# Patient Record
Sex: Female | Born: 1950 | Race: Asian | Hispanic: No | Marital: Married | Smoking: Never smoker
Health system: Southern US, Community
[De-identification: ages and names within clinical notes are randomized; demographics above are authoritative.]

## PROBLEM LIST (undated history)

## (undated) DIAGNOSIS — M8580 Other specified disorders of bone density and structure, unspecified site: Secondary | ICD-10-CM

## (undated) DIAGNOSIS — G5603 Carpal tunnel syndrome, bilateral upper limbs: Secondary | ICD-10-CM

## (undated) DIAGNOSIS — M545 Low back pain, unspecified: Secondary | ICD-10-CM

## (undated) DIAGNOSIS — N289 Disorder of kidney and ureter, unspecified: Secondary | ICD-10-CM

## (undated) DIAGNOSIS — D649 Anemia, unspecified: Secondary | ICD-10-CM

## (undated) DIAGNOSIS — H269 Unspecified cataract: Secondary | ICD-10-CM

## (undated) DIAGNOSIS — K589 Irritable bowel syndrome without diarrhea: Secondary | ICD-10-CM

## (undated) DIAGNOSIS — M1990 Unspecified osteoarthritis, unspecified site: Secondary | ICD-10-CM

## (undated) DIAGNOSIS — I10 Essential (primary) hypertension: Secondary | ICD-10-CM

## (undated) DIAGNOSIS — K219 Gastro-esophageal reflux disease without esophagitis: Secondary | ICD-10-CM

## (undated) DIAGNOSIS — N390 Urinary tract infection, site not specified: Secondary | ICD-10-CM

## (undated) DIAGNOSIS — I708 Atherosclerosis of other arteries: Secondary | ICD-10-CM

## (undated) DIAGNOSIS — T7840XA Allergy, unspecified, initial encounter: Secondary | ICD-10-CM

## (undated) DIAGNOSIS — K449 Diaphragmatic hernia without obstruction or gangrene: Secondary | ICD-10-CM

## (undated) DIAGNOSIS — I872 Venous insufficiency (chronic) (peripheral): Secondary | ICD-10-CM

## (undated) HISTORY — DX: Irritable bowel syndrome, unspecified: K58.9

## (undated) HISTORY — DX: Low back pain, unspecified: M54.50

## (undated) HISTORY — DX: Gastro-esophageal reflux disease without esophagitis: K21.9

## (undated) HISTORY — DX: Unspecified osteoarthritis, unspecified site: M19.90

## (undated) HISTORY — DX: Atherosclerosis of other arteries: I70.8

## (undated) HISTORY — DX: Venous insufficiency (chronic) (peripheral): I87.2

## (undated) HISTORY — DX: Diaphragmatic hernia without obstruction or gangrene: K44.9

## (undated) HISTORY — DX: Unspecified cataract: H26.9

## (undated) HISTORY — DX: Low back pain: M54.5

## (undated) HISTORY — DX: Other specified disorders of bone density and structure, unspecified site: M85.80

## (undated) HISTORY — PX: ABDOMINAL HYSTERECTOMY: SHX81

## (undated) HISTORY — DX: Urinary tract infection, site not specified: N39.0

## (undated) HISTORY — DX: Allergy, unspecified, initial encounter: T78.40XA

## (undated) HISTORY — DX: Carpal tunnel syndrome, bilateral upper limbs: G56.03

## (undated) HISTORY — DX: Essential (primary) hypertension: I10

## (undated) HISTORY — PX: OTHER SURGICAL HISTORY: SHX169

## (undated) HISTORY — DX: Anemia, unspecified: D64.9

---

## 1997-05-02 HISTORY — PX: BREAST REDUCTION SURGERY: SHX8

## 1998-06-02 ENCOUNTER — Other Ambulatory Visit: Admission: RE | Admit: 1998-06-02 | Discharge: 1998-06-02 | Payer: Self-pay | Admitting: *Deleted

## 1999-07-01 ENCOUNTER — Other Ambulatory Visit: Admission: RE | Admit: 1999-07-01 | Discharge: 1999-07-01 | Payer: Self-pay | Admitting: *Deleted

## 1999-07-05 ENCOUNTER — Other Ambulatory Visit: Admission: RE | Admit: 1999-07-05 | Discharge: 1999-07-05 | Payer: Self-pay | Admitting: *Deleted

## 1999-07-05 ENCOUNTER — Encounter (INDEPENDENT_AMBULATORY_CARE_PROVIDER_SITE_OTHER): Payer: Self-pay | Admitting: Specialist

## 2000-06-29 ENCOUNTER — Other Ambulatory Visit: Admission: RE | Admit: 2000-06-29 | Discharge: 2000-06-29 | Payer: Self-pay | Admitting: *Deleted

## 2001-07-06 ENCOUNTER — Other Ambulatory Visit: Admission: RE | Admit: 2001-07-06 | Discharge: 2001-07-06 | Payer: Self-pay | Admitting: *Deleted

## 2001-11-08 ENCOUNTER — Ambulatory Visit (HOSPITAL_COMMUNITY): Admission: RE | Admit: 2001-11-08 | Discharge: 2001-11-08 | Payer: Self-pay | Admitting: Pulmonary Disease

## 2001-11-08 ENCOUNTER — Encounter: Payer: Self-pay | Admitting: Pulmonary Disease

## 2002-07-31 ENCOUNTER — Other Ambulatory Visit: Admission: RE | Admit: 2002-07-31 | Discharge: 2002-07-31 | Payer: Self-pay | Admitting: Obstetrics and Gynecology

## 2003-12-17 ENCOUNTER — Encounter: Payer: Self-pay | Admitting: Gastroenterology

## 2004-03-16 ENCOUNTER — Ambulatory Visit: Payer: Self-pay | Admitting: Gastroenterology

## 2004-11-03 ENCOUNTER — Encounter (INDEPENDENT_AMBULATORY_CARE_PROVIDER_SITE_OTHER): Payer: Self-pay | Admitting: Specialist

## 2004-11-03 ENCOUNTER — Observation Stay (HOSPITAL_COMMUNITY): Admission: RE | Admit: 2004-11-03 | Discharge: 2004-11-04 | Payer: Self-pay | Admitting: Obstetrics and Gynecology

## 2004-11-30 ENCOUNTER — Ambulatory Visit: Payer: Self-pay | Admitting: Pulmonary Disease

## 2004-12-22 ENCOUNTER — Ambulatory Visit: Payer: Self-pay | Admitting: Pulmonary Disease

## 2005-02-09 ENCOUNTER — Ambulatory Visit: Payer: Self-pay | Admitting: Pulmonary Disease

## 2005-04-18 ENCOUNTER — Ambulatory Visit: Payer: Self-pay | Admitting: Pulmonary Disease

## 2005-06-21 ENCOUNTER — Ambulatory Visit (HOSPITAL_BASED_OUTPATIENT_CLINIC_OR_DEPARTMENT_OTHER): Admission: RE | Admit: 2005-06-21 | Discharge: 2005-06-21 | Payer: Self-pay | Admitting: Orthopedic Surgery

## 2005-07-25 ENCOUNTER — Ambulatory Visit: Payer: Self-pay | Admitting: Pulmonary Disease

## 2005-11-18 ENCOUNTER — Ambulatory Visit: Payer: Self-pay | Admitting: Pulmonary Disease

## 2006-05-09 ENCOUNTER — Ambulatory Visit: Payer: Self-pay | Admitting: Pulmonary Disease

## 2006-05-25 ENCOUNTER — Ambulatory Visit: Payer: Self-pay | Admitting: Internal Medicine

## 2006-06-13 ENCOUNTER — Ambulatory Visit: Payer: Self-pay | Admitting: Internal Medicine

## 2006-07-31 ENCOUNTER — Ambulatory Visit: Payer: Self-pay | Admitting: Internal Medicine

## 2006-10-25 ENCOUNTER — Ambulatory Visit: Payer: Self-pay | Admitting: Pulmonary Disease

## 2006-10-26 ENCOUNTER — Ambulatory Visit: Payer: Self-pay | Admitting: Pulmonary Disease

## 2006-10-26 LAB — CONVERTED CEMR LAB
ALT: 23 units/L (ref 0–35)
AST: 27 units/L (ref 0–37)
Albumin: 4 g/dL (ref 3.5–5.2)
Alkaline Phosphatase: 60 units/L (ref 39–117)
BUN: 14 mg/dL (ref 6–23)
Basophils Absolute: 0 10*3/uL (ref 0.0–0.1)
Basophils Relative: 1 % (ref 0.0–1.0)
Bilirubin, Direct: 0.1 mg/dL (ref 0.0–0.3)
CO2: 31 meq/L (ref 19–32)
Calcium: 9.4 mg/dL (ref 8.4–10.5)
Chloride: 109 meq/L (ref 96–112)
Cholesterol: 204 mg/dL (ref 0–200)
Creatinine, Ser: 0.7 mg/dL (ref 0.4–1.2)
Direct LDL: 122 mg/dL
Eosinophils Absolute: 0.1 10*3/uL (ref 0.0–0.6)
Eosinophils Relative: 3 % (ref 0.0–5.0)
GFR calc Af Amer: 112 mL/min
GFR calc non Af Amer: 93 mL/min
Glucose, Bld: 101 mg/dL — ABNORMAL HIGH (ref 70–99)
HCT: 41.2 % (ref 36.0–46.0)
HDL: 47.6 mg/dL (ref >39.0)
Hemoglobin: 14 g/dL (ref 12.0–15.0)
Lymphocytes Relative: 40.9 % (ref 12.0–46.0)
MCHC: 34.1 g/dL (ref 30.0–36.0)
MCV: 92.5 fL (ref 78.0–100.0)
Monocytes Absolute: 0.3 10*3/uL (ref 0.2–0.7)
Monocytes Relative: 7.8 % (ref 3.0–11.0)
Neutro Abs: 1.8 10*3/uL (ref 1.4–7.7)
Neutrophils Relative %: 47.3 % (ref 43.0–77.0)
Platelets: 202 10*3/uL (ref 150–400)
Potassium: 4.5 meq/L (ref 3.5–5.1)
RBC: 4.45 M/uL (ref 3.87–5.11)
RDW: 12 % (ref 11.5–14.6)
Sodium: 144 meq/L (ref 135–145)
TSH: 0.81 microintl units/mL (ref 0.35–5.50)
Total Bilirubin: 1.2 mg/dL (ref 0.3–1.2)
Total CHOL/HDL Ratio: 4.3
Total Protein: 7.1 g/dL (ref 6.0–8.3)
Triglycerides: 88 mg/dL (ref 0–149)
VLDL: 18 mg/dL (ref 0–40)
WBC: 3.8 10*3/uL — ABNORMAL LOW (ref 4.5–10.5)

## 2007-02-12 DIAGNOSIS — D649 Anemia, unspecified: Secondary | ICD-10-CM

## 2007-02-12 DIAGNOSIS — K219 Gastro-esophageal reflux disease without esophagitis: Secondary | ICD-10-CM

## 2007-02-12 DIAGNOSIS — K449 Diaphragmatic hernia without obstruction or gangrene: Secondary | ICD-10-CM

## 2007-02-12 DIAGNOSIS — I872 Venous insufficiency (chronic) (peripheral): Secondary | ICD-10-CM

## 2007-02-12 DIAGNOSIS — I10 Essential (primary) hypertension: Secondary | ICD-10-CM

## 2007-04-24 ENCOUNTER — Ambulatory Visit: Payer: Self-pay | Admitting: Pulmonary Disease

## 2007-04-24 DIAGNOSIS — K589 Irritable bowel syndrome without diarrhea: Secondary | ICD-10-CM

## 2007-04-24 DIAGNOSIS — K649 Unspecified hemorrhoids: Secondary | ICD-10-CM | POA: Insufficient documentation

## 2007-07-04 ENCOUNTER — Ambulatory Visit: Payer: Self-pay | Admitting: Pulmonary Disease

## 2007-07-04 LAB — CONVERTED CEMR LAB
CRP, High Sensitivity: 1 (ref 0.00–5.00)
TSH: 0.67 microintl units/mL (ref 0.35–5.50)

## 2007-07-16 ENCOUNTER — Telehealth (INDEPENDENT_AMBULATORY_CARE_PROVIDER_SITE_OTHER): Payer: Self-pay | Admitting: *Deleted

## 2007-08-28 ENCOUNTER — Telehealth: Payer: Self-pay | Admitting: Pulmonary Disease

## 2007-09-14 ENCOUNTER — Emergency Department (HOSPITAL_COMMUNITY): Admission: EM | Admit: 2007-09-14 | Discharge: 2007-09-14 | Payer: Self-pay | Admitting: Emergency Medicine

## 2007-10-17 ENCOUNTER — Ambulatory Visit: Payer: Self-pay | Admitting: Pulmonary Disease

## 2007-12-24 ENCOUNTER — Telehealth: Payer: Self-pay | Admitting: Internal Medicine

## 2007-12-26 ENCOUNTER — Encounter: Payer: Self-pay | Admitting: Pulmonary Disease

## 2008-01-09 ENCOUNTER — Ambulatory Visit: Payer: Self-pay | Admitting: Internal Medicine

## 2008-01-09 DIAGNOSIS — R1013 Epigastric pain: Secondary | ICD-10-CM

## 2008-01-09 DIAGNOSIS — K3189 Other diseases of stomach and duodenum: Secondary | ICD-10-CM

## 2008-01-25 ENCOUNTER — Ambulatory Visit (HOSPITAL_BASED_OUTPATIENT_CLINIC_OR_DEPARTMENT_OTHER): Admission: RE | Admit: 2008-01-25 | Discharge: 2008-01-25 | Payer: Self-pay | Admitting: Orthopedic Surgery

## 2008-02-05 ENCOUNTER — Ambulatory Visit: Payer: Self-pay | Admitting: Internal Medicine

## 2008-02-06 ENCOUNTER — Encounter: Payer: Self-pay | Admitting: Pulmonary Disease

## 2008-04-17 ENCOUNTER — Ambulatory Visit: Payer: Self-pay | Admitting: Pulmonary Disease

## 2008-07-31 HISTORY — PX: OTHER SURGICAL HISTORY: SHX169

## 2008-10-16 ENCOUNTER — Ambulatory Visit: Payer: Self-pay | Admitting: Pulmonary Disease

## 2008-10-17 ENCOUNTER — Ambulatory Visit: Payer: Self-pay | Admitting: Pulmonary Disease

## 2008-10-20 LAB — CONVERTED CEMR LAB
ALT: 21 units/L (ref 0–35)
AST: 27 units/L (ref 0–37)
Albumin: 4 g/dL (ref 3.5–5.2)
Alkaline Phosphatase: 64 units/L (ref 39–117)
Basophils Relative: 0.5 % (ref 0.0–3.0)
CO2: 30 meq/L (ref 19–32)
Calcium: 9.4 mg/dL (ref 8.4–10.5)
Chloride: 104 meq/L (ref 96–112)
Eosinophils Relative: 2.1 % (ref 0.0–5.0)
Glucose, Bld: 96 mg/dL (ref 70–99)
HDL: 34.6 mg/dL — ABNORMAL LOW (ref >39.00)
Hemoglobin: 13.5 g/dL (ref 12.0–15.0)
Lymphs Abs: 1.5 10*3/uL (ref 0.7–4.0)
Monocytes Absolute: 0.3 10*3/uL (ref 0.1–1.0)
Neutro Abs: 1.6 10*3/uL (ref 1.4–7.7)
Sodium: 142 meq/L (ref 135–145)
Total Protein: 7.1 g/dL (ref 6.0–8.3)

## 2008-11-04 ENCOUNTER — Telehealth (INDEPENDENT_AMBULATORY_CARE_PROVIDER_SITE_OTHER): Payer: Self-pay | Admitting: *Deleted

## 2008-11-10 ENCOUNTER — Encounter: Payer: Self-pay | Admitting: Pulmonary Disease

## 2009-03-12 ENCOUNTER — Ambulatory Visit: Payer: Self-pay | Admitting: Pulmonary Disease

## 2009-04-01 HISTORY — PX: OTHER SURGICAL HISTORY: SHX169

## 2009-04-14 ENCOUNTER — Ambulatory Visit: Payer: Self-pay | Admitting: Pulmonary Disease

## 2009-04-14 ENCOUNTER — Encounter: Admission: RE | Admit: 2009-04-14 | Discharge: 2009-04-14 | Payer: Self-pay | Admitting: General Surgery

## 2009-04-14 DIAGNOSIS — M949 Disorder of cartilage, unspecified: Secondary | ICD-10-CM

## 2009-04-14 DIAGNOSIS — M899 Disorder of bone, unspecified: Secondary | ICD-10-CM | POA: Insufficient documentation

## 2009-06-03 ENCOUNTER — Ambulatory Visit: Payer: Self-pay | Admitting: Internal Medicine

## 2009-06-03 ENCOUNTER — Telehealth: Payer: Self-pay | Admitting: Pulmonary Disease

## 2009-06-08 ENCOUNTER — Telehealth: Payer: Self-pay | Admitting: Pulmonary Disease

## 2009-07-09 ENCOUNTER — Encounter (INDEPENDENT_AMBULATORY_CARE_PROVIDER_SITE_OTHER): Payer: Self-pay | Admitting: *Deleted

## 2009-07-09 ENCOUNTER — Ambulatory Visit: Payer: Self-pay | Admitting: Internal Medicine

## 2009-07-23 ENCOUNTER — Ambulatory Visit: Payer: Self-pay | Admitting: Internal Medicine

## 2009-07-28 ENCOUNTER — Telehealth: Payer: Self-pay | Admitting: Internal Medicine

## 2009-07-29 ENCOUNTER — Ambulatory Visit: Payer: Self-pay | Admitting: Pulmonary Disease

## 2009-07-29 ENCOUNTER — Telehealth (INDEPENDENT_AMBULATORY_CARE_PROVIDER_SITE_OTHER): Payer: Self-pay | Admitting: *Deleted

## 2009-07-30 ENCOUNTER — Ambulatory Visit: Payer: Self-pay | Admitting: Internal Medicine

## 2009-07-30 ENCOUNTER — Telehealth: Payer: Self-pay | Admitting: Internal Medicine

## 2009-08-01 LAB — CONVERTED CEMR LAB
Albumin: 4.5 g/dL (ref 3.5–5.2)
Alkaline Phosphatase: 68 units/L (ref 39–117)
BUN: 11 mg/dL (ref 6–23)
Basophils Absolute: 0 10*3/uL (ref 0.0–0.1)
CO2: 29 meq/L (ref 19–32)
Calcium: 9.6 mg/dL (ref 8.4–10.5)
Creatinine, Ser: 0.7 mg/dL (ref 0.4–1.2)
Glucose, Bld: 92 mg/dL (ref 70–99)
Hemoglobin: 13.5 g/dL (ref 12.0–15.0)
Lipase: 41 units/L (ref 11.0–59.0)
Lymphocytes Relative: 39.4 % (ref 12.0–46.0)
Monocytes Relative: 6.7 % (ref 3.0–12.0)
Neutro Abs: 2.2 10*3/uL (ref 1.4–7.7)
RBC: 4.35 M/uL (ref 3.87–5.11)
RDW: 11.5 % (ref 11.5–14.6)
Total Protein: 8 g/dL (ref 6.0–8.3)
Vit D, 25-Hydroxy: 44 ng/mL (ref 30–89)
WBC: 4.7 10*3/uL (ref 4.5–10.5)

## 2009-09-01 ENCOUNTER — Telehealth: Payer: Self-pay | Admitting: Internal Medicine

## 2009-09-17 ENCOUNTER — Telehealth (INDEPENDENT_AMBULATORY_CARE_PROVIDER_SITE_OTHER): Payer: Self-pay | Admitting: *Deleted

## 2009-10-08 ENCOUNTER — Telehealth: Payer: Self-pay | Admitting: Internal Medicine

## 2009-10-16 ENCOUNTER — Ambulatory Visit: Payer: Self-pay | Admitting: Cardiology

## 2009-10-26 ENCOUNTER — Ambulatory Visit: Payer: Self-pay | Admitting: Pulmonary Disease

## 2009-10-26 DIAGNOSIS — I708 Atherosclerosis of other arteries: Secondary | ICD-10-CM

## 2009-10-27 ENCOUNTER — Encounter: Payer: Self-pay | Admitting: Internal Medicine

## 2009-10-28 ENCOUNTER — Encounter: Payer: Self-pay | Admitting: Internal Medicine

## 2009-10-28 ENCOUNTER — Ambulatory Visit: Payer: Self-pay

## 2009-10-30 ENCOUNTER — Telehealth: Payer: Self-pay | Admitting: Internal Medicine

## 2009-12-02 ENCOUNTER — Encounter: Payer: Self-pay | Admitting: Internal Medicine

## 2009-12-02 ENCOUNTER — Telehealth: Payer: Self-pay | Admitting: Internal Medicine

## 2010-04-12 ENCOUNTER — Telehealth: Payer: Self-pay | Admitting: Internal Medicine

## 2010-04-21 ENCOUNTER — Ambulatory Visit: Payer: Self-pay | Admitting: Pulmonary Disease

## 2010-05-20 ENCOUNTER — Other Ambulatory Visit: Payer: Self-pay | Admitting: Obstetrics

## 2010-05-26 ENCOUNTER — Other Ambulatory Visit: Payer: Self-pay | Admitting: Obstetrics

## 2010-06-03 NOTE — Progress Notes (Signed)
Summary: MEDICATION  Phone Note Call from Patient Call back at [phone]   Caller: Patient Call For: NADEL Summary of Call: NEED TO TALK TO DR NADEL ABOUT SIDE EFFECTS FROM HER MEDICATION Initial call taken by: Rickard Patience,  June 03, 2009 12:05 PM  Follow-up for Phone Call        Pt states she had an implant in her left eye for cataracts, and now she states they think she has a cataract in her right eye. She states her eye MD stated for her to ask SN if her feels any of her meds could be contributing to her eye problems, because he feels she is to young to be having cataracts. Please advise. Carron Curie CMA  June 03, 2009 12:23 PM   Additional Follow-up for Phone Call Additional follow up Details #1::        called and spoke with pt---she stated that her vision is getting worse, blurry most of the time---she looked up the micardis online and this is one of the side effects of this med and she would like to change to avalide or something else.  she also stated that  she is taking the toprol and would like to know if she can take a stronger dose of the torol instead of taking another med??she stated that she will start with a BP log to follow her BP readings.please advise.  thanks Randell Loop CMA  June 04, 2009 10:38 AM     Additional Follow-up for Phone Call Additional follow up Details #2::    called and spoke with pt---she is aware of meds changed---from micardis to avalide 150/12.5 per SN---pt is aware that this has been sent to her pharmacy and she has upcoming appt with SN on march 30 and will bring in log of her BP at that time. Randell Loop CMA  June 04, 2009 11:32 AM   New/Updated Medications: AVALIDE 150-12.5 MG TABS (IRBESARTAN-HYDROCHLOROTHIAZIDE) take one tablet by mouth once daily Prescriptions: AVALIDE 150-12.5 MG TABS (IRBESARTAN-HYDROCHLOROTHIAZIDE) take one tablet by mouth once daily  #30 x 6   Entered by:   Randell Loop CMA   Authorized by:    Michele Mcalpine MD   Signed by:   Randell Loop CMA on 06/04/2009   Method used:   Electronically to        [facility] [address] [phone]* (retail)       [address]       [place], [place]  [address]       Ph: [phone] or [phone]       Fax: [phone]   RxID:   [id number]

## 2010-06-03 NOTE — Progress Notes (Signed)
Summary: mesenteric doppler results/restart Nexium  Medications Added NEXIUM 40 MG CPDR (ESOMEPRAZOLE MAGNESIUM) 1 by mouth once daily in AM 30 mins before breakfast       Phone Note Outgoing Call   Call placed by: Iva Boop MD, Clementeen Graham,  October 30, 2009 4:13 PM Summary of Call: I explained that Doppler US of mesenteric aa was ok she does not have the post-prandial burning abdominal pain now now she is having belching and tasting of bile or food pc to restart Nexium 40 mg daily and let me know how she is in one month will need to reassess weight also    New/Updated Medications: NEXIUM 40 MG CPDR (ESOMEPRAZOLE MAGNESIUM) 1 by mouth once daily in AM 30 mins before breakfast

## 2010-06-03 NOTE — Assessment & Plan Note (Signed)
Summary: 6 month return/mhh   Primary Care Provider:  Alroy Dust, MD   CC:  6 month ROV & review of mult medical problems....  History of Present Illness: 60 y/o asian woman here for a follow up visit... she has mult medical problems as noted below...    ~  July 29, 2009:  she reports a difficult 69mo- saw DrGessner for her chr upper abd discomfort & she reports Nexium w/o benefit, intol Dexilant (made her sleepy)... he did another EGD 3/11- mild gastritis, otherw neg, HPylori neg>> she will try Prilosec OTC vs Prevacid OTC (take Bid before meals)... she would like CAT scan to be sure there is nothing going on in there (CT neg x mild atherosclerosis, DDD). She also c/o mult side effects from her BP meds- intol Avalide w/ photosens reaction even thru her clothes, Micardis affected her vision, and Diovan caused constipation... for now we will try monotherapy w/ Metoprolol 25-50mg  /d (she has tol this xyrs)...   ~  October 26, 2009:  CT Abd 3/11 showed mild atherosclerotic changes in Ao, DDD L5-S1, NAD.Marland Kitchen. subseq CTA by DrGessner 6/11 showed atherosclerotic changes at origin of SMA & Celiac w/o obstruction, & mod dis at origin of IMA which is very diminutive... renal arts OK, Ao is nonaneurysmal, & otherw neg...  her chr abd symptoms sound functional & we discussed trial Align, Activia, Gluten free diet, etc... she indicates that DrGessner may refer her to [facility]...   ~  April 21, 2010:  she tells me that a [place] doctor via web site rec pureed organic cabbage (to strengthen the stomach lining) & she drinks this daily & is feeling better... they also suggested a puppy to help w/ her BP & she has a [place] mix named "Dia Sitter" now & she notes BP doing satis... she is back on Nexium daily & taking a round of Xifaxan per [employer]... we reviewed prev Scans and labs.   Current Problem List:  HYPERTENSION (ICD-401.9) - on TOPROL XL 50mg - 1/2 to 1 tab Daily; & INTOL to Avalide, Micardis, Diovan she says... BP  today on monotherapy = 110/64 & we discussed monitor BP at home & use Metoprolol 25-50mg /d... denies HA, fatigue, visual changes, CP, palipit, dizziness, syncope, dyspnea, edema, etc...   ATHEROSCLEROSIS OF OTHER SPECIFIED ARTERIES (ICD-440.8) - CTAngio 6/11 showed atherosclerotic changes at origin of SMA & Celiac w/o obstruction, mod dis at origin of IMA which is very diminutive, renal arts OK, Ao is nonaneurysmal, & otherw neg exam... we discussed ASA 81mg /d.  VENOUS INSUFFICIENCY (ICD-459.81) - she follows a low sodium diet, elevates legs, & wears support hose as needed...   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 now takes pureed organic cabbage per [place] medicine web site & improved.  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   ~  10/09 f/u w/ DrGessner- EGD showed 2cmHH, otherw neg- she was reassured.  ~  12/10: she remains on Nexium doing well...  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 repeat EGD by DrGessner showed mild gastritis, neg HPylori.  IRRITABLE BOWEL SYNDROME (ICD-564.1) & HEMORRHOIDS (ICD-455.6) - last colon 2/08 w/ small int hems only...   ~  12/10: she  had outpt hem surg by DrIngram...  HX, URINARY INFECTION (ICD-V13.02) - hx urinary symptoms and referred by GYN to Urology w/ cystoscopy 6/09 by DrPeterson= normal... she was tried on [employer] [facility]...   Hx of LOW BACK PAIN - she had prev epid steroid shots in 1996... CT Abd 3/11 showed some DDD in lumbar area.  Hx bilateral CARPAL TUNNEL SYNDROME - she had prev surgery on right- 2/07, and left- 9/09 by DrSypher...  OSTEOPENIA (ICD-733.90) - she takes Calcium, MVI, Vit D... followed by GYN.  ~  BMD 2010 at [facility] showed  TScores -1.8 in Spine, & -1.3 in right FemNeck.  Hx of ANEMIA-NOS (ICD-285.9)  ~  labs 5/09 by GYN showed Hg= 13.5  ~  labs here 6/10 showed Hg= 13.5  ~  labs here 3/11 showed Hg= 13.5   Current Medications (verified): 1)  Metoprolol Succinate 50 Mg  Tb24 (Metoprolol Succinate) .... Take 1/2 To 1 By Mouth Daily 2)  Calcium 500 Mg Tabs (Calcium) .... Take 1 Tablet By Mouth Once A Day 3)  Vitamin D 1000 Unit Tabs (Cholecalciferol) .... Take 1 Tablet By Mouth Once A Day 4)  Nexium 40 Mg Cpdr (Esomeprazole Magnesium) .Marland Kitchen.. 1 By Mouth Once Daily in Am 30 Mins Before Breakfast 5)  Xifaxan 550 Mg Tabs (Rifaximin) .Marland Kitchen.. 1 By Mouth Three Times A Day  Allergies (verified): 1)  ! Penicillin 2)  ! Macrodantin 3)  ! Septra 4)  ! Prevacid 5)  ! * Mult Reported Drug Intolerances>>> 6)  ! * Shellfish  Comments:  Nurse/Medical Assistant: The patient's medications and allergies were reviewed with the patient and were updated in the Medication and Allergy Lists. 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 Hx Bilateral CARPAL TUNNEL SYNDROME - she had prev surgery on right- 2/07 and left- 9/09 by DrSypher... ANEMIA-NOS (ICD-285.9)  Past Surgical History: S/P Hysterectomy S/P bilat carpal tunnel releases- left 9/09, right 2/07 by DrSypher S/P left cataract & lens 4/10 by DrMcCuen S/P hemorhroid surg 12/10 by Lurline Hare  Family History: Reviewed history from 01/09/2008 and no changes required. No FH of Colon Cancer:  Social History: Reviewed history from 01/09/2008 and no changes required. 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  Alcohol Use - no Illicit Drug Use - no Married, has children  Review of Systems      See HPI  The patient denies anorexia, fever, weight loss, weight gain, vision loss, decreased hearing, hoarseness, chest pain, syncope, dyspnea on exertion, peripheral edema, prolonged cough, headaches, hemoptysis, abdominal pain, melena, hematochezia, severe indigestion/heartburn, hematuria, incontinence, muscle weakness, suspicious skin lesions, transient blindness, difficulty walking, depression, unusual weight change, abnormal bleeding, enlarged lymph nodes, and angioedema.    Vital Signs:  Patient profile:   60 year old female Height:      61 inches Weight:      150.13 pounds BMI:     28.47 O2 Sat:      98 % on Room air Temp:     98.1 degrees F oral Pulse rate:   60 / minute BP sitting:   110 / 64  (left arm) Cuff size:   regular  Vitals Entered By: Boone Master CNA/MA (April 21, 2010 2:18 PM)  O2 Flow:  Room air  Physical Exam  Additional Exam:  WD, WN, 60 y/o F in NAD... GENERAL:  Alert & oriented; pleasant & cooperative... HEENT:  [place]/AT, EOM-wnl,  PERRLA, EACs-wax, TMs- not vis, NOSE-clear, THROAT-clear & wnl. NECK:  Supple w/ fairROM; no JVD; normal carotid impulses w/o bruits; no thyromegaly or nodules palpated; no lymphadenopathy. CHEST:  Clear w/o wheezing, rales, rhonchi... HEART:  Regular Rhythm; without murmurs/ rubs/ or gallops... ABDOMEN:  Soft & nontender; normal bowel sounds; no organomegaly or masses detected. EXT: without deformities or arthritic changes; no varicose veins/ +venous insuffic/ no edema. NEURO:  intact w/ no focal deficits noted DERM:  No lesions noted; no rash etc...    Impression & Recommendations:  Problem # 1:  HYPERTENSION (ICD-401.9) BP controlled on monotherapy + "puppy"... Her updated medication list for this problem includes:    Metoprolol Succinate 50 Mg Tb24 (Metoprolol succinate) .Marland Kitchen... Take 1/2 to 1  by mouth daily  Problem # 2:  ATHEROSCLEROSIS OF OTHER SPECIFIED ARTERIES (ICD-440.8) As noted>  reviewed w/ pt & rec for ECASA 81mg /d...  Problem # 3:  GERD (ICD-530.81) GI per DrGessner on Nexium & Xifaxan currently... Her updated medication list for this problem includes:    Nexium 40 Mg Cpdr (Esomeprazole magnesium) .Marland Kitchen... 1 by mouth once daily in am 30 mins before breakfast  Problem # 4:  HX, URINARY INFECTION (ICD-V13.02) Followed by DrPeterson & stable...  Problem # 5:  OSTEOPENIA (ICD-733.90) Followed by GYN...  Problem # 6:  OTHER PROBLEMS AS NOTED>>>  Complete Medication List: 1)  Metoprolol Succinate 50 Mg Tb24 (Metoprolol succinate) .... Take 1/2 to 1 by mouth daily 2)  Nexium 40 Mg Cpdr (Esomeprazole magnesium) .Marland Kitchen.. 1 by mouth once daily in am 30 mins before breakfast 3)  Calcium 500 Mg Tabs (Calcium) .... Take 1 tablet by mouth once a day 4)  Vitamin D 1000 Unit Tabs (Cholecalciferol) .... Take 1 tablet by mouth once a day  Patient Instructions: 1)  Today we updated your med list- see below.... 2)  Continue your current meds the same... 3)  Be sure to eliminate sweets & count your calories to aide in weight reduction.Marland KitchenMarland Kitchen 4)  Call for any problems.Marland KitchenMarland Kitchen 5)  Please schedule a follow-up appointment in 6 months, with fasting blood work on return.

## 2010-06-03 NOTE — Progress Notes (Signed)
Summary: call to patient re: CT results  Medications Added BUSPIRONE HCL 10 MG  TABS (BUSPIRONE HCL) 1/2 tab two times a day x 1 week then 1 tab two times a day ALIGN   CAPS (MISC INTESTINAL FLORA REGULAT) Take one capsule by mouth daily       Phone Note Outgoing Call   Summary of Call: I called re: mild gastritis on EGD path she still feels bloated and burping after meals on antacids and metoprolol She had a CT abd/pelvis this AM per Dr. Kriste Basque labs are ok I will call her after CT back Iva Boop MD, [facility]  July 30, 2009 11:55 AM   Follow-up for Phone Call        left message that I would call back tomorrow Iva Boop MD, [facility]  July 30, 2009 6:58 PM cell # [phone]   Additional Follow-up for Phone Call Additional follow up Details #1::        I reviewed CT results and normal labs from Dr. 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 will start buspirone and Align possible side effects discussed she is to call for appt and see me in 6-8 weeks Iva Boop MD, [facility]  July 31, 2009 5:37 PM     New/Updated Medications: BUSPIRONE HCL 10 MG  TABS (BUSPIRONE HCL) 1/2 tab two times a day x 1 week then 1 tab two times a day ALIGN   CAPS (MISC INTESTINAL FLORA REGULAT) Take one capsule by mouth daily Prescriptions: BUSPIRONE HCL 10 MG  TABS (BUSPIRONE HCL) 1/2 tab two times a day x 1 week then 1 tab two times a day  #60 x 2   Entered and Authorized by:   Iva Boop MD, [facility]   Signed by:   Iva Boop MD, FACG on 07/31/2009   Method used:   Electronically to        [facility] [address] [phone]* (retail)       [address]       [place], [place]  [address]       Ph: [phone] or [phone]       Fax: [phone]   RxID:   [id number]

## 2010-06-03 NOTE — Assessment & Plan Note (Signed)
Summary: NEXIUM IS NOT WORKING ANYMORE/YF    History of Present Illness Visit Type: Follow-up Visit Primary GI MD: Stan Head MD [facility] Primary Provider: Lalla Brothers Requesting Provider: n/a Chief Complaint: GERD History of Present Illness:   60 yo [place] woman with chronic dyspepsia and GERD symptos, maintained on PPI. She shaye Nexium is not effective anymore. She had hemorrhoid surgery Dec 15 Derrell Lolling) and went off Nexium x 5 days because she  was not eating. Her typical regimen was off, not exercising or doing yoga. She restarted workouts she is a bit better.  The problems she was aving when off Nexium and after restarting were belcing pc and she smelled the previously eaten food. she also felt like something caught in her thraot after eating and in epigastrium. Her husband ordered and she started taking organic plum balls 10/day and it also has calcium citrate, ? if better defecation  She ?'s if there is surgical cure for her problems.  She remains concerned about gastric cancer since a friend has had it and "alot of [place]ans get stomach cancer". EGD 2009 ok Was treated for H. pyloti years ago     GI Review of Systems    Reports acid reflux and  belching.      Denies abdominal pain, bloating, chest pain, dysphagia with liquids, dysphagia with solids, heartburn, loss of appetite, nausea, vomiting, vomiting blood, weight loss, and  weight gain.        Denies anal fissure, black tarry stools, change in bowel habit, constipation, diarrhea, diverticulosis, fecal incontinence, heme positive stool, hemorrhoids, irritable bowel syndrome, jaundice, light color stool, liver problems, rectal bleeding, and  rectal pain.    Current Medications (verified): 1)  Adult Aspirin Low Strength 81 Mg  Tbdp (Aspirin) .... Take 1 Tablet By Mouth Once A Day 2)  Metoprolol Succinate 50 Mg  Tb24 (Metoprolol Succinate) .... Take 1/2 Tab Once Daily 3)  Micardis Hct 40-12.5 Mg  Tabs (Telmisartan-Hctz)  .... Take 1 Tab Daily.Marland KitchenMarland Kitchen 4)  Fish Oil 1000 Mg  Caps (Omega-3 Fatty Acids) .Marland Kitchen.. 1 Cap Daily 5)  Nexium 40 Mg  Cpdr (Esomeprazole Magnesium) .... Take 1 Tablet By Mouth Once A Day... 6)  [facility] Calcium-600 600 Mg  Tabs (Calcium Carbonate) .... Take 2 Tablets Once Daily 7)  Multivitamins   Tabs (Multiple Vitamin) .... Take 1 Tablet By Mouth Once A Day  Allergies: 1)  ! Penicillin 2)  ! Macrodantin 3)  ! Septra 4)  ! Prevacid 5)  ! * Shellfish  Past History:  Past Medical History: Reviewed history from 04/14/2009 and no changes required. HYPERTENSION (ICD-401.9) VENOUS INSUFFICIENCY (ICD-459.81) HIATAL HERNIA (ICD-553.3) GERD (ICD-530.81) IRRITABLE BOWEL SYNDROME (ICD-564.1) HEMORRHOIDS (ICD-455.6) HX, URINARY INFECTION (ICD-V13.02) OSTEOPENIA (ICD-733.90) Hx of LOW BACK PAIN - she had prev epid steroid shots in 1996... Hx Bilateral CARPAL TUNNEL SYNDROME - she had prev surgery on right- 2/07 and left- 9/09 by DrSypher... ANEMIA-NOS (ICD-285.9)     Past Surgical History: Reviewed history from 04/14/2009 and no changes required. S/P Hysterectomy S/P bilat carpal tunnel releases- left 9/09, right 2/07 by DrSypher S/P left cataract & lens 4/10 by DrMcCuen S/P hemorroid surg 12/10 by Lurline Hare  Family History: Reviewed history from 01/09/2008 and no changes required. No FH of Colon Cancer:  Social History: Reviewed history from 01/09/2008 and no changes required. Occupation: [employer] Patient has never smoked.  Alcohol Use - no Illicit Drug Use - no Married, has children  Vital Signs:  Patient profile:   59  year old female Height:      61 inches Weight:      148.38 pounds BMI:     28.14 Pulse rate:   68 / minute Pulse rhythm:   regular BP sitting:   106 / 68  (left arm) Cuff size:   regular  Vitals Entered By: June McMurray CMA Duncan Dull) (June 03, 2009 11:01 AM)  Physical Exam  General:  Well developed, well nourished, Asian woman in no acute distress. Lungs:   Clear throughout to auscultation. Heart:  Regular rate and rhythm; no murmurs, rubs,  or bruits. Abdomen:  Soft, nontender and nondistended. No masses, hepatosplenomegaly or hernias noted. Normal bowel sounds. Psych:  Alert and cooperative. Normal mood and affect.   Impression & Recommendations:  Problem # 1:  DYSPEPSIA, CHRONIC (ICD-536.8) Assessment Deteriorated Functional dyspepsia info given and se was reassured that this is unlikely to represent cancer due to repetitive nature and lack of change in symptoms, plus she has had EGD 3 times with last in 2009, no weight loss (variable pattern without any trend). Could consider cecking to see if H. pylori was eradicated but would need to come off PPi x 2 weeks. Do not think EGd indicated at this tie.  Problem # 2:  GERD (ICD-530.81) Assessment: Deteriorated She has ?ed if surgery an option. Though she sounds like shehas GERD, coulld "just" be unctional dyspepsia and fundoplication would not help and likely would wosen that. continue PPi but to try Kapidex saples (aware of headace with Prevacid - but she wants to try) Would need to prove GERD with pH probeif she were to pursue surgery.  Problem # 3:  IRRITABLE BOWEL SYNDROME (ICD-564.1) Assessment: Comment Only Does not appear to be actibe with respect to bowels ut suspect functional upper GI sxs.  Patient Instructions: 1)  Samples given of Dexilant. 2)  Please call our office in one month to let us know if the Dexilant is working.  Ask to speak with Judeth Cornfield. 3)  Copy sent to : Alroy Dust, MD 4)  The medication list was reviewed and reconciled.  All changed / newly prescribed medications were explained.  A complete medication list was provided to the patient / caregiver.

## 2010-06-03 NOTE — Progress Notes (Signed)
Summary: meds - needs CT-A   Phone Note Call from Patient Call back at [phone]   Caller: Patient Call For: Dr. Leone Payor Reason for Call: Talk to Nurse Summary of Call: finished Sucralfate... symptoms are the same  Initial call taken by: Vallarie Mare,  October 08, 2009 8:56 AM  Follow-up for Phone Call        Left message for patient to call back Darcey Nora RN, [facility]  October 08, 2009 10:19 AM  She has finished the rx of sucracrafate.  She says this did not improve her symptoms at all.  She c/o burning sensation after meals in her stomach.   She is eating a lot of cabbage to "help with her stomach lining".  She wants to persue additional testing.  Please advise.   Follow-up by: Darcey Nora RN, CGRN,  October 08, 2009 1:45 PM  Additional Follow-up for Phone Call Additional follow up Details #1::        message left that I would call again Iva Boop MD, [facility]  October 08, 2009 4:08 PM   New Problems: WEIGHT LOSS (ICD-783.21) ABDOMINAL PAIN, UPPER (ICD-789.09)   Additional Follow-up for Phone Call Additional follow up Details #2::    spoke to her'still with post-prandial burning upper abdominal pain and losing weight has altered her eating I reviewed CT with radiologist and mesenteric vessel origins look ok but not conclusive will order CT angio of abd/pelvis re: upper abdominal pain, weight loss she had BUN/creat ok 3/30  Additional Follow-up for Phone Call Additional follow up Details #3:: Details for Additional Follow-up Action Taken: message left to call back to schedule CT angio Additional Follow-up by: Teryl Lucy RN,  October 09, 2009 2:52 PM  New Problems: WEIGHT LOSS (ICD-783.21) ABDOMINAL PAIN, UPPER (ICD-789.09)   Patient  aware of CT angio scheduled for 10/16/09 1:00 at [facility] Darcey Nora RN, [facility]  October 12, 2009 10:50 AM

## 2010-06-03 NOTE — Assessment & Plan Note (Signed)
Summary: 20M DEXILANT F-UP/YF    History of Present Illness Visit Type: Follow-up Visit Primary GI MD: Stan Head MD [facility] Primary Provider: Alroy Dust, MD  Requesting Provider: n/a Chief Complaint: GERD  History of Present Illness:   60 yo Asain woman with chronic upper abdominal pain and discomfort. The Dexilant made her sleepy so she went back on Nexium but still has epigastric burning and fullness. Dexilant  did not help her abdominal pain either.On empty stomach it is sour, gassy. Avoiding spicy foods, on a bland diet but still symptomatic. She ?'s if she has H. pylori. she was treated for it in the past.   GI Review of Systems      Denies abdominal pain, acid reflux, belching, bloating, chest pain, dysphagia with liquids, dysphagia with solids, heartburn, loss of appetite, nausea, vomiting, vomiting blood, weight loss, and  weight gain.        Denies anal fissure, black tarry stools, change in bowel habit, constipation, diarrhea, diverticulosis, fecal incontinence, heme positive stool, hemorrhoids, irritable bowel syndrome, jaundice, light color stool, liver problems, rectal bleeding, and  rectal pain.    Current Medications (verified): 1)  Metoprolol Succinate 50 Mg  Tb24 (Metoprolol Succinate) .... Take 1/2 Tab Once Daily 2)  Diovan Hct 160-12.5 Mg Tabs (Valsartan-Hydrochlorothiazide) .... Take 1 Tablet By Mouth Once A Day 3)  Cvs Calcium-600 600 Mg  Tabs (Calcium Carbonate) .... Take 2 Tablets Once Daily 4)  Nexium 40 Mg Cpdr (Esomeprazole Magnesium) .... One Tablet By Mouth Once Daily  Allergies (verified): 1)  ! Penicillin 2)  ! Macrodantin 3)  ! Septra 4)  ! Prevacid 5)  ! * Shellfish  Past History:  Past Medical History: Reviewed history from 04/14/2009 and no changes required. HYPERTENSION (ICD-401.9) VENOUS INSUFFICIENCY (ICD-459.81) HIATAL HERNIA (ICD-553.3) GERD (ICD-530.81) IRRITABLE BOWEL SYNDROME (ICD-564.1) HEMORRHOIDS (ICD-455.6) HX, URINARY  INFECTION (ICD-V13.02) OSTEOPENIA (ICD-733.90) Hx of LOW BACK PAIN - she had prev epid steroid shots in 1996... Hx Bilateral CARPAL TUNNEL SYNDROME - she had prev surgery on right- 2/07 and left- 9/09 by DrSypher... ANEMIA-NOS (ICD-285.9)     Past Surgical History: S/P Hysterectomy S/P bilat carpal tunnel releases- left 9/09, right 2/07 by DrSypher S/P left cataract & lens 4/10 by DrMcCuen S/P hemorhroid surg 12/10 by Lurline Hare  Family History: Reviewed history from 01/09/2008 and no changes required. No FH of Colon Cancer:  Social History: Reviewed history from 01/09/2008 and no changes required. Occupation: [employer] Patient has never smoked.  Alcohol Use - no Illicit Drug Use - no Married, has children  Review of Systems       denies anxiety or stressors appetite ok weight down over time by about 1-0# in several mos  Vital Signs:  Patient profile:   60 year old female Height:      61 inches Weight:      146 pounds BMI:     27.69 BSA:     1.65 Pulse rate:   64 / minute Pulse rhythm:   regular BP sitting:   110 / 64  (left arm) Cuff size:   regular  Vitals Entered By: Ok Anis CMA (July 09, 2009 4:00 PM)  Physical Exam  General:  Well developed, well nourished, Asian woman in no acute distress.   Impression & Recommendations:  Problem # 1:  DYSPEPSIA, CHRONIC (ICD-536.8) Assessment Unchanged Dexilant did not help and was sleepy. Nexium not working. Some early satiety and some weight loss. Last EGD 2009. Will repeat to look for H.  pylori, ? development of neoplasia and hopefully to also reassure. / buspirone if EGD negative  Problem # 2:  GERD (ICD-530.81) Assessment: Unchanged on Nexium but ? if its GERD vs NUD.  Problem # 3:  EPIGASTRIC PAIN (ICD-789.06) Assessment: Unchanged  Orders: EGD (EGD) Risks, benefits,and indications of endoscopic procedure(s) were reviewed with the patient and all questions answered.  Patient Instructions: 1)   We will see you at your procedure on 07/23/09. 2)  Please hold your Nexium until your procedure. 3)  You may take 2 Tums (up to four times a day) as needed for heartburn. 4)  [facility] Patient Information Guide given to patient.  5)  Upper Endoscopy brochure given.  6)  The medication list was reviewed and reconciled.  All changed / newly prescribed medications were explained.  A complete medication list was provided to the patient / caregiver.

## 2010-06-03 NOTE — Assessment & Plan Note (Signed)
Summary: ROV/ MBW   Primary Care Provider:  Alroy Dust, MD   CC:  3 month ROV & review of medical problems....  History of Present Illness: 60 y/o oriental woman here for a follow up visit... she has mult medical problems as noted below...    ~  she had an eval from her GYN in May09- doing well... they did routine labs and Hg= 13.5;  Chem's all normal;  TSH= 0.83;  B12= 938;  VitD= 35... she was rec to take 1500mg  calcium and 1000 u VitD OTC...  ~  she had a Urologic eval from DrPeterson 6/09 w/ cystoscopy that she reports was neg- she had been referred by GYN to Urology, tried on Vesicare 10mg /d...  ~  Sep09:  she had left carpal tunnel release by DrSypher...  ~  Oct09:  she had GI f/u w/ DrGessner- she has a stomach cancer phobia (see below) and he did an EGD to reassure her- 2cmHH, otherw negative... she takes Nexium daily for reflux symptoms and this works well...  ~  Dec09:  doing satis now... she still wishes that there was some surg etc that would elim her acid so she didn't have to take the Nexium... feels well, BP well controlled...   ~  Jun10:  good 6 months- feeling well & Nexium working satis... occas queezy stomach but no pain, N/ V, reflux symptoms, etc... BP well controlled... had left cataract & lens by DrMcCuen in Apr10.  ~  Dec10:  she's had a good 6 months- sched for hem surg by DrIngram this week... otherw stable- no new complaints or concerns >> surg went well, no complic.   ~  July 29, 2009:  she reports a difficult 24mo- saw DrGessner for her chr upper abd discomfort & she reports Nexium w/o benefit, intol Dexilant (made her sleepy)... he did another EGD 3/11- mild gastritis, otherw neg, HPylori neg>> she will try Prilosec OTC vs Prevacid OTC (take Bid before meals)... she would like CAT scan to be sure there is nothing going on in there... She also c/o mult side effects from her BP meds- intol Avalide w/ photosens reaction even thru her clothes, Micardis affected her  vision, and Diovan caused constipation... for now we will try monotherapy w/ Metoprolol 25-50mg  /d (she has tol this xyrs)...   Current Problem List:  HYPERTENSION (ICD-401.9) - on TOPROL XL 50mg - 1/2 tabDaily; & INTOL to Avalide, Micardis, Diovan she says... BP today on monotherapy = 110/72 & we discussed monitor BP at home & use Metoprolol 25-50mg /d... denies HA, fatigue, visual changes, CP, palipit, dizziness, syncope, dyspnea, edema, etc...   VENOUS INSUFFICIENCY (ICD-459.81) - she follows a low sodium diet, elevates legs, & wears support hose as needed...   HIATAL HERNIA (ICD-553.3) & GERD (ICD-530.81) - **see above** she is very concerned about a friend who was diagnosed w/ gastric cancer- he apparently was a smoker but had no greater symptoms than she has had & she wonders about screening EGD's for early detection...  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   ~  10/09 f/u w/ DrGessner- EGD showed 2cmHH, otherw neg- she was reassured.  ~  12/10: she remains on Nexium doing well...  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 repeat EGD by DrGessner showed mild gastritis, neg HPylori.  IRRITABLE BOWEL SYNDROME (ICD-564.1) & HEMORRHOIDS (ICD-455.6) - last colon 2/08 w/ small int hems only...   ~  12/10: she had outpt  hem surg by DrIngram...  HX, URINARY INFECTION (ICD-V13.02)  Hx of LOW BACK PAIN - she had prev epid steroid shots in 1996...  Hx bilateral CARPAL TUNNEL SYNDROME - she had prev surgery on right- 2/07 and left- 9/09 by DrSypher...  OSTEOPENIA (ICD-733.90) - she takes Calcium, MVI, Vit D... followed by GYN.  ~  BMD 2010 at [facility] showed TScores -1.8 in Spine, & -1.3 in right FemNeck.  Hx of ANEMIA-NOS (ICD-285.9)  ~  labs 5/09 by GYN showed Hg= 13.5  ~  labs here 6/10 showed Hg= 13.5  ~  labs here 3/11 showed Hg=    Allergies: 1)  ! Penicillin 2)  ! Macrodantin 3)  ! Septra 4)  ! Prevacid 5)  ! * Mult Reported Drug  Intolerances>>> 6)  ! * Shellfish  Comments:  Nurse/Medical Assistant: The patient's medications and allergies were reviewed with the patient and were updated in the Medication and Allergy Lists.  Past History:  Past Medical History:  HYPERTENSION (ICD-401.9) VENOUS INSUFFICIENCY (ICD-459.81) HIATAL HERNIA (ICD-553.3) GERD (ICD-530.81) IRRITABLE BOWEL SYNDROME (ICD-564.1) HEMORRHOIDS (ICD-455.6) HX, URINARY INFECTION (ICD-V13.02) OSTEOPENIA (ICD-733.90) Hx of LOW BACK PAIN - she had prev epid steroid shots in 1996... Hx Bilateral CARPAL TUNNEL SYNDROME - she had prev surgery on right- 2/07 and left- 9/09 by DrSypher... ANEMIA-NOS (ICD-285.9)     Past Surgical History: S/P Hysterectomy S/P bilat carpal tunnel releases- left 9/09, right 2/07 by DrSypher S/P left cataract & lens 4/10 by DrMcCuen S/P hemorhroid surg 12/10 by Lurline Hare  Family History: Reviewed history from 01/09/2008 and no changes required. No FH of Colon Cancer:  Social History: Reviewed history from 01/09/2008 and no changes required. Occupation: [employer] Patient has never smoked.  Alcohol Use - no Illicit Drug Use - no Married, has children  Review of Systems      See HPI       The patient complains of abdominal pain.  The patient denies anorexia, fever, weight loss, weight gain, vision loss, decreased hearing, hoarseness, chest pain, syncope, dyspnea on exertion, peripheral edema, prolonged cough, headaches, hemoptysis, melena, hematochezia, severe indigestion/heartburn, hematuria, incontinence, muscle weakness, suspicious skin lesions, transient blindness, difficulty walking, depression, unusual weight change, abnormal bleeding, enlarged lymph nodes, and angioedema.    Vital Signs:  Patient profile:   60 year old female Height:      61 inches Weight:      146.13 pounds BMI:     27.71 O2 Sat:      99 % on Room air Temp:     97.1 degrees F oral Pulse rate:   67 / minute BP sitting:   110 /  72  (left arm) Cuff size:   regular  Vitals Entered By: Randell Loop CMA (July 29, 2009 12:00 PM)  O2 Sat at Rest %:  99 O2 Flow:  Room air CC: 3 month ROV & review of medical problems... Is Patient Diabetic? No Pain Assessment Patient in pain? no      Comments meds updated today   Physical Exam  Additional Exam:  WD, WN, 60 y/o F in NAD... GENERAL:  Alert & oriented; pleasant & cooperative... HEENT:  [place]/AT, EOM-wnl,  PERRLA, EACs-clear, TMs-wnl, NOSE-clear, THROAT-clear & wnl. NECK:  Supple w/ fairROM; no JVD; normal carotid impulses w/o bruits; no thyromegaly or nodules palpated; no lymphadenopathy. CHEST:  Clear w/o wheezing, rales, rhonchi... HEART:  Regular Rhythm; without murmurs/ rubs/ or gallops... ABDOMEN:  Soft & nontender; normal bowel sounds; no organomegaly or masses  detected. EXT: without deformities or arthritic changes; no varicose veins/ +venous insuffic/ no edema. NEURO:  intact w/ no focal deficits noted DERM:  No lesions noted; no rash etc...    MISC. Report  Procedure date:  07/29/2009  Findings:      We have ordered LABS & CT ABD & PELVIS... pending.  SN    Impression & Recommendations:  Problem # 1:  ? FUNCTIONAL GI COMPLAINTS>>> Extensive GI eval by DrGessner reviewed... mult somatic complaints, and she has a cancer phobia as noted... perhaps getting a CT abd will provide her w/ the last measure of reassurance that she needs... REC>  CT Abd/ Pelvis + f/u labs > we will call her w/ results. For her symptoms > try Prev15 vs Prilosec20 taken 30 min before meals, and Simethacone preps for gas...   Problem # 2:  HYPERTENSION (ICD-401.9) She stopped all the ARBs due to ?side effect? she thinks...  Taking Metop 50- 1/2 daily & watch BP at home... The following medications were removed from the medication list:    Diovan Hct 160-12.5 Mg Tabs (Valsartan-hydrochlorothiazide) .Marland Kitchen... Take 1 tablet by mouth once a day Her updated medication list for  this problem includes:    Metoprolol Succinate 50 Mg Tb24 (Metoprolol succinate) .Marland Kitchen... Take 1/2 tab once daily  Orders: TLB-BMP (Basic Metabolic Panel-BMET) (80048-METABOL) TLB-Hepatic/Liver Function Pnl (80076-HEPATIC) TLB-CBC Platelet - w/Differential (85025-CBCD) TLB-TSH (Thyroid Stimulating Hormone) (84443-TSH) TLB-Amylase (82150-AMYL) TLB-Lipase (83690-LIPASE) T-Vitamin D (25-Hydroxy) (60454-09811)  Problem # 3:  OSTEOPENIA (ICD-733.90) Osteop & hx LBP... stable, continue Vit D 1000 u daily...  Problem # 4:  OTHER MEDICAL PROBLEMS AS NOTED>>>  Complete Medication List: 1)  Metoprolol Succinate 50 Mg Tb24 (Metoprolol succinate) .... Take 1/2 tab once daily 2)  Buspirone Hcl 10 Mg Tabs (Buspirone hcl) .... 1/2 tab two times a day x 1 week then 1 tab two times a day 3)  Align Caps (Misc intestinal flora regulat) .... Take one capsule by mouth daily  Other Orders: Radiology Referral (Radiology)  Patient Instructions: 1)  We decided to treat your BP w METOPROLOL 50mg - 1/2 to 1 tab daily as monotherapy... stop the previous Diovan Rx. 2)  For your STOMACH:  try the OTC PPI therapy= PRILOSEC or PREVACID >> take one twice daily (30 min before meals is the best time to take these).Marland KitchenMarland Kitchen 3)  For the GAS:  use SIMETHACONE >> eg. MYLICON, Mylanta GAS, Bean-O, PHAZYME, etc... use it up to 4 timmes daily for gas symptoms.Marland KitchenMarland Kitchen 4)  Today we did your follow up blood work, and we will schedule a CT Scan of your Abdomen for further eval... 5)  We will call you with these results when avail.Marland KitchenMarland Kitchen 6)  Please schedule a follow-up appointment in 3-4 months.

## 2010-06-03 NOTE — Progress Notes (Signed)
Summary: prescript  Phone Note Call from Patient Call back at [phone]   Caller: Patient Call For: nadel Summary of Call: insurance need dr Kriste Basque to  authorized avalide not generic Initial call taken by: Rickard Patience,  June 08, 2009 10:27 AM  Follow-up for Phone Call        Avalide is on backorder and pt is requesting that this medication be changed. She is asking for Micardis. Please advise.Michel Bickers [facility]  June 08, 2009 11:16 AM  Additional Follow-up for Phone Call Additional follow up Details #1::        i called and spoke with pt---she stated that the pharmacy told her that the avalide was on back order and it needed a prior auth for approval from her insurance company.  the pharmacy told me that they did not know when they would be able to get it.  per SN   change the avalide to diovan hct 160/12.5mg  once daily and i called and spoke with pt and she is aware of the change in meds and voiced her understanding and she is aware of med change. Randell Loop CMA  June 08, 2009 2:25 PM     New/Updated Medications: DIOVAN HCT 160-12.5 MG TABS (VALSARTAN-HYDROCHLOROTHIAZIDE) Take 1 tablet by mouth once a day Prescriptions: DIOVAN HCT 160-12.5 MG TABS (VALSARTAN-HYDROCHLOROTHIAZIDE) Take 1 tablet by mouth once a day  #30 x 6   Entered by:   Randell Loop CMA   Authorized by:   Michele Mcalpine MD   Signed by:   Randell Loop CMA on 06/08/2009   Method used:   Electronically to        [facility] [address] [phone]* (retail)       [address]       [place], [place]  [address]       Ph: [phone] or [phone]       Fax: [phone]   RxID:   [id number]

## 2010-06-03 NOTE — Progress Notes (Signed)
Summary: Metoprolol RX  Phone Note From Pharmacy   Caller: [facility] [address] [phone]* Call For: nadel  Summary of Call: has a question about her furosmide needing refills due to increase in how pt is taking it [phone] Initial call taken by: Lacinda Axon,  Sep 17, 2009 11:09 AM  Follow-up for Phone Call        At last OV with SN on 07/29/2009 pt was instructed to stop Diovan and cahnge Metoprolol 50mg  to 1/2 ro 1 tab by mouth daily. Verbal RX given to pharmacist. They were asking for 90 day supply. I will update pt's med list to reflect the change that was made at last OV. Follow-up by: Michel Bickers CMA,  Sep 17, 2009 11:38 AM    New/Updated Medications: METOPROLOL SUCCINATE 50 MG  TB24 (METOPROLOL SUCCINATE) Take 1/2 to 1 by mouth daily Prescriptions: METOPROLOL SUCCINATE 50 MG  TB24 (METOPROLOL SUCCINATE) Take 1/2 to 1 by mouth daily  #90 x 3   Entered by:   Michel Bickers CMA   Authorized by:   Michele Mcalpine MD   Signed by:   Michel Bickers CMA on 09/17/2009   Method used:   Telephoned to ...       [facility] [address] [phone]* (retail)       [address]       [place], [place]  [address]       Ph: [phone] or [phone]       Fax: [phone]   RxID:   [id number]

## 2010-06-03 NOTE — Progress Notes (Signed)
Summary: Triage   Phone Note Call from Patient Call back at [phone]   Caller: Patient Call For: Dr. Leone Payor Reason for Call: Talk to Nurse Summary of Call: finished her Nexium med. and would like to discuss Initial call taken by: Karna Christmas,  December 02, 2009 2:54 PM  Follow-up for Phone Call        nexium has helped some.  Not completely better.  She has gained 2 lbs.  Eating yogurt and ice cream.   She is not completely better.  She is requesting a prev-pack or pylera , she knows that she was h. pylori negative, but she would like to try a tx anyway.  Please advise Follow-up by: Darcey Nora RN, CGRN,  December 02, 2009 3:04 PM  Additional Follow-up for Phone Call Additional follow up Details #1::        I am not going to do that. I would try Xifaxan....thought we had done that but do not see it on med list please doublecheck that Additional Follow-up by: Iva Boop MD, Clementeen Graham,  December 02, 2009 6:59 PM     Appended Document: Triage I can't see where you ever gave her xifaxan. Please advise Darcey Nora RN, [facility]  December 03, 2009 7:51 AM   Appended Document: Triage options now are Xifaxan 550 mg three times a day x 10 days or go to [facility] for eval  Appended Document: Triage patient aware, new rx sent.  patient will call back if xifaxan doesn't help for a [facility] referral.   Clinical Lists Changes  Medications: Added new medication of XIFAXAN 550 MG TABS (RIFAXIMIN) 1 by mouth three times a day - Signed Rx of XIFAXAN 550 MG TABS (RIFAXIMIN) 1 by mouth three times a day;  #30 x 0;  Signed;  Entered by: Darcey Nora RN, CGRN;  Authorized by: Iva Boop MD, FACG;  Method used: Electronically to [facility] [phone]*, [address], [place], [place], [place]  [address], Ph: [phone] or [phone], Fax: [phone]    Prescriptions: XIFAXAN 550 MG TABS (RIFAXIMIN) 1 by mouth three times a day  #30 x 0   Entered by:   Darcey Nora RN, CGRN   Authorized by:   Iva Boop MD, [facility]   Signed by:   Darcey Nora RN, CGRN on 12/04/2009   Method used:   Electronically to        [facility] [address] [phone]* (retail)       [address]       [place], [place]  [address]       Ph: [phone] or [phone]       Fax: [phone]   RxID:   [id number]

## 2010-06-03 NOTE — Progress Notes (Signed)
Summary: Xifaxan Rx  Medications Added XIFAXAN 550 MG TABS (RIFAXIMIN) 1 by mouth three times a day       Phone Note Other Incoming   Summary of Call: She has told me symptoms of bloating and post-prandial discomfort in abdomen have returned after resolution with Xifaxan in August. I have rerxed Xifaxan, please let her know. F/u as needed if that does not work again Iva Boop MD, [facility]  April 12, 2010 1:50 PM   Follow-up for Phone Call        Patient aware.  She is asked to call back if she has any further problems. Follow-up by: Darcey Nora RN, CGRN,  April 12, 2010 2:08 PM    New/Updated Medications: XIFAXAN 550 MG TABS (RIFAXIMIN) 1 by mouth three times a day Prescriptions: XIFAXAN 550 MG TABS (RIFAXIMIN) 1 by mouth three times a day  #30 x 0   Entered and Authorized by:   Iva Boop MD, [facility]   Signed by:   Iva Boop MD, [facility] on 04/12/2010   Method used:   Electronically to        [facility] [address] [phone]* (retail)       [address]       [place], [place]  [address]       Ph: [phone] or [phone]       Fax: [phone]   RxID:   [id number]

## 2010-06-03 NOTE — Progress Notes (Signed)
Summary: results request   Phone Note Call from Patient Call back at [phone]   Caller: Patient Call For: Dr. Leone Payor Reason for Call: Lab or Test Results Summary of Call: would like EGD results Initial call taken by: Vallarie Mare,  July 28, 2009 1:48 PM  Follow-up for Phone Call        Pt. aware that when Dr.Bodin Gorka reviews the path. report he will have [facility] mail pt. a letter with the results. Pt. instructed to call back as needed.  Follow-up by: Laureen Ochs LPN,  July 28, 2009 1:53 PM

## 2010-06-03 NOTE — Letter (Signed)
Summary: EGD Instructions  [facility]  [address] [place], [place] [address]   Phone: [phone]  Fax: [phone]       [name]    1950-06-04    MRN: [id number]       Procedure Day Dorna Bloom: Lenor Coffin, 07/23/09     Arrival Time: 2:00 PM     Procedure Time: 3:00 PM     Location of Procedure:                    _X_ [facility] (4th Floor)   PREPARATION FOR ENDOSCOPY   On THURSDAY, 07/23/09 THE DAY OF THE PROCEDURE:  1.   No solid foods, milk or milk products are allowed after midnight the night before your procedure.  2.   Do not drink anything colored red or purple.  Avoid juices with pulp.  No orange juice.  3.  You may drink clear liquids until 1:00 PM, which is 2 hours before your procedure.                                                                                                CLEAR LIQUIDS INCLUDE: Water Jello Ice Popsicles Tea (sugar ok, no milk/cream) Powdered fruit flavored drinks Coffee (sugar ok, no milk/cream) Gatorade Juice: apple, white grape, white cranberry  Lemonade Clear bullion, consomm, broth Carbonated beverages (any kind) Strained chicken noodle soup Hard Candy   MEDICATION INSTRUCTIONS  Unless otherwise instructed, you should take regular prescription medications with a small sip of water as early as possible the morning of your procedure.                 OTHER INSTRUCTIONS  You will need a responsible adult at least 59 years of age to accompany you and drive you home.   This person must remain in the waiting room during your procedure.  Wear loose fitting clothing that is easily removed.  Leave jewelry and other valuables at home.  However, you may wish to bring a book to read or an iPod/MP3 player to listen to music as you wait for your procedure to start.  Remove all body piercing jewelry and leave at home.  Total time from sign-in until discharge is approximately 2-3 hours.  You should  go home directly after your procedure and rest.  You can resume normal activities the day after your procedure.  The day of your procedure you should not:   Drive   Make legal decisions   Operate machinery   Drink alcohol   Return to work  You will receive specific instructions about eating, activities and medications before you leave.    The above instructions have been reviewed and explained to me by   _______________________    I fully understand and can verbalize these instructions _____________________________ Date _________

## 2010-06-03 NOTE — Progress Notes (Signed)
Summary: ct scan  Phone Note Call from Patient Call back at [phone]   Caller: Patient Call For: nadel Summary of Call: want to know if gall bladder can be checked when having ct scan tomorrow. Initial call taken by: Rickard Patience,  July 29, 2009 4:05 PM  Follow-up for Phone Call        called and spoke with pt.  pt scheduled for CT of abd and pelvis tomorrow.  informed her that her gallbladder will be included in the scan.  pt verbalized understanding. Arman Filter LPN  July 29, 2009 4:18 PM

## 2010-06-03 NOTE — Miscellaneous (Signed)
Summary: Orders Update  Clinical Lists Changes  Orders: Added new Test order of Mesenteric (Mesenteric) - Signed

## 2010-06-03 NOTE — Procedures (Signed)
Summary: Upper Endoscopy  Patient: [name] Note: All result statuses are Final unless otherwise noted.  Tests: (1) Upper Endoscopy (EGD)   EGD Upper Endoscopy       DONE     [facility]     [address]     [place], [place]  [address]           ENDOSCOPY PROCEDURE REPORT           PATIENT:  [name], [name]  MR#:  [id number]     BIRTHDATE:  1950-06-03, 58 yrs. old  GENDER:  female           ENDOSCOPIST:  Iva Boop, MD, [facility]           PROCEDURE DATE:  07/23/2009     PROCEDURE:  EGD with biopsy     ASA CLASS:  Class II     INDICATIONS:  epigastric pain           MEDICATIONS:   Fentanyl 50 mcg IV, Versed 6 mg IV     TOPICAL ANESTHETIC:  Exactacain Spray           DESCRIPTION OF PROCEDURE:   After the risks benefits and     alternatives of the procedure were thoroughly explained, informed     consent was obtained.  The LB GIF-H180 [id number] endoscope was     introduced through the mouth and advanced to the second portion of     the duodenum, without limitations.  The instrument was slowly     withdrawn as the mucosa was fully examined.     <<PROCEDUREIMAGES>>           Mild gastritis was found. Mottled, erythematous mucosa and red     spots. Multiple biopsies were obtained and sent to pathology.     Otherwise the examination was normal. Z-line at 40 cm.     Retroflexed views revealed no abnormalities.    The scope was then     withdrawn from the patient and the procedure completed.           COMPLICATIONS:  None           ENDOSCOPIC IMPRESSION:     1) Mild gastritis - biopsied     2) Otherwise normal examination     RECOMMENDATIONS:     1) Await biopsy results           REPEAT EXAM:  In for as needed.           Iva Boop, MD, [facility]           CC:  The Patient           n.     eSIGNED:   Iva Boop at 07/23/2009 03:33 PM           Ledell Noss, [id number]  Note: An exclamation mark (!) indicates a result that was not dispersed into the  flowsheet. Document Creation Date: 07/23/2009 3:34 PM _______________________________________________________________________  (1) Order result status: Final Collection or observation date-time: 07/23/2009 15:26 Requested date-time:  Receipt date-time:  Reported date-time:  Referring Physician:   Ordering Physician: Stan Head [phone]) Specimen Source:  Source: Launa Grill Order Number: [id number] Lab site:

## 2010-06-03 NOTE — Progress Notes (Signed)
Summary: discuss meds and testing  Medications Added NEXIUM 40 MG CPDR (ESOMEPRAZOLE MAGNESIUM) 1 by mouth once daily SUCRALFATE 1 GM TABS (SUCRALFATE) take 1 tablet 4 times a day before meals and at bedtime, may dissolve tablet in 1 tablespoon of water and drink as a slurry       Phone Note Call from Patient Call back at Work Phone [phone]   Caller: Patient Call For: Dr. Leone Payor Reason for Call: Talk to Nurse Summary of Call: would like to discuss medications before sch'ling f/u appt... also would like to discuss galbladder function test Initial call taken by: Vallarie Mare,  Sep 01, 2009 1:40 PM  Follow-up for Phone Call        no machine and no answer I will continue to try and reach the patient Darcey Nora RN, [facility]  Sep 01, 2009 2:10 PM  Patient  c/o burning sensation between her breast area and gas and belching.  Patient says that the buspirone hasn't changed her symptoms and she has made some dietary changes and no improvement.  She is asking for a gallbladder function test.  She doesn't feel her symptoms are anxiety.  Please advise. 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 she should restart Nexium at this time 40 mg daily but I will callher today or tomorrow also. Iva Boop MD, [facility]  Sep 02, 2009 1:46 PM     Additional Follow-up for Phone Call Additional follow up Details #2::    Left message for patient to call back Darcey Nora RN, [facility]  Sep 02, 2009 2:25 PM  Patient notified to resume Nexium and she will wait for your call. Follow-up by: Darcey Nora RN, CGRN,  Sep 03, 2009 1:42 PM  Additional Follow-up for Phone Call Additional follow up Details #3:: Details for Additional Follow-up Action Taken: she did not start Nexium and buspirone did not  help asking if she could have H. pylori despite negative bx Told her still sounds like functional dyspepsia dietary changes and reduced intake have led to weight loss she is down to #142 so far all studies without significant abnormalities 1) stop buspirone and will not start Nexium 2) start sucralfated 1 g qid 3) trial of Xifaxan for gas/bloat seems reasonable, will check for samples vs. Rx 4) also consider GI cokctail as needed depending upon response to sucralfate Iva Boop MD, [facility]  Sep 04, 2009 5:29 PM   New/Updated Medications: NEXIUM 40 MG CPDR (ESOMEPRAZOLE MAGNESIUM) 1 by mouth once daily SUCRALFATE 1 GM TABS (SUCRALFATE) take 1 tablet 4 times a day before meals and at bedtime, may dissolve tablet in 1 tablespoon of water and drink as a slurry Prescriptions: SUCRALFATE 1 GM TABS (SUCRALFATE) take 1 tablet 4 times a day before meals and at bedtime, may dissolve tablet in 1 tablespoon of water and drink as a slurry  #120 x 1   Entered and Authorized by:   Iva Boop MD, [facility]   Signed by:   Iva Boop MD, FACG on 09/04/2009   Method used:   Electronically to        [facility] [address] [phone]* (retail)       [address]       [place], [place]  [address]       Ph: [phone] or [phone]       Fax: [phone]   RxID:   [id number]

## 2010-06-03 NOTE — Assessment & Plan Note (Signed)
Summary: 3 months/ mbw   Primary Care Provider:  Alroy Dust, MD   CC:  3 month ROV & review....  History of Present Illness: 60 y/o asian woman here for a follow up visit... she has mult medical problems as noted below...    ~  Oct09:  she had GI f/u w/ DrGessner- she has a stomach cancer phobia (see below) and he did an EGD to reassure her- 2cmHH, otherw negative... she takes Nexium daily for reflux symptoms and this works well...  ~  Dec09:  doing satis now... she still wishes that there was some surg etc that would elim her acid so she didn't have to take the Nexium... feels well, BP well controlled...   ~  Jun10:  good 6 months- feeling well & Nexium working satis... occas queezy stomach but no pain, N/ V, reflux symptoms, etc... BP well controlled... had left cataract & lens by DrMcCuen in Apr10.  ~  Dec10:  she's had a good 6 months- sched for hem surg by DrIngram this week... otherw stable- no new complaints or concerns >> surg went well, no complic.   ~  July 29, 2009:  she reports a difficult 62mo- saw DrGessner for her chr upper abd discomfort & she reports Nexium w/o benefit, intol Dexilant (made her sleepy)... he did another EGD 3/11- mild gastritis, otherw neg, HPylori neg>> she will try Prilosec OTC vs Prevacid OTC (take Bid before meals)... she would like CAT scan to be sure there is nothing going on in there... She also c/o mult side effects from her BP meds- intol Avalide w/ photosens reaction even thru her clothes, Micardis affected her vision, and Diovan caused constipation... for now we will try monotherapy w/ Metoprolol 25-50mg  /d (she has tol this xyrs)...   ~  October 26, 2009:  CT Abd 3/11 showed mild atherosclerotic changes in Ao, DDD L5-S1, NAD.Marland Kitchen. subseq CTA by DrGessner 6/11 showed atherosclerotic changes at origin of SMA & Celiac w/o obstruction, & mod dis at origin of IMA which is very diminutive... renal arts OK, Ao is nonaneurysmal, & otherw neg...  her chr abd  symptoms sound functional & we discussed trial Align, Activia, Gluten free diet, etc... she indicates that DrGessner may refer her to [facility]...   Current Problem List:  HYPERTENSION (ICD-401.9) - on TOPROL XL 50mg - 1/2 to 1 tab Daily; & INTOL to Avalide, Micardis, Diovan she says... BP today on monotherapy = 110/64 & we discussed monitor BP at home & use Metoprolol 25-50mg /d... denies HA, fatigue, visual changes, CP, palipit, dizziness, syncope, dyspnea, edema, etc...   ATHEROSCLEROSIS OF OTHER SPECIFIED ARTERIES (ICD-440.8) - CTAngio 6/11 showed atherosclerotic changes at origin of SMA & Celiac w/o obstruction, mod dis at origin of IMA which is very diminutive, renal arts OK, Ao is nonaneurysmal, & otherw neg exam...  VENOUS INSUFFICIENCY (ICD-459.81) - she follows a low sodium diet, elevates legs, & wears support hose as needed...   HIATAL HERNIA (ICD-553.3) & GERD (ICD-530.81) - **see above** she is very concerned about a friend who was diagnosed w/ gastric cancer- he apparently was a smoker but had no greater symptoms than she has had & she wonders about screening for early detection...  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   ~  10/09 f/u w/ DrGessner- EGD showed 2cmHH, otherw neg- she was reassured.  ~  12/10: she remains on Nexium doing well...  ~  3/11:  states Nexium not working & Administrator, arts.Marland KitchenMarland Kitchen  repeat EGD by DrGessner showed mild gastritis, neg HPylori.  IRRITABLE BOWEL SYNDROME (ICD-564.1) & HEMORRHOIDS (ICD-455.6) - last colon 2/08 w/ small int hems only...   ~  12/10: she had outpt hem surg by DrIngram...  HX, URINARY INFECTION (ICD-V13.02) - hx urinary symptoms and referred by GYN to Urology w/ cystoscopy 6/09 by DrPeterson= normal... she was tried on [employer] Rx...   Hx of LOW BACK PAIN - she had prev epid steroid shots in 1996...  Hx bilateral CARPAL TUNNEL SYNDROME - she had prev surgery on right- 2/07, and left- 9/09 by DrSypher...  OSTEOPENIA (ICD-733.90)  - she takes Calcium, MVI, Vit D... followed by GYN.  ~  BMD 2010 at [facility] showed TScores -1.8 in Spine, & -1.3 in right FemNeck.  Hx of ANEMIA-NOS (ICD-285.9)  ~  labs 5/09 by GYN showed Hg= 13.5  ~  labs here 6/10 showed Hg= 13.5  ~  labs here 3/11 showed Hg= 13.5   Preventive Screening-Counseling & Management  Alcohol-Tobacco     Smoking Status: never  Allergies: 1)  ! Penicillin 2)  ! Macrodantin 3)  ! Septra 4)  ! Prevacid 5)  ! * Mult Reported Drug Intolerances>>> 6)  ! * Shellfish  Comments:  Nurse/Medical Assistant: The patient's medications and allergies were reviewed with the patient and were updated in the Medication and Allergy Lists.  Past History:  Past Medical History: HYPERTENSION (ICD-401.9) ATHEROSCLEROSIS OF OTHER SPECIFIED ARTERIES (ICD-440.8) VENOUS INSUFFICIENCY (ICD-459.81) HIATAL HERNIA (ICD-553.3) GERD (ICD-530.81) IRRITABLE BOWEL SYNDROME (ICD-564.1) HEMORRHOIDS (ICD-455.6) HX, URINARY INFECTION (ICD-V13.02) OSTEOPENIA (ICD-733.90) Hx of LOW BACK PAIN - she had prev epid steroid shots in 1996... Hx Bilateral CARPAL TUNNEL SYNDROME - she had prev surgery on right- 2/07 and left- 9/09 by DrSypher... ANEMIA-NOS (ICD-285.9)  Past Surgical History: S/P Hysterectomy S/P bilat carpal tunnel releases- left 9/09, right 2/07 by DrSypher S/P left cataract & lens 4/10 by DrMcCuen S/P hemorhroid surg 12/10 by Lurline Hare  Family History: Reviewed history from 01/09/2008 and no changes required. No FH of Colon Cancer:  Social History: Reviewed history from 01/09/2008 and no changes required. Occupation: [employer] Patient has never smoked.  Alcohol Use - no Illicit Drug Use - no Married, has children  Review of Systems      See HPI       The patient complains of abdominal pain and severe indigestion/heartburn.  The patient denies anorexia, fever, weight loss, weight gain, vision loss, decreased hearing, hoarseness, chest pain,  syncope, dyspnea on exertion, peripheral edema, prolonged cough, headaches, hemoptysis, melena, hematochezia, hematuria, incontinence, muscle weakness, suspicious skin lesions, transient blindness, difficulty walking, depression, unusual weight change, abnormal bleeding, enlarged lymph nodes, and angioedema.    Vital Signs:  Patient profile:   60 year old female Height:      61 inches Weight:      138 pounds BMI:     26.17 O2 Sat:      99 % on Room air Temp:     98.0 degrees F oral Pulse rate:   65 / minute BP sitting:   110 / 64  (left arm) Cuff size:   regular  Vitals Entered By: Randell Loop CMA (October 26, 2009 11:52 AM)  O2 Flow:  Room air CC: 3 month ROV & review... Is Patient Diabetic? No Pain Assessment Patient in pain? no      Comments rechecked BP--110/68 left with a different cuff   Physical Exam  Additional Exam:  WD, WN, 60 y/o F in  NAD.Marland KitchenMarland Kitchen GENERAL:  Alert & oriented; pleasant & cooperative... HEENT:  Media/AT, EOM-wnl,  PERRLA, EACs-wax, TMs- not vis, NOSE-clear, THROAT-clear & wnl. NECK:  Supple w/ fairROM; no JVD; normal carotid impulses w/o bruits; no thyromegaly or nodules palpated; no lymphadenopathy. CHEST:  Clear w/o wheezing, rales, rhonchi... HEART:  Regular Rhythm; without murmurs/ rubs/ or gallops... ABDOMEN:  Soft & nontender; normal bowel sounds; no organomegaly or masses detected. EXT: without deformities or arthritic changes; no varicose veins/ +venous insuffic/ no edema. NEURO:  intact w/ no focal deficits noted DERM:  No lesions noted; no rash etc...    MISC. Report  Procedure date:  10/26/2009  Findings:      DATA REVIEWED:  ~  Prev EMR notes & labs...  ~  CT Abd & Pelvis 07/30/09...  ~  CT Angio 10/16/09...   Impression & Recommendations:  Problem # 1:  HYPERTENSION (ICD-401.9) Controlled on the monotherapy... Her updated medication list for this problem includes:    Metoprolol Succinate 50 Mg Tb24 (Metoprolol succinate) .Marland Kitchen... Take  1/2 to 1 by mouth daily  Problem # 2:  ATHEROSCLEROSIS OF OTHER SPECIFIED ARTERIES (ICD-440.8) See CTA results... rec ASA 81mg /d, & primary risk reduction strategy...  Problem # 3:  DYSPEPSIA, CHRONIC (ICD-536.8) On-going eval by [employer]-  we discussed the nature of functional vs structural abnormalities in medicine... she has been using a juice made from organic cabbages, per her friend... we also discussed Align, Activia, & Gluten free diet...  Problem # 4:  HX, URINARY INFECTION (ICD-V13.02) Hx voiding prob & urinary symptoms... prev eval DrPeterson...  Problem # 5:  OTHER MEDICAL PROBLEMS AS NOTED>>>  Complete Medication List: 1)  Metoprolol Succinate 50 Mg Tb24 (Metoprolol succinate) .... Take 1/2 to 1 by mouth daily 2)  Calcium 500 Mg Tabs (Calcium) .... Take 1 tablet by mouth once a day 3)  Vitamin D 1000 Unit Tabs (Cholecalciferol) .... Take 1 tablet by mouth once a day  Patient Instructions: 1)  Today we updated your med list- see below.... 2)  We discussed trying several things to help your GI functional condition:  ALIGN probiotic, ACTIVIA yogurt, Gluten free diet, & let me know how the organic cabbage juice is working... 3)  Call for any problems.Marland KitchenMarland Kitchen 4)  Please schedule a follow-up appointment in 6 months.

## 2010-09-14 NOTE — Op Note (Signed)
[name], [name]                     ACCOUNT NO.:  [id number]   MEDICAL RECORD NO.:  [id number]          PATIENT TYPE:  AMB   LOCATION:  DSC                          FACILITY:  [facility]   PHYSICIAN:  Katy Fitch. Sypher, M.D. 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   OPERATION:  Release of left transcarpal ligament and release of left  long finger A1 pulley.   OPERATING SURGEON:  Katy Fitch. Sypher, MD   ASSISTANT:  Marveen Reeks Dasnoit, PA-C   ANESTHESIA:  General by LMA.   SUPERVISING ANESTHESIOLOGIST:  Bedelia Person, MD   INDICATIONS:  Mahika Vanvoorhis is a 60 year old woman well acquainted with  our practice.  She is status post prior right carpal tunnel release.  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   She is referred by Dr. Kriste Basque for followup evaluation of left hand  numbness and locking of her left long finger.  She has a history of  stenosing tenosynovitis and carpal tunnel syndrome unresponsive to  nonoperative measures.   After informed consent, she is brought to the operating room at this  time.  She is noted preoperatively to be allergic to PENICILLIN and  SULFA.  Her preoperative lab work also showed a borderline elevated  potassium.   PROCEDURE:  Lannie Heaps is brought to the operating room and placed in  supine position on the operating table.   Following the induction of general anesthesia by LMA technique, the left  arm was prepped with Betadine soap solution and sterilely draped.  A  pneumatic tourniquet was applied proximal of brachium.   On exsanguination  of left arm with an Esmarch bandage, arterial  tourniquet was inflated to 220 mmHg.  The procedure commenced with a  short incision in the line of the ring finger in the palm.  Subcutaneous  tissues were carefully divided.  The common sensory branch of the median  nerve was identified and followed back to the transcarpal ligament.  The  carpal canal was found with a Penfield 4 elevator followed by separation  of the median nerve from the deep surface of the transcarpal ligament.  The ligament was released along its ulnar border extending into the  distal forearm.   This widely opened the carpal canal.  No mass or other predicaments were  noted.   Bleeding points along the margin of the released ligament were  electrocauterized with bipolar current followed by repair of the skin  with intradermal 3-0 Prolene suture.   Attention was then directed to the left long finger.  The A1 pulley was  identified by palpation.  A short transverse incision was fashioned  directly over the pulley.  Subcutaneous tissues were carefully divided  taking care to gently retract the radial and ulnar neurovascular bundles  and the common digital vessels.  The pulley was split with scalpel and  the release extended with scissors proximally and distally.  The  profundus superficialis tendons were delivered and found be otherwise  healthy.   Full passive motion of fingers recovered without residual triggering.   This wound was repaired with intradermal 3-0 Prolene suture.   A compressive dressing was applied with Xeroflo sterile gauze and a  volar plaster splint to both protect the incision and immobilize the  wrist.   There were no apparent complications.      Katy Fitch Sypher, M.D.  Electronically Signed     RVS/MEDQ  D:  01/25/2008  T:  01/25/2008  Job:  [id number]   cc:   Lonzo Cloud. Kriste Basque, MD

## 2010-09-17 NOTE — Op Note (Signed)
NAMEJACOBI, [name]                     ACCOUNT NO.:  [id number]   MEDICAL RECORD NO.:  [id number]          PATIENT TYPE:  AMB   LOCATION:  DSC                          FACILITY:  [facility]   PHYSICIAN:  Katy Fitch. Sypher, M.D. DATE OF BIRTH:  02/10/1951   DATE OF PROCEDURE:  06/21/2005  DATE OF DISCHARGE:                                 OPERATIVE REPORT   PREOP DIAGNOSIS:  Bilateral carpal tunnel syndrome unresponsive to  nonoperative measures.   POSTOP DIAGNOSIS:  Bilateral carpal tunnel syndrome unresponsive to  nonoperative measures.   OPERATIONS:  Release of right transverse carpal ligament and injection of  left ulnar bursa.   OPERATING SURGEON:  Molly Maduro Sypher   ASSISTANT:  Molly Maduro Dasnoit PA-C.   ANESTHESIA:  General by LMA, supervising anesthesiologist is Dr. Gelene Mink.   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   She presented for evaluation of stenosing tenosynovitis and bilateral hand  numbness.   She had had prior steroid injection into her left ring finger by another  orthopedic surgeon and had had episodes of carpal tunnel syndrome, treated  with splinting and activity modification.   Clinical examination revealed signs of bilateral carpal tunnel syndrome.  Electrodiagnostic studies were completed by Dr. Johna Roles confirming  bilateral carpal tunnel syndrome right worse than left.   Due to failure respond to nonoperative measures, she is brought to the  operating at this time for release of her right transverse carpal ligament  and injection of her left ulnar bursa while under anesthesia.   PROCEDURE:  [name] is brought to the operating room and placed in  supine position on the table.   Following induction general anesthesia by LMA technique, the left arm was  prepped with alcohol and the ulnar bursa injected with the fingers in  position of flexion in line of the ring finger with a mixture of 1 mL of 1%  plain lidocaine and 1 mL of Depo-Medrol 40 milligrams per mL a total volume  of 1 mL of Depo-Medrol.   During injection the fingers extended. This appeared to be technically  satisfactory injection of the ulnar bursa.   Attention directed to the right hand.   The arm was prepped with Betadine soap solution, sterilely draped. A  pneumatic tourniquet was applied proximal right brachium.   Following exsanguination of right arm with an Esmarch bandage, the arterial  tourniquet was inflated to 220 mmHg. The procedure commenced with a short  incision in the line of the ring finger in the palm. The subcutaneous  tissues were carefully divided to reveal palmar fascia. This was split  longitudinally to reveal the common sensory branch of the median nerve.   These were followed back transverse carpal ligament which was carefully  isolated from the median nerve.   The ligament was released along its ulnar border extending into the distal  forearm. This widely opened carpal canal. No masses or other predicaments  were noted.   Bleeding points along the margin of the released ligament were  electrocauterized bipolar current followed by repair the skin with  intradermal 3-0 Prolene suture.   A compressive dressing applied with a volar plaster splint maintaining the  wrist in 5 degrees of dorsiflexion.      Katy Fitch Sypher, M.D.  Electronically Signed     RVS/MEDQ  D:  06/21/2005  T:  06/22/2005  Job:  [id number]

## 2010-09-17 NOTE — Assessment & Plan Note (Signed)
HEALTHCARE                         GASTROENTEROLOGY OFFICE NOTE   NAME:[name], [name]                          MRN:          [id number]  DATE:05/25/2006                            DOB:          1950-08-27    CHIEF COMPLAINT:  Abdominal pain, question needs Nexium.   HISTORY:  [name] is a 60 year old [place] woman who has previously been  cared for by Dr. Jarold Motto and asked to switch to me.  About a month ago  or so she was having a lot of crampy lower quadrant abdominal pain.  A  few months back or longer she noted a change in caliber of her bowels  with finger thin stools.  She had no trouble defecating and no bleeding  but the stools have changed.  She also complains of a lot of gas and  bloating and distention at times and once she passes flatus she feels  better but it can be quite painful.  It sounds like Dr. Kriste Basque, gave her  some Levsin SL to try and that has helped.  Overall, she is much better.  She denies weight loss, fever, or chills.  She is discussing her  previous problems with anemia which were fixed by her hysterectomy.  She  has had some ridging of the nails problems she went to a dermatologist  about.  She is also questioning whether she needs to continue to take  Nexium for heartburn issues.  EGD in the past has demonstrated a hiatal  hernia.  In the past she has also had H. pylori treated.  There has  never been any complicated reflux disease demonstrated on EGD.  A  colonoscopy in 2005, demonstrated some internal hemorrhoids but was  otherwise clear, that was in August, that was her first screening exam.   MEDICATIONS:  1. Calcium 1200 mg daily.  2. Vitamin C daily.  3. Aspirin 81 mg daily.  4. Nexium 40 mg daily.  5. Metoprolol 25 mg a day (half a 50 mg tablet).  6. Avalide 150/12.5 mg daily.   Note, in the past she has tried to stop Nexium but if she goes just a  day she gets terrible heartburn.   DRUG ALLERGIES:   PENICILLIN.   PAST MEDICAL HISTORY:  1. Hysterectomy for bleeding and anemia problems, Dr. Billy Coast.  2. Chronic recurrent urticaria.  3. History of migraine headaches, resolved after a hysterectomy.  4. Right carpal tunnel release.   FAMILY HISTORY:  Notable for strokes.   REVIEW OF SYSTEMS:  As above.   PHYSICAL EXAMINATION:  GENERAL:  A pleasant, well-developed, well-  nourished, [place] woman.  VITAL SIGNS:  Weight 151 pounds, pulse 64, blood pressure 112/70.  EYES:  Anicteric.  ABDOMEN:  Soft with some mild lower quadrant tenderness on the left  without organomegaly or mass.  RECTAL:  In the presence of female nursing staff, shows mildly tender  rectal exam.  No hemorrhoids, no mass.  Hemoccult negative brown stool.   ASSESSMENT:  1. Change in bowels with change in stool caliber, left lower quadrant  pain.  She thinks it may have started when she had some blood      pressure medications adjusted.  Perhaps it is related to adhesions      and her prior hysterectomy.  Colorectal neoplasia should be      excluded.  2. Gas symptoms.  She does eat a lot of cabbage and roughage and we      talked about that and a handout was given.  3. Gastroesophageal reflux disease, doing well.  Question whether she      really needs chronic proton pump inhibitor.  She has not had      complicated disease.  It would be reasonable to try to come off.   PLAN:  1. I have written out a taper for her Nexium to see if she can do      that.  If it is not successful, she should go back on that to treat      her chronic symptomatic GERD.  2. Continue Levsin SL as needed.  3. Try to change the amount of gas-forming foods.  4. Schedule colonoscopy to exclude colorectal neoplasia as a cause of      her change in bowel habits.   Further plans pending clinical course.   Risks, benefits, and indications of colonoscopy explained.  She  understands and agrees to proceed.     Iva Boop,  MD,FACG  Electronically Signed    CEG/MedQ  DD: 05/25/2006  DT: 05/25/2006  Job #: [id number]   cc:   Lonzo Cloud. Kriste Basque, MD

## 2010-09-17 NOTE — H&P (Signed)
[name], [name]                     ACCOUNT NO.:  [id number]   MEDICAL RECORD NO.:  [id number]          PATIENT TYPE:  AMB   LOCATION:  [facility]                           FACILITY:  [facility]   PHYSICIAN:  Lenoard Aden, M.D.DATE OF BIRTH:  November 13, 1950   DATE OF ADMISSION:  DATE OF DISCHARGE:                                HISTORY & PHYSICAL   CHIEF COMPLAINT:  Menometrorrhagia with secondary anemia.   HISTORY OF PRESENT ILLNESS:  The patient is a 60 year old Asian female G2 P2  with symptomatic anemia with a hemoglobin of 7.5, now supplemented with iron  up to a hemoglobin of 11 with irregular bleeding, symptomatic fibroids, and  desire for definitive therapy.  She has a medical history remarkable for  hypertension and is currently on Toprol, verapamil, and Nexium.   ALLERGIES:  She has allergies to PENICILLIN.   PAST SURGICAL HISTORY:  She has a noncontributory surgical history.   FAMILY HISTORY:  She has a noncontributory family history.   OBSTETRIC HISTORY:  She has an obstetric history remarkable for two  uncomplicated vaginal, three pregnancies, one abortion, and two  uncomplicated vaginal deliveries.  She has a history of a breast reduction  in 1999.   SOCIAL HISTORY:  She is a nonsmoker and nondrinker.  She denies domestic or  physical violence.   PHYSICAL EXAMINATION:  She is a well-developed, well-nourished Asian female  in no acute distress.  HEENT normal.  Lungs are clear.  Heart:  Regular  rhythm.  Abdomen is soft and nontender.  Pelvic exam reveals uterus to be 8-  10 week size and anteflexed.  No adnexal masses appreciated.  Bilateral  normal ovaries are appreciated by ultrasound.  Extremities reveal no cords.  Neurologic exam is nonfocal.   IMPRESSION:  Menometrorrhagia with secondary anemia, nonresponsive to  hormonal therapy with previous hemoglobin of 7.6, now on iron therapy.   PLAN:  Plan is to proceed with diagnostic laparoscopy, laparoscopically  assisted  vaginal hysterectomy, possible total abdominal hysterectomy,  ovarian conservation.  Risks of anesthesia, infection, bleeding, injury to  abdominal organs, and need for repair discussed with delayed versus  immediate complications to include bowel and bladder injury noted.  The  patient acknowledges and wishes to proceed.       RJT/MEDQ  D:  11/03/2004  T:  11/03/2004  Job:  [id number]   cc:   [facility]

## 2010-09-17 NOTE — Op Note (Signed)
[name], [name]                     ACCOUNT NO.:  [id number]   MEDICAL RECORD NO.:  [id number]          PATIENT TYPE:  AMB   LOCATION:  SDC                           [facility]   PHYSICIAN:  Lenoard Aden, M.D.DATE OF BIRTH:  1951-04-25   DATE OF PROCEDURE:  11/03/2004  DATE OF DISCHARGE:                                 OPERATIVE REPORT   PREOPERATIVE DIAGNOSES:  1.  Menometrorrhagia with secondary anemia.  2.  Symptomatic uterine fibroids.   POSTOPERATIVE DIAGNOSES:  1.  Menometrorrhagia with secondary anemia.  2.  Symptomatic uterine fibroids.  3.  Enterocele.  4.  Pelvic adhesions.   PROCEDURE:  LAVH, McCall culdoplasty, lysis of adhesions.   SURGEON:  Lenoard Aden, M.D.   ASSISTANT:  Chester Holstein. Earlene Plater, M.D.   ANESTHESIA:  General.   ESTIMATED BLOOD LOSS:  350 cc.   COMPLICATIONS:  None.   DRAINS:  Foley.   COUNTS:  Correct.   Patient to recovery in good condition.   SPECIMENS:  Uterus and cervix to pathology.   BRIEF OPERATIVE NOTE:  Being apprised of the risks of anesthesia, infection,  bleeding, injury to abdominal organs, and need for repair, the patient was  brought to the operating room, where she was administered a general  anesthetic without complications.  Prepped and draped in the usual sterile  fashion.  Catheterized, Foley catheter placed.  After achieving adequate  anesthesia, dilute Marcaine solution was placed in the area of the  infraumbilical incision after a Hulka tenaculum was placed per vagina.  At  this time, a Veress needle was placed.  Opening pressure of -2 was noted.  4  liters of CO2 were insufflated without difficulty.  Trocar placed  atraumatically.  Pictures taken.  Normal liver, gallbladder bed.  Normal  appendiceal area.  Bilateral tubal ligation sites were noted.  Bilateral  normal ovaries.  There were adhesions of the left sigmoid mesentery to the  left side, the left adnexa, and the left pelvic sidewall.  Two 5 mm  trocar  sites were placed in the midaxillary line suprapubicly, halfway between the  symphysis pubis and the umbilicus.  Using transillumination, dilute Marcaine  solution placed.  These adhesions on the left side were lysed sharply along  the pelvic sidewall.  Ureters were identified bilaterally, and the Harmonic  scalpel was used to divide the round ligaments bilaterally.  The tubo-  ovarian round ligament complexes were divided without difficulty.  Using the  minimum setting on the Harmonic scalpel, bladder flap was developed sharply  and skeletonized off the lower uterine segment.  The uterine vessels were  skeletonized bilaterally and noted to be pulsatile.  Ureters previously  noted during the process.  The cardinal and broad ligament complexes were  ligated using the Harmonic scalpel without difficulty.  Irrigation was  accomplished.  Good hemostasis was noted.  CO2 was released, and evidence  revealed no evidence of bleeding.  At this time, attention was turned to the  vaginal portion of the procedure, whereby the cervicovaginal junction was  circumscribed using pitressin  solution and circumscribed using  electrocautery.  Bladder flap was developed sharply and anterior cul-de-sac  entry made without difficulty.  Posterior cul-de-sac entry made sharply as  well.  Uterosacral ligament complexes were bilaterally clamped using a  Heaney clamp and suture ligated, transfixed to the vaginal cuff.  At this  time, ligature was used to transect the rest of the cardinal ligament  complex, and the uterine vessels were bilaterally divided using the  ligature.  At this time, good hemostasis was noted.  The cervix was  amputated without difficulty.  Minimal bleeding noted, and then the wedge-  shaped portion of the uterus was excised posteriorly and anteriorly.  Specimen was then collapsed and removed.  Good hemostasis was noted.  Before  closure of the vagina, McCall culdoplasty suture was placed  using a 0  Monocryl suture after identifying a posterior enterocele.  This was tied,  and good hemostasis was noted.  The vaginal cuff was then closed side-to-  side using a 0 Vicryl suture without difficulty.  Packing was placed.  Attention was turned to the abdominal portion of the procedure, whereby  there was a large amount of blood in the cul-de-sac.  This area was  irrigated, and all blood clots were subsequently removed.  There was a small  area of bleeding in the midportion of the vaginal cuff which was cauterized  using Kleppinger bipolar cautery.  Good hemostasis was noted.  There was  then an area along the lower pole of the left ovary which was found to be  bleeding.  This are was grasped using a tripolar cautery, and the lower pole  of the ovary was elevated and pulled medially.  Ureter was noted in the  midline.  Iliac vessels were noted to be pulsatile.  This area was  cauterized using the tripolar, and good hemostasis was noted.  Irrigation  was accomplished.  CO2 was released, and good hemostasis persisted at this  time.  At this time, good hemostasis was noted, and all instruments were  removed under direct visualization.  CO2 was released.  Abdominal incision  closed using 0 Vicryl.  Dermabond placed on all incisions.  The patient  tolerated the procedure well and was transferred to the recovery room in  good condition.       RJT/MEDQ  D:  11/03/2004  T:  11/03/2004  Job:  [id number]

## 2010-09-17 NOTE — Assessment & Plan Note (Signed)
[facility]                         GASTROENTEROLOGY OFFICE NOTE   NAME:[name], [name]                          MRN:          [id number]  DATE:07/31/2006                            DOB:          08-Jul-1950    CHIEF COMPLAINT:  Followup of reflux, abdominal pain.   Ms. Lacomb had a colonoscopy on June 13, 2006.  She had small internal  hemorrhoids, was otherwise negative.  Since that time, she did have a  spell of constipation and straining and saw a small amount of bright red  blood per rectum.  She tried to taper off Nexium, but her reflux came  back after an attempted taper, and she is taking it about every other  day, with control of her symptoms, plus diet modification.  Endoscopy in  2005 showed no evidence of Barrett's.  She had a small hiatal hernia.  She is concerned about the development of esophageal cancer, which is a  real but very rare risk, and she had a normal EGD with respect of the  mucosa, i.e. no Barrett's or other precancerous lesions in 2005.  She  has no dysphagia.  Levsin SL is working well for her intermittent left  lower quadrant pain which is probable irritable bowel versus adhesions  or both.  Her other medications are listed and reviewed  in the chart.   PHYSICAL EXAMINATION:  VITAL SIGNS:  Weight 153 pounds, pulse 80, blood  pressure 120/72.   PAST MEDICAL HISTORY:  See previous note May 25, 2006 for past  medical history plus above.   ASSESSMENT:  1. Gastroesophageal reflux disease.  Not complicated, but it is      symptomatic, she is using Nexium every other day which is fine.  2. Irritable bowel syndrome plus-minus adhesion pain, controlled by      Levsin and diet changes.   PLAN:  Continue as she is.  See me as needed.  She asked about needing a  repeat endoscopy, and I told her I did not advise that for any type of  screening or surveillance, as long as she had good control of her  symptoms.  She should follow  with Dr. Kriste Basque.  A colonoscopy in 10 years  is appropriate for screening, sooner if signs or symptoms warrant it.  Regarding her hemorrhoids, she should treat those symptomatically with  over the counter agents.  If they worsen, she should let me know.     Iva Boop, MD,FACG  Electronically Signed    CEG/MedQ  DD: 07/31/2006  DT: 07/31/2006  Job #: [id number]

## 2010-10-05 ENCOUNTER — Other Ambulatory Visit: Payer: Self-pay | Admitting: Pulmonary Disease

## 2010-10-12 ENCOUNTER — Other Ambulatory Visit: Payer: Self-pay | Admitting: Obstetrics

## 2010-10-22 ENCOUNTER — Ambulatory Visit: Payer: Self-pay | Admitting: Pulmonary Disease

## 2010-10-28 ENCOUNTER — Ambulatory Visit: Payer: Self-pay | Admitting: Pulmonary Disease

## 2010-11-01 ENCOUNTER — Other Ambulatory Visit: Payer: Self-pay | Admitting: Obstetrics

## 2010-11-30 ENCOUNTER — Ambulatory Visit (INDEPENDENT_AMBULATORY_CARE_PROVIDER_SITE_OTHER): Payer: BC Managed Care – PPO | Admitting: Pulmonary Disease

## 2010-11-30 ENCOUNTER — Encounter: Payer: Self-pay | Admitting: Pulmonary Disease

## 2010-11-30 DIAGNOSIS — K449 Diaphragmatic hernia without obstruction or gangrene: Secondary | ICD-10-CM

## 2010-11-30 DIAGNOSIS — M899 Disorder of bone, unspecified: Secondary | ICD-10-CM

## 2010-11-30 DIAGNOSIS — K219 Gastro-esophageal reflux disease without esophagitis: Secondary | ICD-10-CM

## 2010-11-30 DIAGNOSIS — I872 Venous insufficiency (chronic) (peripheral): Secondary | ICD-10-CM

## 2010-11-30 DIAGNOSIS — K589 Irritable bowel syndrome without diarrhea: Secondary | ICD-10-CM

## 2010-11-30 DIAGNOSIS — I708 Atherosclerosis of other arteries: Secondary | ICD-10-CM

## 2010-11-30 DIAGNOSIS — D649 Anemia, unspecified: Secondary | ICD-10-CM

## 2010-11-30 DIAGNOSIS — I10 Essential (primary) hypertension: Secondary | ICD-10-CM

## 2010-11-30 NOTE — Progress Notes (Signed)
Subjective:    Patient ID: [name], female    DOB: August 08, 1950, 60 y.o.   MRN: [id number]  HPI 60 y/o Asian woman here for a follow up visit... she has mult medical problems as noted below...   ~  July 29, 2009:  she reports a difficult 14mo- saw DrGessner for her chr upper abd discomfort & she reports Nexium w/o benefit, intol Dexilant (made her sleepy)... he did another EGD 3/11- mild gastritis, otherw neg, HPylori neg>> she will try Prilosec OTC vs Prevacid OTC (take Bid before meals)... she would like CAT scan to be sure there is nothing going on in there (CT neg x mild atherosclerosis, DDD). She also c/o mult side effects from her BP meds- intol Avalide w/ photosens reaction even thru her clothes, Micardis affected her vision, and Diovan caused constipation... for now we will try monotherapy w/ Metoprolol 25-50mg  /d (she has tol this xyrs)...  ~  October 26, 2009:  CT Abd 3/11 showed mild atherosclerotic changes in Ao, DDD L5-S1, NAD.Marland Kitchen. subseq CTA by DrGessner 6/11 showed atherosclerotic changes at origin of SMA & Celiac w/o obstruction, & mod dis at origin of IMA which is very diminutive... renal arts OK, Ao is nonaneurysmal, & otherw neg...  her chr abd symptoms sound functional & we discussed trial Align, Activia, Gluten free diet, etc... she indicates that DrGessner may refer her to [facility]...  ~  April 21, 2010:  she tells me that a [place] doctor via web site rec pureed organic cabbage (to strengthen the stomach lining) & she drinks this daily & is feeling better... they also suggested a puppy to help w/ her BP & she has a [place] mix named "Dia Sitter" now & she notes BP doing satis... she is back on Nexium daily & taking a round of Xifaxan per [employer]... we reviewed prev Scans and labs.  ~  November 30, 2010:  25mo ROV & she has stopped the organic cabbage drink "I got sick of it" & back to taking Nexium 40mg  about every other day she says;  BP controlled on meds & feeling well in general;  She  notes some left hip pain & has been trying yoga;  She also had GYN check 6/12 w/ labs done & she brought copies for our records> FLP=good; Chems=wnl, HgA1c=5.7, TSH=0.67, Atypia on PAP & DrTaavon is following;  They recommended Folic Acid daily...   Problem List:  HYPERTENSION (ICD-401.9) - on TOPROL XL 50mg /d; & INTOL to Avalide, Micardis, Diovan she says... BP today on monotherapy = 126/80 & similar at home... denies HA, fatigue, visual changes, CP, palipit, dizziness, syncope, dyspnea, edema, etc...   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 CTAngio 6/11 showed atherosclerotic changes at origin of SMA & Celiac w/o obstruction, mod dis at origin of IMA which is very diminutive, renal arts OK, Ao is nonaneurysmal, & otherw neg exam...  VENOUS INSUFFICIENCY (ICD-459.81) - she follows a low sodium diet, elevates legs, & wears support hose as needed...   HIATAL HERNIA (ICD-553.3) & GERD (ICD-530.81) - on NEXIUM 40mg  Qod or so... she has chr functional GI complaints <see above> she is very concerned about a friend who was diagnosed w/ gastric cancer- he apparently was a smoker but had no greater symptoms than she has had & she wonders about screening for early detection (see below)... She took pureed organic cabbage per [place] medicine web site for many months & felt improved, but she got sick of it &  restarted NEXIUM. 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  ~  10/09 f/u w/ DrGessner- EGD showed 2cmHH, otherw neg- she was reassured. ~  12/10: she remains on Nexium doing well... 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 repeat EGD by DrGessner showed mild gastritis, neg HPylori. ~  She subseq started pureed organic cabbage per [place] medical web site & felt better...  IRRITABLE BOWEL SYNDROME (ICD-564.1) & HEMORRHOIDS (ICD-455.6) - last colon 2/08 w/ small int hems only...  ~  12/10: she had outpt hem surg by DrIngram...  HX, URINARY  INFECTION (ICD-V13.02) - hx urinary symptoms and referred by GYN to Urology w/ cystoscopy 6/09 by DrPeterson= normal... she was tried on [employer] [facility]...   Hx of LOW BACK PAIN - she had prev epid steroid shots in 1996... CT Abd 3/11 showed some DDD in lumbar area. Left Hip Pain> she reports some left hip pain & she is doing Yoga w/ improvement... Hx bilateral CARPAL TUNNEL SYNDROME - she had prev surgery on right- 2/07, and left- 9/09 by DrSypher...  OSTEOPENIA (ICD-733.90) - she takes Calcium, MVI, Vit D... followed by GYN. ~  BMD 2010 at [facility] showed TScores -1.8 in Spine, & -1.3 in right [facility]. ~  She reports f/u BMD 7/12 at GYN office> results pending...  Hx of ANEMIA-NOS (ICD-285.9) ~  labs 5/09 by GYN showed Hg= 13.5 ~  labs here 6/10 showed Hg= 13.5 ~  labs here 3/11 showed Hg= 13.5   Past Surgical History  Procedure Date  . Abdominal hysterectomy   . Bilateral carpal tunnel release 01/2008 and 06/2005    Dr. Teressa Senter  . Left cataract and lens 07/2008    Dr. Charlotte Sanes  . Hemorrhoid sugery 04/2009    Dr. Derrell Lolling    Outpatient Encounter Prescriptions as of 11/30/2010  Medication Sig Dispense Refill  . calcium gluconate 500 MG tablet Take 500 mg by mouth daily.        . Cholecalciferol (VITAMIN D) 1000 UNITS capsule Take 1,000 Units by mouth daily.        Marland Kitchen esomeprazole (NEXIUM) 40 MG capsule Take 40 mg by mouth daily before breakfast.        . folic acid (FOLVITE) 1 MG tablet Take 1 mg by mouth daily.        . metoprolol (TOPROL-XL) 50 MG 24 hr tablet        . DISCONTD: metoprolol (TOPROL-XL) 50 MG 24 hr tablet TAKE 1/2 TO 1 TABLET BY MOUTH EVERY DAY  90 tablet  0    Allergies  Allergen Reactions  . Lansoprazole     REACTION: headaches  . Nitrofurantoin     REACTION: severe sob---causes flu like symptoms  . Penicillins     REACTION: rash  . Sulfamethoxazole W/Trimethoprim     Current Medications, Allergies, Past Medical History, Past Surgical History, Family  History, and Social History were reviewed in [employer] record.    Review of Systems    See HPI - all other systems neg except as noted... The patient denies anorexia, fever, weight loss, weight gain, vision loss, decreased hearing, hoarseness, chest pain, syncope, dyspnea on exertion, peripheral edema, prolonged cough, headaches, hemoptysis, abdominal pain, melena, hematochezia, severe indigestion/heartburn, hematuria, incontinence, muscle weakness, suspicious skin lesions, transient blindness, difficulty walking, depression, unusual weight change, abnormal bleeding, enlarged lymph nodes, and angioedema.    Objective:   Physical Exam     WD, WN, 60 y/o  F in NAD... GENERAL:  Alert & oriented; pleasant & cooperative... HEENT:  [place]/AT, EOM-wnl,  PERRLA, EACs-wax, TMs- not vis, NOSE-clear, THROAT-clear & wnl. NECK:  Supple w/ fairROM; no JVD; normal carotid impulses w/o bruits; no thyromegaly or nodules palpated; no lymphadenopathy. CHEST:  Clear w/o wheezing, rales, rhonchi... HEART:  Regular Rhythm; without murmurs/ rubs/ or gallops... ABDOMEN:  Soft & nontender; normal bowel sounds; no organomegaly or masses detected. EXT: without deformities or arthritic changes; no varicose veins/ +venous insuffic/ no edema. NEURO:  intact w/ no focal deficits noted DERM:  No lesions noted; no rash etc...   Assessment & Plan:   HBP>  Controlled on BBlocker, continue same...  Atherosclerotic changes in visceral arts on CTAngio in 2011>  rec to take ASA 81mg /d...  Venous Insuffic>  Aware- rec no salt, elevation, support hose, etc...  GI> HH, GERD, IBS, Hems>  Back on PPI rx & taking it Qod now; had colon 2008 w/ hems only...  GU> Hx UTIs, Hx voiding problems>  Prev eval by DrPeterson, Urology...  Ortho>  She is doing Yoga & improved...  Osteopenia>  BMDs & rx per GYN.Marland KitchenMarland Kitchen

## 2010-11-30 NOTE — Patient Instructions (Signed)
Today we updated your med list in EPIC...    Remember to take the Aspirin, MVI, & Vit D 1000u daily...  We reviewed the recent labs from your GYN today...  Call for any problems...  Let's plan another routine follow up visit in 6 months.Marland KitchenMarland Kitchen

## 2010-12-12 ENCOUNTER — Encounter: Payer: Self-pay | Admitting: Pulmonary Disease

## 2011-01-09 ENCOUNTER — Other Ambulatory Visit: Payer: Self-pay | Admitting: Pulmonary Disease

## 2011-01-26 LAB — DIFFERENTIAL
Basophils Absolute: 0
Eosinophils Absolute: 0
Lymphocytes Relative: 4 — ABNORMAL LOW
Monocytes Absolute: 0.3
Neutrophils Relative %: 94 — ABNORMAL HIGH

## 2011-01-26 LAB — URINE CULTURE: Colony Count: >=100000

## 2011-01-26 LAB — LIPASE, BLOOD: Lipase: 23

## 2011-01-26 LAB — CBC
HCT: 37.7
Hemoglobin: 12.9
MCV: 90
Platelets: 148 — ABNORMAL LOW
WBC: 14.3 — ABNORMAL HIGH

## 2011-01-26 LAB — URINALYSIS, ROUTINE W REFLEX MICROSCOPIC
Nitrite: NEGATIVE
Specific Gravity, Urine: 1.013
Urobilinogen, UA: 0.2
pH: 7.5

## 2011-01-26 LAB — COMPREHENSIVE METABOLIC PANEL
Albumin: 3.7
Alkaline Phosphatase: 58
BUN: 11
Chloride: 99
Creatinine, Ser: 0.64
GFR calc non Af Amer: >60
Glucose, Bld: 137 — ABNORMAL HIGH
Potassium: 3 — ABNORMAL LOW
Total Bilirubin: 2 — ABNORMAL HIGH

## 2011-01-26 LAB — URINE MICROSCOPIC-ADD ON

## 2011-01-26 LAB — POCT PREGNANCY, URINE: Operator id: 264031

## 2011-01-31 LAB — BASIC METABOLIC PANEL
CO2: 32
Calcium: 9.7
Creatinine, Ser: 0.71
GFR calc Af Amer: >60
GFR calc non Af Amer: >60
Sodium: 140

## 2011-01-31 LAB — POCT HEMOGLOBIN-HEMACUE: Hemoglobin: 15.1 — ABNORMAL HIGH

## 2011-04-07 ENCOUNTER — Other Ambulatory Visit: Payer: Self-pay | Admitting: Pulmonary Disease

## 2011-04-20 ENCOUNTER — Other Ambulatory Visit: Payer: BC Managed Care – PPO

## 2011-04-20 ENCOUNTER — Ambulatory Visit (INDEPENDENT_AMBULATORY_CARE_PROVIDER_SITE_OTHER): Payer: BC Managed Care – PPO | Admitting: Pulmonary Disease

## 2011-04-20 ENCOUNTER — Encounter: Payer: Self-pay | Admitting: Pulmonary Disease

## 2011-04-20 ENCOUNTER — Other Ambulatory Visit: Payer: Self-pay | Admitting: Pulmonary Disease

## 2011-04-20 DIAGNOSIS — K589 Irritable bowel syndrome without diarrhea: Secondary | ICD-10-CM

## 2011-04-20 DIAGNOSIS — M899 Disorder of bone, unspecified: Secondary | ICD-10-CM

## 2011-04-20 DIAGNOSIS — K449 Diaphragmatic hernia without obstruction or gangrene: Secondary | ICD-10-CM

## 2011-04-20 DIAGNOSIS — M545 Low back pain, unspecified: Secondary | ICD-10-CM

## 2011-04-20 DIAGNOSIS — K219 Gastro-esophageal reflux disease without esophagitis: Secondary | ICD-10-CM

## 2011-04-20 DIAGNOSIS — Z87440 Personal history of urinary (tract) infections: Secondary | ICD-10-CM

## 2011-04-20 DIAGNOSIS — M949 Disorder of cartilage, unspecified: Secondary | ICD-10-CM

## 2011-04-20 DIAGNOSIS — I10 Essential (primary) hypertension: Secondary | ICD-10-CM

## 2011-04-20 DIAGNOSIS — I872 Venous insufficiency (chronic) (peripheral): Secondary | ICD-10-CM

## 2011-04-20 DIAGNOSIS — I708 Atherosclerosis of other arteries: Secondary | ICD-10-CM

## 2011-04-20 LAB — URINALYSIS, ROUTINE W REFLEX MICROSCOPIC
Bilirubin Urine: NEGATIVE
Hgb urine dipstick: NEGATIVE
Ketones, ur: NEGATIVE
Nitrite: POSITIVE
Specific Gravity, Urine: 1.01
Total Protein, Urine: NEGATIVE
Urine Glucose: NEGATIVE
Urobilinogen, UA: 0.2
pH: 6 (ref 5.0–8.0)

## 2011-04-20 MED ORDER — ESOMEPRAZOLE MAGNESIUM 40 MG PO CPDR: 40.0000 mg | DELAYED_RELEASE_CAPSULE | Freq: Every day | ORAL | Status: DC

## 2011-04-20 MED ORDER — METOPROLOL SUCCINATE ER 50 MG PO TB24: 50.0000 mg | ORAL_TABLET | Freq: Every day | ORAL | Status: DC

## 2011-04-20 MED ORDER — SERTRALINE HCL 25 MG PO TABS: 25.0000 mg | ORAL_TABLET | Freq: Every day | ORAL | Status: DC

## 2011-04-20 NOTE — Patient Instructions (Signed)
Today we updated your med list in our EPIC system...  We reviewed your recent incredible work-up done in [place]    We are repeating a urinalysis & culture...    We will plan to recheck your thyroid ultrasound in 6 months to follow the tiny nodule they discovered...    We will refer you to [facility]- DrEarly for further Vein eval...  Continue your diet & Yoga work!!!  Call for any questions...  Let's plan a follow up eval in 6 months, sooner if needed for problems.Marland KitchenMarland Kitchen

## 2011-04-20 NOTE — Progress Notes (Signed)
Subjective:    Patient ID: [name], female    DOB: [birth year], 60 y.o.   MRN: [id number]  HPI 60 y/o Asian woman here for a follow up visit... she has mult medical problems as noted below...   ~  July 29, 2009:  she reports a difficult 73mo- saw DrGessner for her chr upper abd discomfort & she reports Nexium w/o benefit, intol Dexilant (made her sleepy)... he did another EGD 3/11- mild gastritis, otherw neg, HPylori neg>> she will try Prilosec OTC vs Prevacid OTC (take Bid before meals)... she would like CAT scan to be sure there is nothing going on in there (CT neg x mild atherosclerosis, DDD).    She also c/o mult side effects from her BP meds- intol Avalide w/ photosens reaction even thru her clothes, Micardis affected her vision, and Diovan caused constipation... for now we will try monotherapy w/ Metoprolol 25-50mg  /d (she has tol this xyrs)...  ~  October 26, 2009:  CT Abd 3/11 showed mild atherosclerotic changes in Ao, DDD L5-S1, NAD.Marland Kitchen. subseq CTA by DrGessner 6/11 showed atherosclerotic changes at origin of SMA & Celiac w/o obstruction, & mod dis at origin of IMA which is very diminutive... renal arts OK, Ao is nonaneurysmal, & otherw neg...  her chr abd symptoms sound functional & we discussed trial Align, Activia, Gluten free diet, etc... she indicates that DrGessner may refer her to [facility]...  ~  April 21, 2010:  she tells me that a [place] doctor via web site rec pureed organic cabbage (to strengthen the stomach lining) & she drinks this daily & is feeling better... they also suggested a puppy to help w/ her BP & she has a [place] mix named "Dia Sitter" now & she notes BP doing satis... she is back on Nexium daily & taking a round of Xifaxan per [employer]... we reviewed prev scans and labs.  ~  November 30, 2010:  4mo ROV & she has stopped the organic cabbage drink "I got sick of it" & back to taking Nexium 40mg  about every other day she says;  BP controlled on meds (w/ [name] help) & feeling well  in general;  She notes some left hip pain & has been trying yoga;  She also had GYN check 6/12 w/ labs done & she brought copies for our records> FLP=good; Chems=wnl, HgA1c=5.7, TSH=0.67, Atypia on PAP & DrTaavon is following;  They recommended Folic Acid daily...  ~  April 20, 2011:  17mo ROV & [name] just returned from a visit to [place] to visit relatives> she had a fabulously thorough medical screening eval at the [employer] by a group called [facility]:  LABS- all wnl x elev CRP & +UTI (not treated- we repeated C&S here- +EColi, pansens & we will treat w/ CIPRO);  +Immunity to HepB;  PAP w/ few atypic cells & she has appt for f/u w/ GYN 1/13;  AbdSonar showed fatty liver & calcif in right lobe, 1.3cm cyst on right kidney, TAH> they rec f/u Sonar ~82mo;  Thyroid Sonar showed 0.9cm nodule & 0.4cm cyst on right side w/ f/u Sonar rec~573mo;  LumbarCT showed diffuse bulging L4-5, degen changes L5-S1 & she feels that Yoga is helping her back;  BrainMRI reported normal...    HBP> on MetoprololER 50mg /d & BP was 115/73 in [place], measures 158/92 here today & we decided to continue same + no salt, etc...    PAD/ ASPVD> she is asked to resume her ASA 81mg /d; prev CTA revealed  atherosclerotic changes at origin of SMA, Celiac art, IMA w/o obstruction; she remains asymptomatic w/o abd pain, N/V, difficulty when eating, etc...    ?Thyroid nodule> 9mm right lobe nodule seen on ultrasound in [place] 11/12 & we will repeat in 6 months; not palp on exam; clinically 7 biochem euthyroid...    GI> HH/ GERD/ IBS> on Nexium40; she has had extensive work up by [employer] (see below) felt to have functional complaints & felt improved on pureed organic cabbage- now back on the Nexium...    UTI> she was said to have a lower urinary infection on her work up in [place] but they didn't treat it; repeat UA & C/S here is pos for [employer] & we will treat w/ Cipro250Bid...    Ortho> LBP, Hx left hip pain, bilat CTS> [place]  eval showed diffuse bulging disc at L4-5 & she states that she is much improved w/ Yoga exercises...    ?Depression/ Nerves> she was started on some medication in [place] (Stablon 12.5mg Bid & Enafon 10mg Qhs) & we decided to try SERTALINE 25mg /d...          Problem List:  HYPERTENSION (ICD-401.9) - on TOPROL XL 50mg /d; & INTOL to Avalide, Micardis, Diovan she says...  ~  7/12: BP on monotherapy = 126/80 & similar at home... denies HA, fatigue, visual changes, CP, palipit, dizziness, syncope, dyspnea, edema, etc... ~  12/12: BP= 158/92 but was recently 115/73 in [place] & we will continue same med & monitor BP at home...  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 CTAngio 6/11 showed atherosclerotic changes at origin of SMA & Celiac w/o obstruction, mod dis at origin of IMA which is very diminutive, renal arts OK, Ao is nonaneurysmal, & otherw neg exam... ~  She remains asymptomatic w/o abd symptoms or mealtime symptoms related to this atherosclerosis...  VENOUS INSUFFICIENCY (ICD-459.81) - she follows a low sodium diet, elevates legs, & wears support hose as needed...  ~  12/12: she worries about her veins and would like to see a vein specialist...  HIATAL HERNIA (ICD-553.3) & GERD (ICD-530.81) - on NEXIUM 40mg  Qod or so... she has chr functional GI complaints <see above> she was very concerned about a friend who was diagnosed w/ gastric cancer- he apparently was a smoker but had no greater symptoms than she has had & she wonders about screening for early detection (see below)... She took pureed organic cabbage per [place] medicine web site for many months & felt improved, but she got sick of it & restarted NEXIUM. 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  ~  10/09 f/u w/ DrGessner- EGD showed 2cmHH, otherw neg- she was reassured. ~  12/10: she remains on Nexium doing well... 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 repeat EGD by DrGessner  showed mild gastritis, neg HPylori. ~  She subseq started pureed organic cabbage per [place] medical web site & felt better; later switched back to Nexium when she got sick of the cabbage.  IRRITABLE BOWEL SYNDROME (ICD-564.1) & HEMORRHOIDS (ICD-455.6) - last colon 2/08 w/ small int hems only...  ~  12/10: she had outpt hem surg by DrIngram...  HX, URINARY INFECTION (ICD-V13.02) - hx urinary symptoms and referred by GYN to Urology w/ cystoscopy 6/09 by DrPeterson= normal... she was tried on [employer] [facility]...  ~  Otilio Carpen lower urinary tract infections> had abn UA during work up in [place], [place] & C/S repeated here & pos for  EColi UTI (pansens) treated w/ Cipro...  Hx of LOW BACK PAIN - she had prev epid steroid shots in 1996... CT Abd 3/11 showed some DDD in lumbar area. ~  Lumbar CT in [place] 11/12 reported diffuse bulging disc at L4-5 & pt states pain is diminished w/ Yoga exercises... Left Hip Pain> she reports some left hip pain & she is doing Yoga w/ improvement... Hx bilateral CARPAL TUNNEL SYNDROME - she had prev surgery on right- 2/07, and left- 9/09 by DrSypher...  OSTEOPENIA (ICD-733.90) - she takes Calcium, MVI, Vit D... followed by GYN. ~  BMD 2010 at [facility] showed TScores -1.8 in Spine, & -1.3 in right [facility]. ~  She reports f/u BMD 7/12 at GYN office> results pending...  ?Depression/ Nerves> she was started on some medication in [place] (Stablon 12.5mg Bid & Enafon 10mg Qhs) & we decided to try SERTALINE 25mg /d...  Hx of ANEMIA-NOS (ICD-285.9) ~  labs 5/09 by GYN showed Hg= 13.5 ~  labs here 6/10 showed Hg= 13.5 ~  labs here 3/11 showed Hg= 13.5   Past Surgical History  Procedure Date  . Abdominal hysterectomy   . Bilateral carpal tunnel release 01/2008 and 06/2005    Dr. Teressa Senter  . Left cataract and lens 07/2008    Dr. Charlotte Sanes  . Hemorrhoid sugery 04/2009    Dr. Derrell Lolling    Outpatient Encounter Prescriptions as of 04/20/2011  Medication Sig Dispense Refill  . aspirin 81 MG  tablet Take 81 mg by mouth daily.        . calcium gluconate 500 MG tablet Take 500 mg by mouth daily.        . Cholecalciferol (VITAMIN D) 1000 UNITS capsule Take 1,000 Units by mouth daily.        Marland Kitchen esomeprazole (NEXIUM) 40 MG capsule Take 40 mg by mouth daily before breakfast.        . folic acid (FOLVITE) 1 MG tablet Take 1 mg by mouth daily.        . metoprolol (TOPROL-XL) 50 MG 24 hr tablet        . metoprolol (TOPROL-XL) 50 MG 24 hr tablet TAKE 1/2 TO 1 TABLET BY MOUTH EVERY DAY  90 tablet  3  . Multiple Vitamins-Minerals (MULTIVITAMIN & MINERAL PO) Take 1 tablet by mouth daily.          Allergies  Allergen Reactions  . Lansoprazole     REACTION: headaches  . Nitrofurantoin     REACTION: severe sob---causes flu like symptoms  . Penicillins     REACTION: rash  . Sulfamethoxazole W/Trimethoprim     Current Medications, Allergies, Past Medical History, Past Surgical History, Family History, and Social History were reviewed in [employer] record.    Review of Systems    See HPI - all other systems neg except as noted... The patient denies anorexia, fever, weight loss, weight gain, vision loss, decreased hearing, hoarseness, chest pain, syncope, dyspnea on exertion, peripheral edema, prolonged cough, headaches, hemoptysis, abdominal pain, melena, hematochezia, severe indigestion/heartburn, hematuria, incontinence, muscle weakness, suspicious skin lesions, transient blindness, difficulty walking, depression, unusual weight change, abnormal bleeding, enlarged lymph nodes, and angioedema.    Objective:   Physical Exam     WD, WN, 60 y/o F in NAD... GENERAL:  Alert & oriented; pleasant & cooperative... HEENT:  [place]/AT, EOM-wnl,  PERRLA, EACs-wax, TMs- not vis, NOSE-clear, THROAT-clear & wnl. NECK:  Supple w/ fairROM; no JVD; normal carotid impulses w/o bruits; no thyromegaly or nodules palpated;  no lymphadenopathy. CHEST:  Clear w/o wheezing, rales,  rhonchi... HEART:  Regular Rhythm; without murmurs/ rubs/ or gallops... ABDOMEN:  Soft & nontender; normal bowel sounds; no organomegaly or masses detected. EXT: without deformities or arthritic changes; no varicose veins/ +venous insuffic/ no edema. NEURO:  intact w/ no focal deficits noted DERM:  No lesions noted; no rash etc...   Assessment & Plan:   HBP>  Controlled on BBlocker, continue same...  Atherosclerotic changes in visceral arts on CTAngio in 2011>  rec to take ASA 81mg /d...  Venous Insuffic>  Aware- rec no salt, elevation, support hose, etc...  GI> HH, GERD, IBS, Hems>  Back on PPI rx & taking it Qod now; had colon 2008 w/ hems only...  GU> Hx UTIs, Hx voiding problems>  Prev eval by DrPeterson, Urology...  Ortho>  She is doing Yoga & improved...  Osteopenia>  BMDs & rx per GYN...  ?Depression/ Nerves> she was started on some medication in [place] (Stablon 12.5mg Bid & Enafon 10mg Qhs) & we decided to try SERTALINE 25mg /d...   Patient's Medications  New Prescriptions     CIPRO 250mg  >> take 1 tab po Bid x7d... ==> called into [facility] on [address]...   SERTRALINE (ZOLOFT) 25 MG TABLET    Take 1 tablet (25 mg total) by mouth daily.  Previous Medications   CALCIUM GLUCONATE 500 MG TABLET    Take 500 mg by mouth daily.     CHOLECALCIFEROL (VITAMIN D) 1000 UNITS CAPSULE    Take 1,000 Units by mouth daily.     FOLIC ACID (FOLVITE) 1 MG TABLET    Take 1 mg by mouth daily.     MULTIPLE VITAMINS-MINERALS (MULTIVITAMIN & MINERAL PO)    Take 1 tablet by mouth daily.    Modified Medications   Modified Medication Previous Medication   ESOMEPRAZOLE (NEXIUM) 40 MG CAPSULE esomeprazole (NEXIUM) 40 MG capsule      Take 1 capsule (40 mg total) by mouth daily before breakfast.    Take 40 mg by mouth daily before breakfast.     METOPROLOL (TOPROL-XL) 50 MG 24 HR TABLET metoprolol (TOPROL-XL) 50 MG 24 hr tablet      Take 1 tablet (50 mg total) by mouth daily.    Take 50 mg by mouth  daily.   Discontinued Medications   ASPIRIN 81 MG TABLET    Take 81 mg by mouth daily.     METOPROLOL (TOPROL-XL) 50 MG 24 HR TABLET    TAKE 1/2 TO 1 TABLET BY MOUTH EVERY DAY

## 2011-04-22 ENCOUNTER — Telehealth: Payer: Self-pay | Admitting: Pulmonary Disease

## 2011-04-22 LAB — URINE CULTURE: Colony Count: >=100000

## 2011-04-22 NOTE — Telephone Encounter (Signed)
Noted  

## 2011-04-24 ENCOUNTER — Encounter: Payer: Self-pay | Admitting: Pulmonary Disease

## 2011-04-27 ENCOUNTER — Other Ambulatory Visit: Payer: Self-pay

## 2011-04-27 DIAGNOSIS — I83893 Varicose veins of bilateral lower extremities with other complications: Secondary | ICD-10-CM

## 2011-05-15 IMAGING — CT CT ANGIO ABDOMEN
2 of 7 series · 17 of 46 positions shown, 19 images · IV contrast (omnipaque)
Comparison: 07/30/2009

CTA ABDOMEN

CLINICAL DATA: Abdominal pain and weight loss

CT ANGIOGRAPHY  ABDOMEN AND PELVIS
TECHNIQUE: Multidetector CT imaging through the abdomen and pelvis
using the standard protocol during bolus administration of
intravenous contrast. Multiplanar reconstructed images obtained and
reviewed to evaluate the vascular anatomy. 100 ml Omnipaque 350

[Series 4: cta_abd 3.0 · axial · 0.69mm/px · z∈[-467,-77]mm · 14 of 146 slices shown, 16 images]
[im 8/146  soft-tissue]
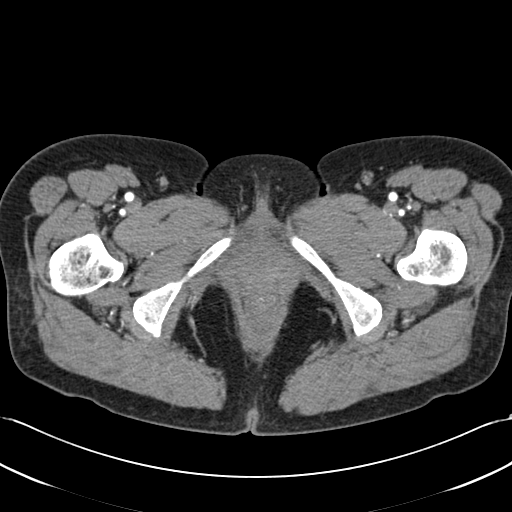
[im 8/146  bone]
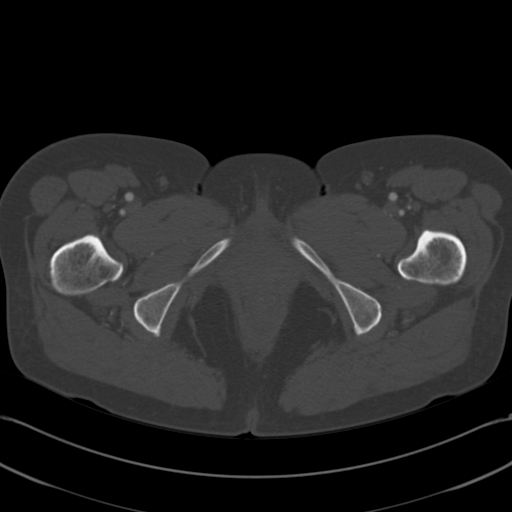
[im 22/146  soft-tissue]
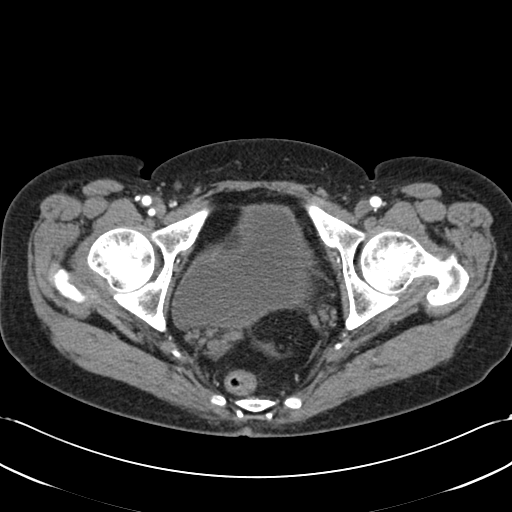
[im 30/146  soft-tissue]
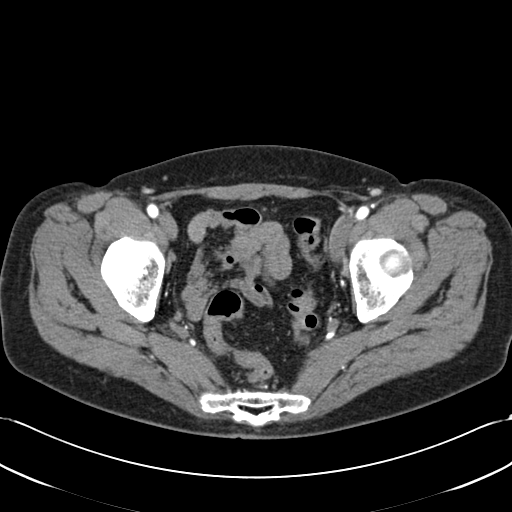
[im 37/146  soft-tissue]
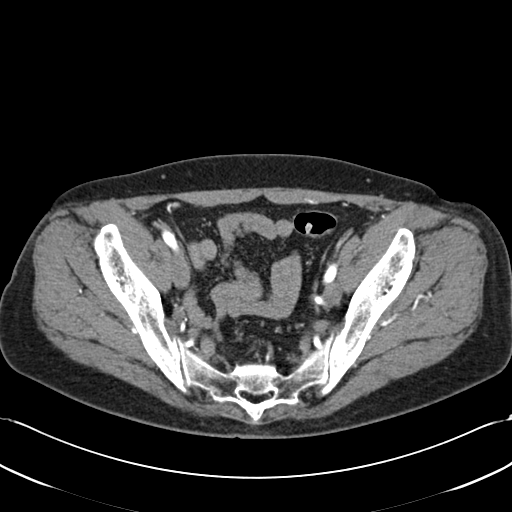
[im 51/146  soft-tissue]
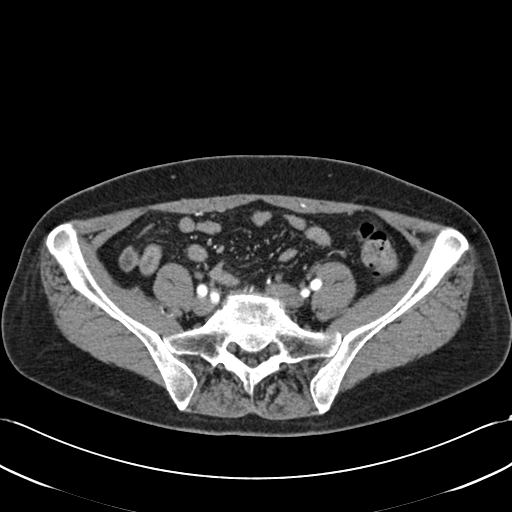
[im 59/146  soft-tissue]
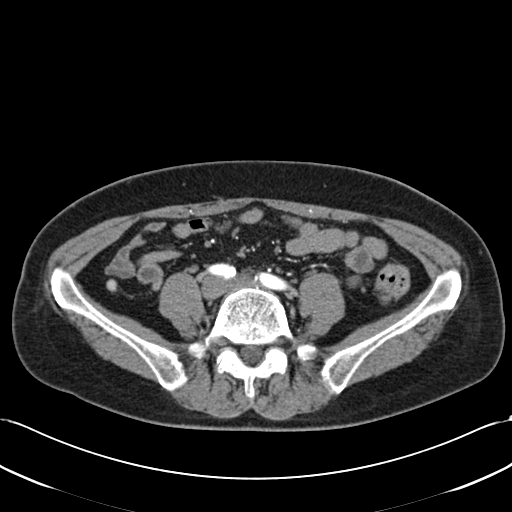
[im 66/146  soft-tissue]
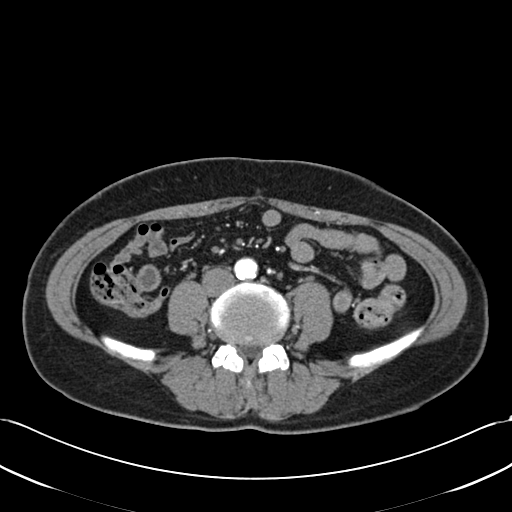
[im 80/146  soft-tissue]
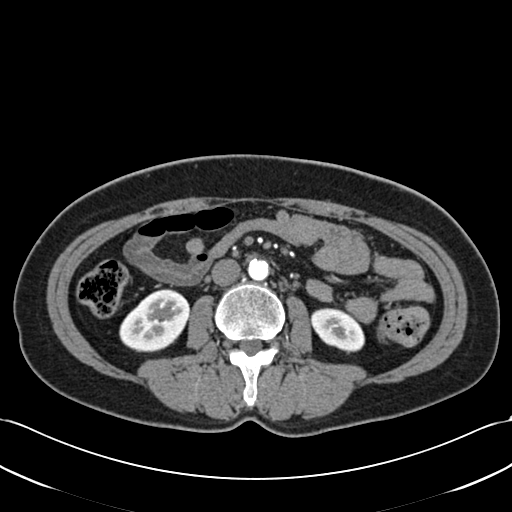
[im 88/146  soft-tissue]
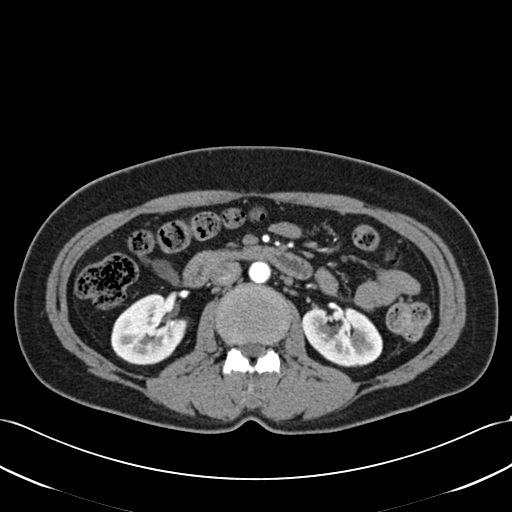
[im 88/146  bone]
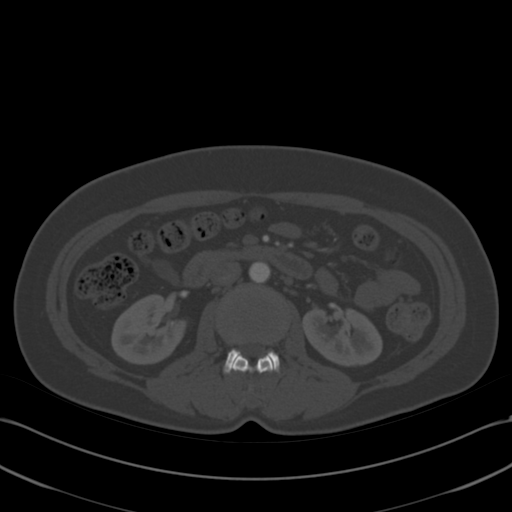
[im 95/146  soft-tissue]
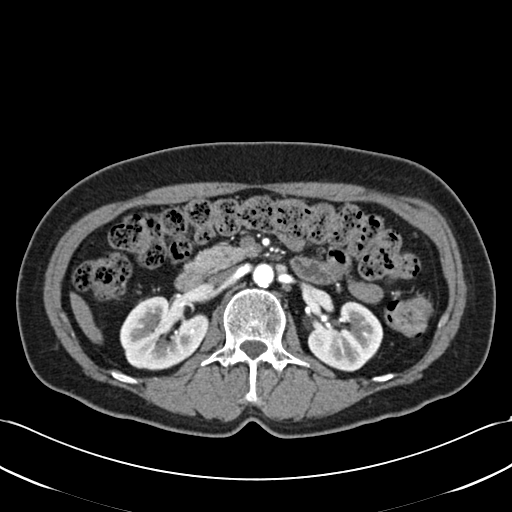
[im 109/146  soft-tissue]
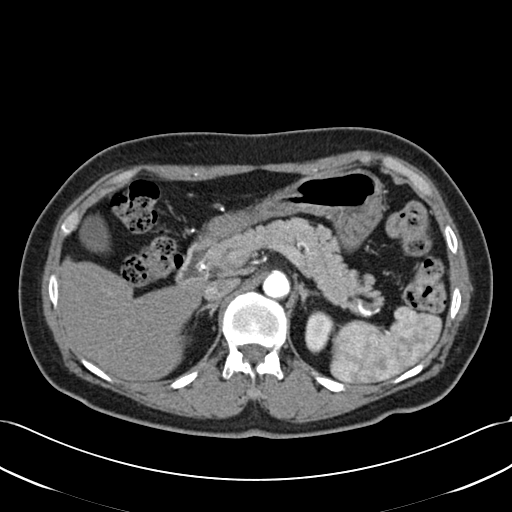
[im 117/146  soft-tissue]
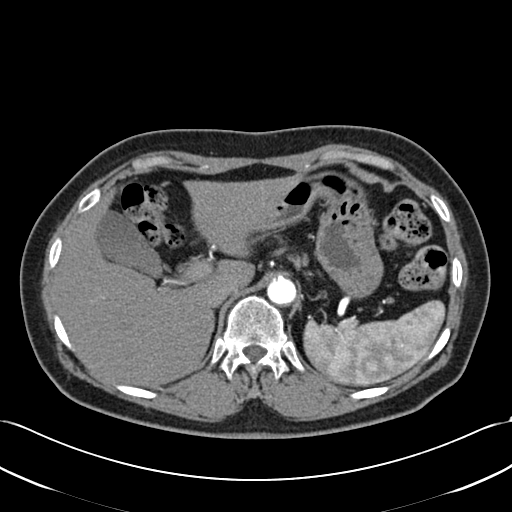
[im 124/146  soft-tissue]
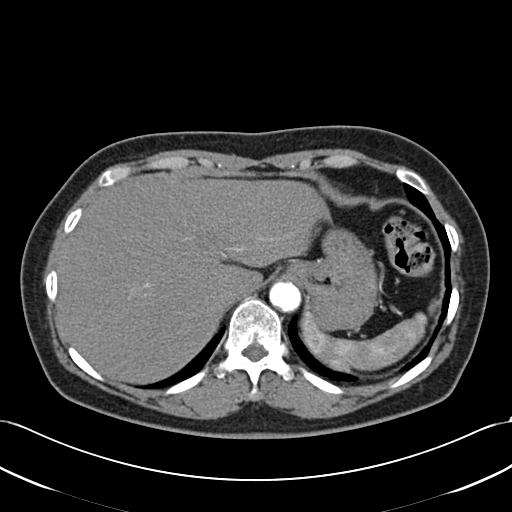
[im 138/146  soft-tissue]
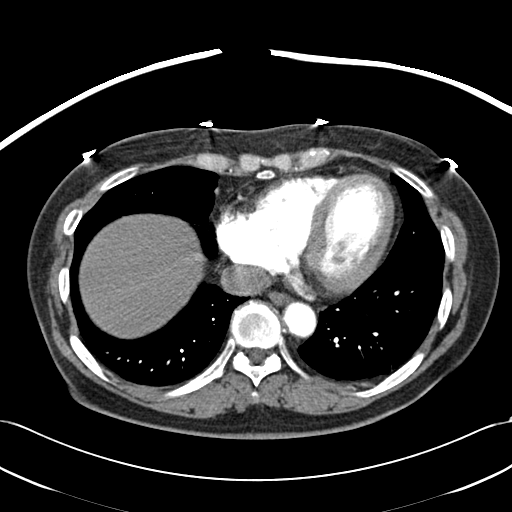

[Series 602: <mpr thick range> · coronal · 0.87mm/px · 3 of 101 slices shown]
[im 34/101  soft-tissue]
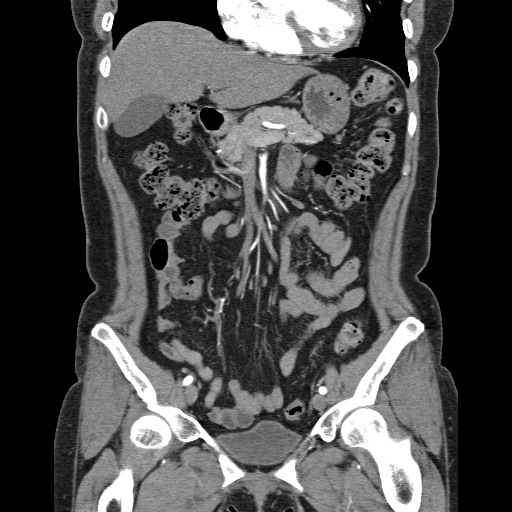
[im 51/101  soft-tissue]
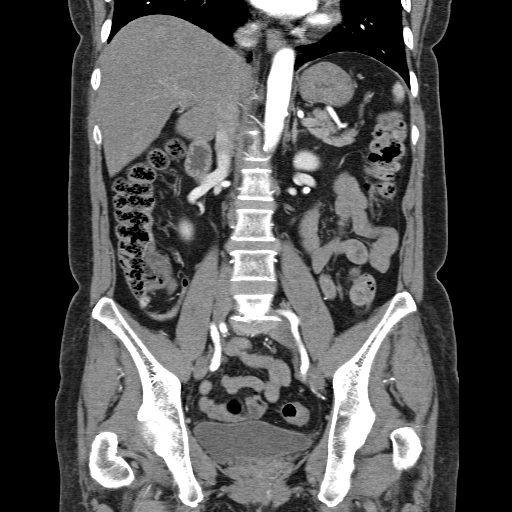
[im 67/101  soft-tissue]
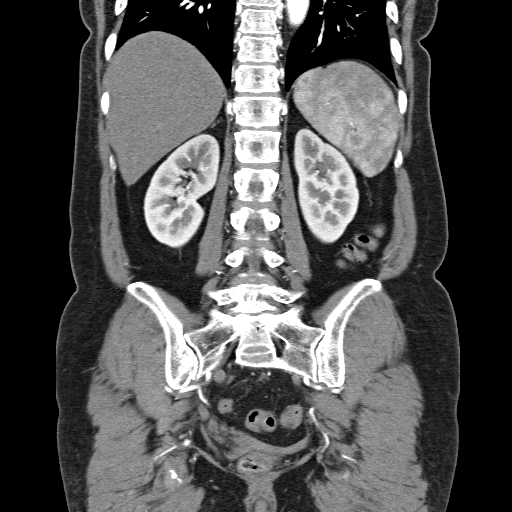

[17 of 46 positions shown; findings below may reference images not displayed]

FINDINGS: Atherosclerotic changes at the origin of the SMA and
celiac are present without significant narrowing.  Moderate disease
at the origin of the IMA which is very diminutive.

Single renal arteries are patent bilaterally.  Aorta is
nonaneurysmal and patent.

The liver, spleen, pancreas, and adrenal glands are within normal
limits.  Tiny hypodensities in the kidneys are not significantly
changed.  Negative free fluid.
IMPRESSION: No significant visceral arterial obstruction to result in
mesenteric ischemia.

CTA PELVIS
FINDINGS: Bilateral common, internal, and external iliac arteries
are patent with mild atherosclerotic changes.

Normal appendix.  Bladder is unremarkable.  Uterus is absent.
Prominent degenerative disc disease at L5-S1.  No vertebral body
height loss.
IMPRESSION: No significant arterial obstruction.  No acute intrapelvic
pathology.

## 2011-05-30 ENCOUNTER — Encounter: Payer: Self-pay | Admitting: Vascular Surgery

## 2011-05-31 ENCOUNTER — Ambulatory Visit (INDEPENDENT_AMBULATORY_CARE_PROVIDER_SITE_OTHER): Payer: BC Managed Care – PPO | Admitting: Vascular Surgery

## 2011-05-31 ENCOUNTER — Encounter (INDEPENDENT_AMBULATORY_CARE_PROVIDER_SITE_OTHER): Payer: BC Managed Care – PPO | Admitting: *Deleted

## 2011-05-31 ENCOUNTER — Encounter: Payer: Self-pay | Admitting: Vascular Surgery

## 2011-05-31 DIAGNOSIS — I83893 Varicose veins of bilateral lower extremities with other complications: Secondary | ICD-10-CM

## 2011-05-31 DIAGNOSIS — M79609 Pain in unspecified limb: Secondary | ICD-10-CM

## 2011-05-31 NOTE — Progress Notes (Signed)
The patient presents today for evaluation of bilateral lower extremity venous hypertension. She has a long history of bilateral lower extremity venous varicosities and pain and swelling with prolonged standing. 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 She has been compliant with knee-high compression with minimal improvement. She does not have any history of DVT. The course of the discomfort is mainly in the calves aching sensation and progressive throughout the day. Past Medical History  Diagnosis Date  . Hypertension   . Atherosclerosis of other specified arteries   . Venous insufficiency   . Hiatal hernia   . GERD (gastroesophageal reflux disease)   . IBS (irritable bowel syndrome)   . Hemorrhoids   . UTI (lower urinary tract infection)   . Osteopenia   . Low back pain   . Anemia   . Bilateral carpal tunnel syndrome     History  Substance Use Topics  . Smoking status: Never Smoker   . Smokeless tobacco: Not on file  . Alcohol Use: No    History reviewed. No pertinent family history.  Allergies  Allergen Reactions  . Lansoprazole     REACTION: headaches  . Nitrofurantoin     REACTION: severe sob---causes flu like symptoms  . Penicillins     REACTION: rash  . Sulfamethoxazole W/Trimethoprim     Current outpatient prescriptions:calcium gluconate 500 MG tablet, Take 500 mg by mouth daily.  , Disp: , Rfl: ;  Cholecalciferol (VITAMIN D) 1000 UNITS capsule, Take 1,000 Units by mouth daily.  , Disp: , Rfl: ;  esomeprazole (NEXIUM) 40 MG capsule, Take 1 capsule (40 mg total) by mouth daily before breakfast., Disp: 90 capsule, Rfl: 3;  folic acid (FOLVITE) 1 MG tablet, Take 1 mg by mouth daily.  , Disp: , Rfl:  metoprolol (TOPROL-XL) 50 MG 24 hr tablet, Take 1 tablet (50 mg total) by mouth daily., Disp: 90 tablet, Rfl: 3;  Multiple Vitamins-Minerals (MULTIVITAMIN & MINERAL PO), Take 1 tablet by mouth daily.  , Disp: , Rfl:  ;  sertraline (ZOLOFT) 25 MG tablet, Take 1 tablet (25 mg total) by mouth daily., Disp: 90 tablet, Rfl: 3  There were no vitals taken for this visit.  There is no height or weight on file to calculate BMI.       Review of systems: Positive for varicosities in leg pain with walking otherwise negative Physical exam: Well-developed well-nourished female in no acute distress.  HEENT normal. Pulses radial and dorsalis pedis pulses 2+ bilaterally. Extremities: She does have varicosities in the calves of both legs  Vascular lab: Venous duplex reveals incompetence in her great saphenous vein bilaterally and slight incompetence in her proximal small saphenous veins bilaterally  Impression and plan bilateral venous hypertension, symptomatic. She has been compliant with knee-high compression. We fitted her today with thigh high 20-30 mm mercury compression structure on the use of these. We will see her again in 3 months for further discussion. I did explain she would be a candidate for laser ablation of her symptom relief should she fail conservative treatment

## 2011-06-02 NOTE — Procedures (Unsigned)
LOWER EXTREMITY VENOUS REFLUX EXAM  INDICATION:  Lower extremity edema.  EXAM:  Using color-flow imaging and pulse Doppler spectral analysis, the bilateral common femoral, femoral, popliteal, posterior tibial, great and small saphenous veins were evaluated.  There is evidence suggesting mild to moderate deep venous insufficiency in the bilateral lower extremities.  The bilateral saphenofemoral junction is not competent with reflux of > 500 milliseconds.  The bilateral great saphenous vein is not competent with the right being greater than the left.  Reflux of > 500 milliseconds with the caliber as described below.   The bilateral proximal small saphenous vein demonstrates incompetency.  GSV Diameter (used if found to be incompetent only)                                           Right    Left Proximal Greater Saphenous Vein           0.55 cm  0.39 cm Proximal-to-mid-thigh                     0.43 cm  0.27 cm Mid thigh                                 0.47 cm  0.31 cm Mid-distal thigh                          0.43 cm  cm Distal thigh                              0.58 cm  0.25 cm Knee                                      0.43 cm  0.27 cm   IMPRESSION: 1. The bilateral greater saphenous vein is not competent with reflux     of > 500 milliseconds. 2. The bilateral greater saphenous vein is not tortuous. 3. The deep venous system bilaterally is not competent with reflux of     > 500 milliseconds. 4. The bilateral small saphenous vein is not competent with reflux of     > 500 milliseconds. 5. No evidence of deep vein thrombosis bilaterally.        ___________________________________________ Larina Earthly, M.D.  SS/MEDQ  D:  05/31/2011  T:  05/31/2011  Job:  [id number]

## 2011-06-07 ENCOUNTER — Ambulatory Visit: Payer: BC Managed Care – PPO | Admitting: Pulmonary Disease

## 2011-06-16 ENCOUNTER — Ambulatory Visit (INDEPENDENT_AMBULATORY_CARE_PROVIDER_SITE_OTHER): Payer: BC Managed Care – PPO | Admitting: Adult Health

## 2011-06-16 ENCOUNTER — Other Ambulatory Visit (INDEPENDENT_AMBULATORY_CARE_PROVIDER_SITE_OTHER): Payer: BC Managed Care – PPO

## 2011-06-16 ENCOUNTER — Encounter: Payer: Self-pay | Admitting: Adult Health

## 2011-06-16 DIAGNOSIS — R634 Abnormal weight loss: Secondary | ICD-10-CM

## 2011-06-16 DIAGNOSIS — R5381 Other malaise: Secondary | ICD-10-CM

## 2011-06-16 DIAGNOSIS — D649 Anemia, unspecified: Secondary | ICD-10-CM

## 2011-06-16 DIAGNOSIS — R5383 Other fatigue: Secondary | ICD-10-CM

## 2011-06-16 LAB — CBC WITH DIFFERENTIAL/PLATELET
Basophils Relative: 0.5 % (ref 0.0–3.0)
Eosinophils Relative: 1.7 % (ref 0.0–5.0)
Hemoglobin: 14.1 g/dL (ref 12.0–15.0)
Lymphocytes Relative: 41.3 % (ref 12.0–46.0)
Neutrophils Relative %: 49.8 % (ref 43.0–77.0)
RBC: 4.53 Mil/uL (ref 3.87–5.11)
WBC: 4.1 10*3/uL — ABNORMAL LOW (ref 4.5–10.5)

## 2011-06-16 LAB — BASIC METABOLIC PANEL
Calcium: 9.4 mg/dL (ref 8.4–10.5)
Chloride: 104 mEq/L (ref 96–112)
Creatinine, Ser: 0.7 mg/dL (ref 0.4–1.2)
Sodium: 140 mEq/L (ref 135–145)

## 2011-06-16 LAB — VITAMIN B12: Vitamin B-12: 745 pg/mL (ref 211–911)

## 2011-06-16 NOTE — Patient Instructions (Signed)
We will check labs today , I will call with results.  Follow up Dr. Kriste Basque  In 1 month and As needed

## 2011-06-16 NOTE — Assessment & Plan Note (Addendum)
Fatigue with unclear etiology.  Thinning hair ? heridatory   Possibly r/t ?depression  Plan:  1. Will check labs today, CBC, BMET, TSH,vitamin d and b12  2. Follow up Dr. Kriste Basque  In 1 month as scheduled and as needed

## 2011-06-16 NOTE — Progress Notes (Signed)
Subjective:    Patient ID: [name], female    DOB: 1951/01/02, 61 y.o.   MRN: [id number]  HPI 61 y/o Asian woman     ~  July 29, 2009:  she reports a difficult 831mo- saw DrGessner for her chr upper abd discomfort & she reports Nexium w/o benefit, intol Dexilant (made her sleepy)... he did another EGD 3/11- mild gastritis, otherw neg, HPylori neg>> she will try Prilosec OTC vs Prevacid OTC (take Bid before meals)... she would like CAT scan to be sure there is nothing going on in there (CT neg x mild atherosclerosis, DDD).    She also c/o mult side effects from her BP meds- intol Avalide w/ photosens reaction even thru her clothes, Micardis affected her vision, and Diovan caused constipation... for now we will try monotherapy w/ Metoprolol 25-50mg  /d (she has tol this xyrs)...  ~  October 26, 2009:  CT Abd 3/11 showed mild atherosclerotic changes in Ao, DDD L5-S1, NAD.Marland Kitchen. subseq CTA by DrGessner 6/11 showed atherosclerotic changes at origin of SMA & Celiac w/o obstruction, & mod dis at origin of IMA which is very diminutive... renal arts OK, Ao is nonaneurysmal, & otherw neg...  her chr abd symptoms sound functional & we discussed trial Align, Activia, Gluten free diet, etc... she indicates that DrGessner may refer her to [facility]...  ~  April 21, 2010:  she tells me that a [place] doctor via web site rec pureed organic cabbage (to strengthen the stomach lining) & she drinks this daily & is feeling better... they also suggested a puppy to help w/ her BP & she has a [place] mix named "Dia Sitter" now & she notes BP doing satis... she is back on Nexium daily & taking a round of Xifaxan per [employer]... we reviewed prev scans and labs.  ~  November 30, 2010:  60mo ROV & she has stopped the organic cabbage drink "I got sick of it" & back to taking Nexium 40mg  about every other day she says;  BP controlled on meds (w/ [name] help) & feeling well in general;  She notes some left hip pain & has been trying yoga;  She also  had GYN check 6/12 w/ labs done & she brought copies for our records> FLP=good; Chems=wnl, HgA1c=5.7, TSH=0.67, Atypia on PAP & DrTaavon is following;  They recommended Folic Acid daily...  ~  April 20, 2011:  60mo ROV & [name] just returned from a visit to [place] to visit relatives> she had a fabulously thorough medical screening eval at the [facility] by a group called [facility]:  LABS- all wnl x elev CRP & +UTI (not treated- we repeated C&S here- +EColi, pansens & we will treat w/ CIPRO);  +Immunity to HepB;  PAP w/ few atypic cells & she has appt for f/u w/ GYN 1/13;  AbdSonar showed fatty liver & calcif in right lobe, 1.3cm cyst on right kidney, TAH> they rec f/u Sonar ~431mo;  Thyroid Sonar showed 0.9cm nodule & 0.4cm cyst on right side w/ f/u Sonar rec~90mo;  LumbarCT showed diffuse bulging L4-5, degen changes L5-S1 & she feels that Yoga is helping her back;  BrainMRI reported normal...    HBP> on MetoprololER 50mg /d & BP was 115/73 in [place], measures 158/92 here today & we decided to continue same + no salt, etc...    PAD/ ASPVD> she is asked to resume her ASA 81mg /d; prev CTA revealed atherosclerotic changes at origin of SMA, Celiac art, IMA w/o obstruction; she  remains asymptomatic w/o abd pain, N/V, difficulty when eating, etc...    ?Thyroid nodule> 60mm right lobe nodule seen on ultrasound in [place] 11/12 & we will repeat in 6 months; not palp on exam; clinically 7 biochem euthyroid...    GI> HH/ GERD/ IBS> on Nexium40; she has had extensive work up by [employer] (see below) felt to have functional complaints & felt improved on pureed organic cabbage- now back on the Nexium...    UTI> she was said to have a lower urinary infection on her work up in [place] but they didn't treat it; repeat UA & C/S here is pos for [employer] & we will treat w/ Cipro250Bid...    Ortho> LBP, Hx left hip pain, bilat CTS> [place] eval showed diffuse bulging disc at L4-5 & she states that she is much  improved w/ Yoga exercises...    ?Depression/ Nerves> she was started on some medication in [place] (Stablon 12.5mg Bid & Enafon 10mg Qhs) & we decided to try SERTALINE 25mg /d...  06/16/2011 Acute OV  61 yo Asian female with c/o fatigue and hair loss x 2 weeks. No new meds. Was started on Zoloft last OV for ?depression but has not taken.  Usually very active with exercise and golf on most days. Still enjoys these activities but now "just doesn't feel like it."  Complains of no energy. Sleeps ok but wakes up during night at times. No sleep aides used.  Hair is thinning on top. Sister has similar problem.  No weight loss or gain. No dyspnea, palpitations or CP. No fevers, sweats, chills or recent illness.          Problem List:  HYPERTENSION (ICD-401.9) - on TOPROL XL 50mg /d; & INTOL to Avalide, Micardis, Diovan she says...  ~  7/12: BP on monotherapy = 126/80 & similar at home... denies HA, fatigue, visual changes, CP, palipit, dizziness, syncope, dyspnea, edema, etc... ~  12/12: BP= 158/92 but was recently 115/73 in [place] & we will continue same med & monitor BP at home...  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 CTAngio 6/11 showed atherosclerotic changes at origin of SMA & Celiac w/o obstruction, mod dis at origin of IMA which is very diminutive, renal arts OK, Ao is nonaneurysmal, & otherw neg exam... ~  She remains asymptomatic w/o abd symptoms or mealtime symptoms related to this atherosclerosis...  VENOUS INSUFFICIENCY (ICD-459.81) - she follows a low sodium diet, elevates legs, & wears support hose as needed...  ~  12/12: she worries about her veins and would like to see a vein specialist...  HIATAL HERNIA (ICD-553.3) & GERD (ICD-530.81) - on NEXIUM 40mg  Qod or so... she has chr functional GI complaints <see above> she was very concerned about a friend who was diagnosed w/ gastric cancer- he apparently was a smoker but had no greater symptoms than she has had  & she wonders about screening for early detection (see below)... She took pureed organic cabbage per [place] medicine web site for many months & felt improved, but she got sick of it & restarted NEXIUM. 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  ~  10/09 f/u w/ DrGessner- EGD showed 2cmHH, otherw neg- she was reassured. ~  12/10: she remains on Nexium doing well... 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 repeat EGD by DrGessner showed mild gastritis, neg HPylori. ~  She subseq started pureed organic cabbage per [place] medical web site & felt better; later switched  back to Nexium when she got sick of the cabbage.  IRRITABLE BOWEL SYNDROME (ICD-564.1) & HEMORRHOIDS (ICD-455.6) - last colon 2/08 w/ small int hems only...  ~  12/10: she had outpt hem surg by DrIngram...  HX, URINARY INFECTION (ICD-V13.02) - hx urinary symptoms and referred by GYN to Urology w/ cystoscopy 6/09 by DrPeterson= normal... she was tried on [employer] [facility]...  ~  Otilio Carpen lower urinary tract infections> had abn UA during work up in [place], [place] & C/S repeated here & pos for [facility] UTI (pansens) treated w/ Cipro...  Hx of LOW BACK PAIN - she had prev epid steroid shots in 1996... CT Abd 3/11 showed some DDD in lumbar area. ~  Lumbar CT in [place] 11/12 reported diffuse bulging disc at L4-5 & pt states pain is diminished w/ Yoga exercises... Left Hip Pain> she reports some left hip pain & she is doing Yoga w/ improvement... Hx bilateral CARPAL TUNNEL SYNDROME - she had prev surgery on right- 2/07, and left- 9/09 by DrSypher...  OSTEOPENIA (ICD-733.90) - she takes Calcium, MVI, Vit D... followed by GYN. ~  BMD 2010 at [facility] showed TScores -1.8 in Spine, & -1.3 in right [facility]. ~  She reports f/u BMD 7/12 at GYN office> results pending...  ?Depression/ Nerves> she was started on some medication in [place] (Stablon 12.5mg Bid & Enafon 10mg Qhs) & we decided to try SERTALINE 25mg /d...  Hx of ANEMIA-NOS  (ICD-285.9) ~  labs 5/09 by GYN showed Hg= 13.5 ~  labs here 6/10 showed Hg= 13.5 ~  labs here 3/11 showed Hg= 13.5   Past Surgical History  Procedure Date  . Abdominal hysterectomy   . Bilateral carpal tunnel release 01/2008 and 06/2005    Dr. Teressa Senter  . Left cataract and lens 07/2008    Dr. Charlotte Sanes  . Hemorrhoid sugery 04/2009    Dr. Derrell Lolling  . Breast reduction surgery 1999   Outpatient Encounter Prescriptions as of 06/16/2011  Medication Sig Dispense Refill  . calcium gluconate 500 MG tablet Take 500 mg by mouth daily.        Marland Kitchen esomeprazole (NEXIUM) 40 MG capsule Take 1 capsule (40 mg total) by mouth daily before breakfast.  90 capsule  3  . folic acid (FOLVITE) 1 MG tablet Take 1 mg by mouth daily.        . metoprolol (TOPROL-XL) 50 MG 24 hr tablet Take 1 tablet (50 mg total) by mouth daily.  90 tablet  3  . Multiple Vitamins-Minerals (MULTIVITAMIN & MINERAL PO) Take 1 tablet by mouth daily.        . Cholecalciferol (VITAMIN D) 1000 UNITS capsule Take 1,000 Units by mouth daily.        . sertraline (ZOLOFT) 25 MG tablet Take 1 tablet (25 mg total) by mouth daily.  90 tablet  3   Allergies  Allergen Reactions  . Lansoprazole     REACTION: headaches  . Nitrofurantoin     REACTION: severe sob---causes flu like symptoms  . Penicillins     REACTION: rash  . Sulfamethoxazole W/Trimethoprim     Current Medications, Allergies, Past Medical History, Past Surgical History, Family History, and Social History were reviewed in [employer] record.    Review of Systems  Constitutional:   No  weight loss, night sweats,  fevers or chills.  + fatigue  lassitude.  HEENT:   No headaches, difficulty swallowing, tooth/dental problems, sore throat, sneezing, itching, ear ache, nasal congestion or post nasal drip  CV:  No chest pain, orthopnea, PND, swelling in lower extremities, anasarca, dizziness, palpitations, or syncope.   GI:  No heartburn, indigestion, abdominal  pain, nausea, vomiting, diarrhea, constipation, changes in bowel habits, loss of appetite or bloody stools.   Resp: No shortness of breath with exertion or at rest. No cough. No wheezing.  No chest wall deformity  Skin: No rash or lesions. +hair loss  GU: No dysuria. No change in color of urine. No urgency or frequency.  No flank pain. No hematuria   MS:  No joint pain or swelling.  No decreased range of motion.  No back pain.  Psych:  Decreased mood & energy. No anhedonia. No memory loss.    Objective:   Physical Exam  GEN: 61 yo female, alert & oriented x3, well nourished, slightly anxious, NAD.    HEENT:  [place]/AT,  EACs-clear, TMs-wnl, NOSE-clear, THROAT-clear, no lesions, no postnasal drip or exudate noted.   NECK:  Supple w/ full ROM. No JVD.  Normal carotid impulses w/o bruits. No thyromegaly or nodules. No lymphadenopathy.  RESP  Clear and equal in all fields with NO wheezes, rales, or rhonchi.No accessory muscle use.   CARD:  RRR. No MRG. No peripheral edema. Pulses intact. No cyanosis or clubbing.  GI:   Soft & nontender. Bowel sounds present in all quadrants. No organomegaly or masses detected.  Musco: Warm bil, no deformities or joint swelling noted.   Neuro: alert, no focal deficits noted.    Skin: Warm, no lesions or rashes   Assessment & Plan:  Fatigue with unclear etiology.  Thinning hair -will check labs with vitamin d, B12 along with TSH  Possibly related to ?depression   Plan:  Will check labs today, CBC, BMET, TSH Follow up Dr. Kriste Basque  In 1 month as scheduled and as needed

## 2011-06-17 ENCOUNTER — Telehealth: Payer: Self-pay | Admitting: Adult Health

## 2011-06-17 NOTE — Telephone Encounter (Signed)
I spoke with patient about results and she verbalized understanding and had no questions. Pt is wanting her lab results mailed to her home. i verified address and it was correct. I have placed this in the mail and nothing further was needed

## 2011-07-07 ENCOUNTER — Other Ambulatory Visit: Payer: Self-pay | Admitting: Pulmonary Disease

## 2011-07-19 ENCOUNTER — Ambulatory Visit: Payer: BC Managed Care – PPO | Admitting: Pulmonary Disease

## 2011-08-29 ENCOUNTER — Encounter: Payer: Self-pay | Admitting: Vascular Surgery

## 2011-08-30 ENCOUNTER — Encounter: Payer: Self-pay | Admitting: Vascular Surgery

## 2011-08-30 ENCOUNTER — Ambulatory Visit (INDEPENDENT_AMBULATORY_CARE_PROVIDER_SITE_OTHER): Payer: BC Managed Care – PPO | Admitting: Vascular Surgery

## 2011-08-30 VITALS — BP 167/77 | HR 66 | Resp 18 | Ht 62.0 in | Wt 146.0 lb

## 2011-08-30 DIAGNOSIS — I83893 Varicose veins of bilateral lower extremities with other complications: Secondary | ICD-10-CM

## 2011-08-30 NOTE — Progress Notes (Signed)
The patient presents today for continued discussion regarding her dramatic venous hypertension. She had been seen 3 months ago which time venous duplex revealed significant reflux in both great saphenous vein. She also has mild reflux in her small saphenous vein bilaterally. She's had no symptoms related with thigh-high graduated compression stockings 20-30 mm mercury. She also continues to use ibuprofen with no symptomatic relief. 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 She reports this as an achy sensation was made worse with prolonged standing.  Past Medical History  Diagnosis Date  . Hypertension   . Atherosclerosis of other specified arteries   . Venous insufficiency   . Hiatal hernia   . GERD (gastroesophageal reflux disease)   . IBS (irritable bowel syndrome)   . Hemorrhoids   . UTI (lower urinary tract infection)   . Osteopenia   . Low back pain   . Anemia   . Bilateral carpal tunnel syndrome     History  Substance Use Topics  . Smoking status: Never Smoker   . Smokeless tobacco: Never Used  . Alcohol Use: No    Family History  Problem Relation Age of Onset  . Stroke Mother   . COPD Father     Allergies  Allergen Reactions  . Lansoprazole     REACTION: headaches  . Nitrofurantoin     REACTION: severe sob---causes flu like symptoms  . Penicillins     REACTION: rash  . Sulfamethoxazole W-Trimethoprim     Current outpatient prescriptions:calcium gluconate 500 MG tablet, Take 500 mg by mouth daily.  , Disp: , Rfl: ;  Cholecalciferol (VITAMIN D) 1000 UNITS capsule, Take 1,000 Units by mouth daily.  , Disp: , Rfl: ;  folic acid (FOLVITE) 1 MG tablet, Take 1 mg by mouth daily.  , Disp: , Rfl: ;  metoprolol (TOPROL-XL) 50 MG 24 hr tablet, Take 1 tablet (50 mg total) by mouth daily., Disp: 90 tablet, Rfl: 3 Multiple Vitamins-Minerals (MULTIVITAMIN & MINERAL PO), Take 1 tablet by mouth  daily.  , Disp: , Rfl: ;  NEXIUM 40 MG capsule, TAKE ONE CAPSULE BY MOUTH DAILY, Disp: 90 capsule, Rfl: 2;  sertraline (ZOLOFT) 25 MG tablet, Take 1 tablet (25 mg total) by mouth daily., Disp: 90 tablet, Rfl: 3  BP 167/77  Pulse 66  Resp 18  Ht 5\' 2"  (1.575 m)  Wt 146 lb (66.225 kg)  BMI 26.70 kg/m2  Body mass index is 26.70 kg/(m^2).       Physical exam: Well-developed well-nourished female in no acute distress. She does have 2+ dorsalis pedis pulses bilaterally. She has no evidence of venous varicosities. I reimaged her saphenous vein with SonoSite ultrasound bilateral this does show dilatation and reflux.  Feel she is clearly failed conservative treatment of her venous hypertension. I have recommended staged bilateral laser ablation of her great saphenous vein for improvement in her venous hypertension related to great saphenous vein reflux. She understands this is an outpatient procedure under local anesthesia. She has more difficulty in her right leg and so we would treat her right leg first and then staged fashion treat her left leg. We will proceed at her convenience

## 2011-08-30 NOTE — Progress Notes (Signed)
Problems with Activities of Daily Living Secondary to Leg Pain  1. [name] states that cooking, cleaning, and shopping----any activities that require prolonged standing are very difficult for her due to leg pain         Failure of  Conservative Therapy:  1. Worn 20-30 mm Hg thigh high compression hose >3 months with no relief of symptoms.  2. Frequently elevates legs-no relief of symptoms  3. Taken Ibuprofen 600 Mg TID with no relief of symptoms.

## 2011-10-27 ENCOUNTER — Encounter: Payer: Self-pay | Admitting: Pulmonary Disease

## 2011-10-27 ENCOUNTER — Other Ambulatory Visit (INDEPENDENT_AMBULATORY_CARE_PROVIDER_SITE_OTHER): Payer: BC Managed Care – PPO

## 2011-10-27 ENCOUNTER — Ambulatory Visit (INDEPENDENT_AMBULATORY_CARE_PROVIDER_SITE_OTHER): Payer: BC Managed Care – PPO | Admitting: Pulmonary Disease

## 2011-10-27 VITALS — BP 150/80 | HR 63 | Temp 97.6°F | Ht 61.0 in | Wt 143.8 lb

## 2011-10-27 DIAGNOSIS — I708 Atherosclerosis of other arteries: Secondary | ICD-10-CM

## 2011-10-27 DIAGNOSIS — I872 Venous insufficiency (chronic) (peripheral): Secondary | ICD-10-CM

## 2011-10-27 DIAGNOSIS — I10 Essential (primary) hypertension: Secondary | ICD-10-CM

## 2011-10-27 DIAGNOSIS — E041 Nontoxic single thyroid nodule: Secondary | ICD-10-CM

## 2011-10-27 DIAGNOSIS — K589 Irritable bowel syndrome without diarrhea: Secondary | ICD-10-CM

## 2011-10-27 DIAGNOSIS — M545 Low back pain: Secondary | ICD-10-CM

## 2011-10-27 DIAGNOSIS — K219 Gastro-esophageal reflux disease without esophagitis: Secondary | ICD-10-CM

## 2011-10-27 DIAGNOSIS — I83893 Varicose veins of bilateral lower extremities with other complications: Secondary | ICD-10-CM

## 2011-10-27 LAB — LIPID PANEL
Cholesterol: 191 mg/dL (ref 0–200)
HDL: 48.7 mg/dL (ref >39.00)
Triglycerides: 89 mg/dL (ref 0.0–149.0)
VLDL: 17.8 mg/dL (ref 0.0–40.0)

## 2011-10-27 LAB — HEPATIC FUNCTION PANEL
ALT: 18 U/L (ref 0–35)
Albumin: 4.5 g/dL (ref 3.5–5.2)
Total Protein: 7.5 g/dL (ref 6.0–8.3)

## 2011-10-27 NOTE — Patient Instructions (Addendum)
Today we updated your med list in our EPIC system...    Continue your current medications the same...  Today we did your fasting lipid profile>    We will call you w/ the results...  Keep up the great job w/ diet & exercise...    Monitor your BP at home checking it once or twice per week...    Call me if the BP is 150/90 or higher on a regular basis...  Call for any questions...  Let's plan a follow up visit in 6 months.Marland KitchenMarland Kitchen

## 2011-10-27 NOTE — Progress Notes (Signed)
Subjective:    Patient ID: [name], female    DOB: [birth year], 61 y.o.   MRN: [id number]  HPI 61 y/o Asian woman here for a follow up visit... she has mult medical problems as noted below...   ~  July 29, 2009:  she reports a difficult 60mo- saw DrGessner for her chr upper abd discomfort & she reports Nexium w/o benefit, intol Dexilant (made her sleepy)... he did another EGD 3/11- mild gastritis, otherw neg, HPylori neg>> she will try Prilosec OTC vs Prevacid OTC (take Bid before meals)... she would like CAT scan to be sure there is nothing going on in there (CT neg x mild atherosclerosis, DDD).    She also c/o mult side effects from her BP meds- intol Avalide w/ photosens reaction even thru her clothes, Micardis affected her vision, and Diovan caused constipation... for now we will try monotherapy w/ Metoprolol 25-50mg  /d (she has tol this xyrs)...  ~  October 26, 2009:  CT Abd 3/11 showed mild atherosclerotic changes in Ao, DDD L5-S1, NAD.Marland Kitchen. subseq CTA by DrGessner 6/11 showed atherosclerotic changes at origin of SMA & Celiac w/o obstruction, & mod dis at origin of IMA which is very diminutive... renal arts OK, Ao is nonaneurysmal, & otherw neg...  her chr abd symptoms sound functional & we discussed trial Align, Activia, Gluten free diet, etc... she indicates that DrGessner may refer her to [facility]...  ~  April 21, 2010:  she tells me that a [place] doctor via web site rec pureed organic cabbage (to strengthen the stomach lining) & she drinks this daily & is feeling better... they also suggested a puppy to help w/ her BP & she has a [place] mix named "Dia Sitter" now & she notes BP doing satis... she is back on Nexium daily & taking a round of Xifaxan per [employer]... we reviewed prev scans and labs.  ~  November 30, 2010:  4mo ROV & she has stopped the organic cabbage drink "I got sick of it" & back to taking Nexium 40mg  about every other day she says;  BP controlled on meds (w/ [name] help) & feeling well  in general;  She notes some left hip pain & has been trying yoga;  She also had GYN check 6/12 w/ labs done & she brought copies for our records> FLP=good; Chems=wnl, HgA1c=5.7, TSH=0.67, Atypia on PAP & DrTaavon is following;  They recommended Folic Acid daily...  ~  April 20, 2011:  60mo ROV & [name] just returned from a visit to [place] to visit relatives> she had a fabulously thorough medical screening eval at the [employer] by a group called [facility]:  LABS- all wnl x elev CRP & +UTI (not treated- we repeated C&S here- +EColi, pansens & we will treat w/ CIPRO);  +Immunity to HepB;  PAP w/ few atypic cells & she has appt for f/u w/ GYN 1/13;  AbdSonar showed fatty liver & calcif in right lobe, 1.3cm cyst on right kidney, TAH> they rec f/u Sonar ~82mo;  Thyroid Sonar showed 0.9cm nodule & 0.4cm cyst on right side w/ f/u Sonar rec~573mo;  LumbarCT showed diffuse bulging L4-5, degen changes L5-S1 & she feels that Yoga is helping her back;  BrainMRI reported normal...    HBP> on MetoprololER 50mg /d & BP was 115/73 in [place], measures 158/92 here today & we decided to continue same + no salt, etc...    PAD/ ASPVD> she is asked to resume her ASA 81mg /d; prev CTA revealed  atherosclerotic changes at origin of SMA, Celiac art, IMA w/o obstruction; she remains asymptomatic w/o abd pain, N/V, difficulty when eating, etc...    ?Thyroid nodule> 9mm right lobe nodule seen on ultrasound in [place] 11/12 & we will repeat in 6 months; not palp on exam; clinically 7 biochem euthyroid...    GI> HH/ GERD/ IBS> on Nexium40; she has had extensive work up by [employer] (see below) felt to have functional complaints & felt improved on pureed organic cabbage- now back on the Nexium...    UTI> she was said to have a lower urinary infection on her work up in [place] but they didn't treat it; repeat UA & C/S here is pos for [employer] & we will treat w/ Cipro250Bid...    Ortho> LBP, Hx left hip pain, bilat CTS> [place]  eval showed diffuse bulging disc at L4-5 & she states that she is much improved w/ Yoga exercises...    ?Depression/ Nerves> she was started on some medication in [place] (Stablon 12.5mg Bid & Enafon 10mg Qhs) & we decided to try SERTALINE 25mg /d==> she never took it.  ~  October 27, 2011:  60mo ROV & [name] is improved & states that her chronic abd symptoms are aloy better- prev due to cabbage juice rec by a [place] doctor, now due to diet adjust (no spicey/greasy foods... She had VV eval by DrEarly, [facility] & he rec surgery but sh'e holding off for now...  She has remained active- plays golf, walking, & Yoga... She is due for f/u Thyroid & Abd sonar in follow up from her screening eval in [place] last yr, but she wants to hold off for now & I feel this would be ok (no abd symptoms now & no palp thyroid abn either)...  She is overall improved & she feels that the key to everything is her diet... We reviewed prob list, meds, xrays and labs> see below>>          Problem List:  HYPERTENSION (ICD-401.9) - on TOPROL XL 50mg /d; & INTOL to Avalide, Micardis, Diovan she says...  ~  7/12: BP on monotherapy = 126/80 & similar at home... denies HA, fatigue, visual changes, CP, palipit, dizziness, syncope, dyspnea, edema, etc... ~  12/12: BP= 158/92 but was recently 115/73 in [place] & we will continue same med & monitor BP at home... ~  6/13:  BP= 150/80 after rest & she says ok at home; reminded to avoid all sodium & soy sauce etc...  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 CTAngio 6/11 showed atherosclerotic changes at origin of SMA & Celiac w/o obstruction, mod dis at origin of IMA which is very diminutive, renal arts OK, Ao is nonaneurysmal, & otherw neg exam... ~  She remains asymptomatic w/o abd symptoms or mealtime symptoms related to this atherosclerosis...  VENOUS INSUFFICIENCY (ICD-459.81) - she follows a low sodium diet, elevates legs, & wears support hose as needed...  ~  12/12:  she worries about her veins and would like to see a vein specialist... ~  4/13:  She had eval from DrEarly, [facility]- he rec surgery but she is holding off for now...  HYPERLIPIDEMIA, borderline >> on diet management alone... 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 she has chr functional GI complaints <see above> she was very concerned  about a friend who was diagnosed w/ gastric cancer- he apparently was a smoker but had no greater symptoms than she has had & she wonders about screening for early detection (see below)... She took pureed organic cabbage per [place] medicine web site for many months & felt improved, but she got sick of it & restarted NEXIUM. 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  ~  10/09 f/u w/ DrGessner- EGD showed 2cmHH, otherw neg- she was reassured. ~  12/10: she remains on Nexium doing well... 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 repeat EGD by DrGessner showed mild gastritis, neg HPylori. ~  She subseq started pureed organic cabbage per [place] medical web site & felt better; later switched back to Nexium when she got sick of the cabbage. ~  She has adjusted her diet to elim spicey & greasy food; she feels that diet is the key to everything...  IRRITABLE BOWEL SYNDROME (ICD-564.1) & HEMORRHOIDS (ICD-455.6) - last colon 2/08 w/ small int hems only...  ~  12/10: she had outpt hem surg by DrIngram...  HX, URINARY INFECTION (ICD-V13.02) - hx urinary symptoms and referred by GYN to Urology w/ cystoscopy 6/09 by DrPeterson= normal... she was tried on [employer] [facility]...  ~  Otilio Carpen lower urinary tract infections> had abn UA during work up in [place], [place] & C/S repeated here & pos for [facility] UTI (pansens) treated w/ Cipro...  Hx of LOW BACK PAIN - she had prev epid steroid shots in 1996... CT Abd 3/11 showed some DDD in  lumbar area. ~  Lumbar CT in [place] 11/12 reported diffuse bulging disc at L4-5 & pt states pain is diminished w/ Yoga exercises... Left Hip Pain> she reports some left hip pain & she is doing Yoga w/ improvement... Hx bilateral CARPAL TUNNEL SYNDROME - she had prev surgery on right- 2/07, and left- 9/09 by DrSypher...  OSTEOPENIA (ICD-733.90) - she takes Calcium, MVI, Vit D... followed by GYN. ~  BMD 2010 at [facility] showed TScores -1.8 in Spine, & -1.3 in right [facility]. ~  She reports f/u BMD 7/12 at GYN office> results pending...  ?Depression/ Nerves> she was started on some medication in [place] (Stablon 12.5mg Bid & Enafon 10mg Qhs) & we decided to try SERTALINE 25mg /d...  Hx of ANEMIA-NOS (ICD-285.9) ~  labs 5/09 by GYN showed Hg= 13.5 ~  labs here 6/10 showed Hg= 13.5 ~  labs here 3/11 showed Hg= 13.5 ~  Labs 2/13 showed Hg= 14.1   Past Surgical History  Procedure Date  . Abdominal hysterectomy   . Bilateral carpal tunnel release 01/2008 and 06/2005    Dr. Teressa Senter  . Left cataract and lens 07/2008    Dr. Charlotte Sanes  . Hemorrhoid sugery 04/2009    Dr. Derrell Lolling  . Breast reduction surgery 1999    Outpatient Encounter Prescriptions as of 10/27/2011  Medication Sig Dispense Refill  . Calcium-Vitamin D-Vitamin K (CALCIUM + D) [phone] MG-UNT-MCG CHEW Chew 1 tablet by mouth daily.      . Cholecalciferol (VITAMIN D) 1000 UNITS capsule Take 2,000 Units by mouth daily.       . folic acid (FOLVITE) 1 MG tablet Take 1 mg by mouth daily.        . metoprolol (TOPROL-XL) 50 MG 24 hr tablet Take 1 tablet (50 mg total) by mouth daily.  90 tablet  3  . Multiple Vitamins-Minerals (MULTIVITAMIN & MINERAL PO) Take 1 tablet by mouth daily.        Marland Kitchen  NEXIUM 40 MG capsule TAKE ONE CAPSULE BY MOUTH DAILY  90 capsule  2  . DISCONTD: calcium gluconate 500 MG tablet Take 500 mg by mouth daily.        Marland Kitchen DISCONTD: sertraline (ZOLOFT) 25 MG tablet Take 1 tablet (25 mg total) by mouth daily.  90 tablet  3     Allergies  Allergen Reactions  . Lansoprazole     REACTION: headaches  . Penicillins     REACTION: rash  . Sulfamethoxazole W-Trimethoprim     Causes SOB and flu like symptoms    Current Medications, Allergies, Past Medical History, Past Surgical History, Family History, and Social History were reviewed in [employer] record.    Review of Systems    See HPI - all other systems neg except as noted... The patient denies anorexia, fever, weight loss, weight gain, vision loss, decreased hearing, hoarseness, chest pain, syncope, dyspnea on exertion, peripheral edema, prolonged cough, headaches, hemoptysis, abdominal pain, melena, hematochezia, severe indigestion/heartburn, hematuria, incontinence, muscle weakness, suspicious skin lesions, transient blindness, difficulty walking, depression, unusual weight change, abnormal bleeding, enlarged lymph nodes, and angioedema.    Objective:   Physical Exam     WD, WN, 61 y/o F in NAD... GENERAL:  Alert & oriented; pleasant & cooperative... HEENT:  [place]/AT, EOM-wnl,  PERRLA, EACs-wax, TMs- not vis, NOSE-clear, THROAT-clear & wnl. NECK:  Supple w/ fairROM; no JVD; normal carotid impulses w/o bruits; no thyromegaly or nodules palpated; no lymphadenopathy. CHEST:  Clear w/o wheezing, rales, rhonchi... HEART:  Regular Rhythm; without murmurs/ rubs/ or gallops... ABDOMEN:  Soft & nontender; normal bowel sounds; no organomegaly or masses detected. EXT: without deformities or arthritic changes; no varicose veins/ +venous insuffic/ no edema. NEURO:  intact w/ no focal deficits noted DERM:  No lesions noted; no rash etc...   Assessment & Plan:   HBP>  Controlled on BBlocker, continue same...  Atherosclerotic changes in visceral arts on CTAngio in 2011>  rec to take ASA 81mg /d...  Venous Insuffic>  Aware, see eval from DrEarly 4/13- she is holding off on surg; rec no salt, elevation, support hose, etc...  GI> HH, GERD,  IBS, Hems>  Back on PPI rx & taking it Qod now; had colon 2008 w/ hems only...  GU> Hx UTIs, Hx voiding problems>  Prev eval by DrPeterson, Urology...  Ortho>  She is doing Yoga & improved...  Osteopenia>  BMDs & rx per GYN...  ?Depression/ Nerves> she was started on some medication in [place] (Stablon 12.5mg Bid & Enafon 10mg Qhs) & we decided to try SERTALINE 25mg /d but she never took it.   Patient's Medications  New Prescriptions   No medications on file  Previous Medications   CALCIUM-VITAMIN D-VITAMIN K (CALCIUM + D) [phone] MG-UNT-MCG CHEW    Chew 1 tablet by mouth daily.   CHOLECALCIFEROL (VITAMIN D) 1000 UNITS CAPSULE    Take 2,000 Units by mouth daily.    FOLIC ACID (FOLVITE) 1 MG TABLET    Take 1 mg by mouth daily.     METOPROLOL (TOPROL-XL) 50 MG 24 HR TABLET    Take 1 tablet (50 mg total) by mouth daily.   MULTIPLE VITAMINS-MINERALS (MULTIVITAMIN & MINERAL PO)    Take 1 tablet by mouth daily.     NEXIUM 40 MG CAPSULE    TAKE ONE CAPSULE BY MOUTH DAILY  Modified Medications   No medications on file  Discontinued Medications   CALCIUM GLUCONATE 500 MG TABLET    Take 500 mg  by mouth daily.     SERTRALINE (ZOLOFT) 25 MG TABLET    Take 1 tablet (25 mg total) by mouth daily.

## 2011-10-28 ENCOUNTER — Telehealth: Payer: Self-pay | Admitting: Pulmonary Disease

## 2011-10-28 NOTE — Progress Notes (Signed)
Quick Note:  Spoke with pt. She is aware of lab results and recs per Dr. Kriste Basque. Pls see phone msg from 10/28/11 for additional information. ______

## 2011-10-28 NOTE — Telephone Encounter (Signed)
Notes Recorded by Michele Mcalpine, MD on 10/28/2011 at 8:48 AM Please notify patient>  FLP is ok x LDL=125 7 goal <100... Needs better low chol diet & exercise...      ---  Called, spoke with pt.  I informed her of above results and recs per Dr. Kriste Basque.  Pt states she does exercises and is already on a "strict diet."  Reports she eats oriental foods, fish, salom, cabbage, veggies, steamed rice, and boiled eggs.  Reports she does not eat fried foods.  She would like a brochure of foods that she should be eating mailed to her.  Leigh, do you'll have something like this that we can send pt?  Pls advise.  Thank you.

## 2011-10-28 NOTE — Telephone Encounter (Signed)
Low chol low fat diet has been placed in the mail to the pt.

## 2011-11-23 ENCOUNTER — Other Ambulatory Visit: Payer: Self-pay | Admitting: *Deleted

## 2011-11-23 DIAGNOSIS — I83893 Varicose veins of bilateral lower extremities with other complications: Secondary | ICD-10-CM

## 2011-12-14 ENCOUNTER — Encounter: Payer: Self-pay | Admitting: Vascular Surgery

## 2011-12-14 ENCOUNTER — Telehealth: Payer: Self-pay | Admitting: Pulmonary Disease

## 2011-12-14 NOTE — Telephone Encounter (Signed)
Called, spoke with pt who states she applied for [employer] but was declined because of "something with the thyroid."  Pt would like to bring this letter to Dr. Kriste Basque today so he can review it.  After reviewing it, would like to know if he will complete letter for the insurance co.  Will forward msg to Marliss Czar so she can advise when letter is received.

## 2011-12-15 ENCOUNTER — Ambulatory Visit (INDEPENDENT_AMBULATORY_CARE_PROVIDER_SITE_OTHER): Payer: BC Managed Care – PPO | Admitting: Vascular Surgery

## 2011-12-15 ENCOUNTER — Encounter: Payer: Self-pay | Admitting: Vascular Surgery

## 2011-12-15 VITALS — BP 134/80 | HR 77 | Resp 18 | Ht 62.0 in | Wt 142.0 lb

## 2011-12-15 DIAGNOSIS — I83893 Varicose veins of bilateral lower extremities with other complications: Secondary | ICD-10-CM

## 2011-12-15 HISTORY — PX: ENDOVENOUS ABLATION SAPHENOUS VEIN W/ LASER: SUR449

## 2011-12-15 NOTE — Progress Notes (Signed)
Laser Ablation Procedure      Date: 12/15/2011    [name] DOB:Aug 10, 1950  Consent signed: Yes  Surgeon:T.F. Bernedette Auston  Procedure: Laser Ablation: right Greater Saphenous Vein  BP 134/80  Pulse 77  Resp 18  Ht 5\' 2"  (1.575 m)  Wt 142 lb (64.411 kg)  BMI 25.97 kg/m2  Start time: 10:30am   End time: 11:20AM  Tumescent Anesthesia: 400 cc 0.9% NaCl with 50 cc Lidocaine HCL with 1% Epi and 15 cc 8.4% NaHCO3  Local Anesthesia: 3 cc Lidocaine HCL and NaHCO3 (ratio 2:1)  Continuous Mode: 15 WATTS Total Energy 2291 JOULES Total Time2:32       Patient tolerated procedure well: Yes  [name], [name]  Description of Procedure:  After marking the course of the saphenous vein and the secondary varicosities in the standing position, the patient was placed on the operating table in the supine position, and the right leg was prepped and draped in sterile fashion. Local anesthetic was administered, and under ultrasound guidance the saphenous vein was accessed with a micro needle and guide wire; then the micro puncture sheath was placed. A guide wire was inserted to the saphenofemoral junction, followed by a 5 french sheath.  The position of the sheath and then the laser fiber below the junction was confirmed using the ultrasound and visualization of the aiming beam.  Tumescent anesthesia was administered along the course of the saphenous vein using ultrasound guidance. Protective laser glasses were placed on the patient, and the laser was fired at at 15 watt continuous mode.  For a total of 2291 joules.  A steri strip was applied to the puncture site.    ABD pads and thigh high compression stockings were applied.  Ace wrap bandages were applied  at the top of the saphenofemoral junction.  Blood loss was less than 15 cc.  The patient ambulated out of the operating room having tolerated the procedure well.

## 2011-12-16 ENCOUNTER — Encounter: Payer: Self-pay | Admitting: Vascular Surgery

## 2011-12-16 ENCOUNTER — Telehealth: Payer: Self-pay | Admitting: *Deleted

## 2011-12-16 NOTE — Telephone Encounter (Signed)
12/16/2011  Time: 10:13 AM   Patient Name: [name]  Patient of: T.F. Early  Procedure:Laser Ablation right greater saphenous vein 12-15-2011   Reached patient at home and checked  Her status  Yes    Comments/Actions Taken: Mrs. Gunnoe states she has mild/moderate pain in her right inner thigh relieved by Ibuprofen and swelling on the top of her foot and toes.  No complaints of ankle or foot pain.  Mrs. Massing states she is able to wear her shoes.  Encouraged her to elevate right leg/foot and to call [facility] if swelling increased or if she experienced pain in right leg/foot other than right inner thigh. Reviewed post procedural instructions with Mrs. Cordts and reminded her of post laser ablation duplex and fu appointment with Dr. Arbie Cookey on 12-22-2011.  [name], [name]    @SIGNATURE @

## 2011-12-19 ENCOUNTER — Encounter: Payer: Self-pay | Admitting: Pulmonary Disease

## 2011-12-19 NOTE — Telephone Encounter (Signed)
SN has completed the letter for the pt and this has been mailed to the pt with a couple of copies for the pt.  Nothing further is needed.

## 2011-12-20 ENCOUNTER — Telehealth: Payer: Self-pay | Admitting: *Deleted

## 2011-12-20 NOTE — Telephone Encounter (Signed)
[name] c/o of itching,redness, and warmth along right inner thigh and at top of groin where silicone beading is located.  [name] is s/p 12-15-2011 endovenous laser ablation right greater saphenous vein.  [name] states she is using Cortisone cream to the affected area and rolling down her compression hose where silicone beading does not come into contact with her skin. Encouraged [name] to continue Ibuprofen 600mg  po three times daily with food and to use ice packs to affected area as needed.    [name] has post laser ablation duplex and follow up with Dr. Arbie Cookey on 12-22-2011.  Encouraged [name] to call [facility] if symptoms worsen or if she has further concerns.  [name], [name]

## 2011-12-21 ENCOUNTER — Encounter: Payer: Self-pay | Admitting: Vascular Surgery

## 2011-12-22 ENCOUNTER — Encounter: Payer: Self-pay | Admitting: Vascular Surgery

## 2011-12-22 ENCOUNTER — Other Ambulatory Visit: Payer: Self-pay | Admitting: *Deleted

## 2011-12-22 ENCOUNTER — Telehealth: Payer: Self-pay | Admitting: Pulmonary Disease

## 2011-12-22 ENCOUNTER — Ambulatory Visit (INDEPENDENT_AMBULATORY_CARE_PROVIDER_SITE_OTHER): Payer: BC Managed Care – PPO | Admitting: Vascular Surgery

## 2011-12-22 ENCOUNTER — Encounter (INDEPENDENT_AMBULATORY_CARE_PROVIDER_SITE_OTHER): Payer: BC Managed Care – PPO | Admitting: *Deleted

## 2011-12-22 VITALS — BP 174/81 | HR 69 | Resp 18 | Ht 62.0 in | Wt 142.0 lb

## 2011-12-22 DIAGNOSIS — I83893 Varicose veins of bilateral lower extremities with other complications: Secondary | ICD-10-CM

## 2011-12-22 DIAGNOSIS — Z48812 Encounter for surgical aftercare following surgery on the circulatory system: Secondary | ICD-10-CM

## 2011-12-22 NOTE — Progress Notes (Signed)
The patient presents for one week followup of laser ablation of right great saphenous vein. She did have some irritation from the silicone beads around the upper portion of her thigh high compression garment. She had minimal erythema and very mild bruising. She has been walking and feels that she does have some improvement from the pressure in her right calf. I explained the right several weeks for the soreness is completely gone determine the maximal benefit.  Venous duplex today reveals closure of her great saphenous vein from the insertion site to just below the saphenofemoral junction. She has no evidence of DVT  Impression and plan: Successful ablation of right great saphenous vein. The patient wishes to proceed with similar treatment to her left venous hypertension and this will be scheduled in mid September. He will notify should she develop any difficulty. Otherwise she will continue compression on the right leg for an additional one week

## 2011-12-22 NOTE — Telephone Encounter (Signed)
Spoke with pt. She states that she really appreciated the letter that SN wrote regarding her thyroid problems and long term care, but now the insurance is needing letter regarding her abdominal problems as well. When I asked what the letter needed to state, she says "Dr. Kriste Basque will know". Please advise thanks!

## 2011-12-27 ENCOUNTER — Telehealth: Payer: Self-pay | Admitting: Pulmonary Disease

## 2011-12-27 DIAGNOSIS — E041 Nontoxic single thyroid nodule: Secondary | ICD-10-CM

## 2011-12-27 DIAGNOSIS — R109 Unspecified abdominal pain: Secondary | ICD-10-CM

## 2011-12-27 NOTE — Telephone Encounter (Signed)
Called and spoke with  Colon Branch about the long term care insurance that the pt is trying to get but was denied for due to issues with abd [place] and thyroid US and an ov note from SN that stated that  She has fatigue without an explanation.  Alvino Chapel is going to call the pt and let her know that she can apply for an extension for up to 6 months if the pt wants to do this or she will have to repeat these 2 ultrasounds.  Alvino Chapel will call me back and let me know what the pt decides to do.

## 2011-12-28 NOTE — Telephone Encounter (Signed)
This is the 2nd phone note for this pt about the same letter.  Will sign off of this phone note and hold the other note until the decision is made abou the letter needed.

## 2011-12-29 NOTE — Telephone Encounter (Signed)
Alvino Chapel called and stated that she spoke with the pt and the pt would like to go ahead and schedule for the US thyroid and the abdominal US.  SN are you ok to schedule these for the pt?   Alvino Chapel also stated that if once we get these results back and could write a letter addressing the fatigue and the results of the 2 [place] and send this to the pt.  thanks

## 2012-01-06 NOTE — Telephone Encounter (Signed)
Please advise PCC;s thanks 

## 2012-01-06 NOTE — Telephone Encounter (Signed)
Abdominal US and US Thyroid scheduled for Wed 01/11/12 at 8:30 at [facility] [address] Called and spoke with patient and she is aware of both appointments with directions NPO after midnight. Pt advised to arrive at 8:15. Rhonda J Cobb

## 2012-01-06 NOTE — Telephone Encounter (Signed)
Patient calling to check on the status of thyroid and abd [place].  She states she was to be called with appt date.

## 2012-01-06 NOTE — Telephone Encounter (Signed)
Orders have been placed and we will call the pt once these have been scheduled.  thanks

## 2012-01-11 ENCOUNTER — Ambulatory Visit
Admission: RE | Admit: 2012-01-11 | Discharge: 2012-01-11 | Disposition: A | Discharge status: Home / Self Care | Payer: BC Managed Care – PPO | Source: Ambulatory Visit | Attending: Pulmonary Disease | Admitting: Pulmonary Disease

## 2012-01-11 DIAGNOSIS — E041 Nontoxic single thyroid nodule: Secondary | ICD-10-CM

## 2012-01-11 DIAGNOSIS — R109 Unspecified abdominal pain: Secondary | ICD-10-CM

## 2012-01-12 ENCOUNTER — Telehealth: Payer: Self-pay | Admitting: Pulmonary Disease

## 2012-01-12 ENCOUNTER — Encounter: Payer: Self-pay | Admitting: Pulmonary Disease

## 2012-01-12 NOTE — Telephone Encounter (Signed)
Both letters have been faxed to the 2 different fax numbers per pt. Placed back in the mail to the pt.  Nothing further is needed.

## 2012-01-12 NOTE — Telephone Encounter (Signed)
Leigh, can you please have SN sign the letter so we can fax thanks!!

## 2012-01-25 ENCOUNTER — Encounter: Payer: Self-pay | Admitting: Vascular Surgery

## 2012-01-26 ENCOUNTER — Encounter: Payer: Self-pay | Admitting: Vascular Surgery

## 2012-01-26 ENCOUNTER — Ambulatory Visit (INDEPENDENT_AMBULATORY_CARE_PROVIDER_SITE_OTHER): Payer: BC Managed Care – PPO | Admitting: Vascular Surgery

## 2012-01-26 VITALS — BP 131/75 | HR 75 | Resp 18 | Ht 62.0 in | Wt 142.0 lb

## 2012-01-26 DIAGNOSIS — I83893 Varicose veins of bilateral lower extremities with other complications: Secondary | ICD-10-CM

## 2012-01-26 HISTORY — PX: ENDOVENOUS ABLATION SAPHENOUS VEIN W/ LASER: SUR449

## 2012-01-26 NOTE — Progress Notes (Signed)
Laser Ablation Procedure      Date: 01/26/2012    [name] DOB:08-19-1950  Consent signed: Yes  Surgeon:T.F. Jamaiyah Pyle  Procedure: Laser Ablation: left Greater Saphenous Vein  BP 131/75  Pulse 75  Resp 18  Ht 5\' 2"  (1.575 m)  Wt 142 lb (64.411 kg)  BMI 25.97 kg/m2  Start time: 10:40AM   End time: 11:25 AM  Tumescent Anesthesia: 400 cc 0.9% NaCl with 50 cc Lidocaine HCL with 1% Epi and 15 cc 8.4% NaHCO3  Local Anesthesia: 4 cc Lidocaine HCL and NaHCO3 (ratio 2:1)  Continuous Mode: 15 Watts Total Energy 2423 Joules Total Time2:41       Patient tolerated procedure well: Yes    Description of Procedure:  After marking the course of the saphenous vein and the secondary varicosities in the standing position, the patient was placed on the operating table in the supine position, and the left leg was prepped and draped in sterile fashion. Local anesthetic was administered, and under ultrasound guidance the saphenous vein was accessed with a micro needle and guide wire; then the micro puncture sheath was placed. A guide wire was inserted to the saphenofemoral junction, followed by a 5 french sheath.  The position of the sheath and then the laser fiber below the junction was confirmed using the ultrasound and visualization of the aiming beam.  Tumescent anesthesia was administered along the course of the saphenous vein using ultrasound guidance. Protective laser glasses were placed on the patient, and the laser was fired at at 15 watt continuous mode.  For a total of 2423 joules.  A steri strip was applied to the puncture site.    ABD pads and thigh high compression stockings were applied.  Ace wrap bandages were applied  at the top of the saphenofemoral junction.  Blood loss was less than 15 cc.  The patient ambulated out of the operating room having tolerated the procedure well.

## 2012-01-30 ENCOUNTER — Encounter: Payer: Self-pay | Admitting: Vascular Surgery

## 2012-01-31 ENCOUNTER — Telehealth: Payer: Self-pay | Admitting: *Deleted

## 2012-01-31 ENCOUNTER — Encounter: Payer: Self-pay | Admitting: Vascular Surgery

## 2012-01-31 ENCOUNTER — Encounter (INDEPENDENT_AMBULATORY_CARE_PROVIDER_SITE_OTHER): Payer: BC Managed Care – PPO | Admitting: *Deleted

## 2012-01-31 ENCOUNTER — Ambulatory Visit (INDEPENDENT_AMBULATORY_CARE_PROVIDER_SITE_OTHER): Payer: BC Managed Care – PPO | Admitting: Vascular Surgery

## 2012-01-31 VITALS — BP 151/82 | HR 72 | Resp 18 | Ht 62.0 in | Wt 142.0 lb

## 2012-01-31 DIAGNOSIS — I83893 Varicose veins of bilateral lower extremities with other complications: Secondary | ICD-10-CM

## 2012-01-31 DIAGNOSIS — Z48812 Encounter for surgical aftercare following surgery on the circulatory system: Secondary | ICD-10-CM

## 2012-01-31 NOTE — Progress Notes (Signed)
Patient presents today for followup of her staged bilateral laser ablation of great saphenous vein. Most recent early this was her left leg 5 days ago. She's done quite well with the usual amount of erythema and mild discomfort. She is been extremely compliant with her compression and ibuprofen.  Venous duplex today reveals closure of her left great saphenous vein from the distal insertion site at her mid calf to within 1/2 cm of her saphenofemoral junction. There is no evidence of DVT  Impression and plan successful staged bilateral laser ablation of great saphenous vein for venous hypertension. The patient will continue to wear her left thigh high compression for one additional week and then will wear knee-high compression of a when necessary basis. We will see her on an as-needed basis

## 2012-01-31 NOTE — Telephone Encounter (Signed)
01/31/2012  Time: 8:51 AM   Patient Name: [name]  Patient of: T.F. Early  Procedure:Laser Ablation left grater saphenous vein  01-26-2012  Reached patient at home and checked  Her status  Yes    Comments/Actions Taken: [name] c/o mild /minimal pain and swelling left leg. States Ibuprofen and elevation relieved discomfort and mild swelling. Reviewed post procedural postprocedural instructions with [name].  Reminded her of post laser ablation duplex and follow up with dr. Arbie Cookey on 01-31-2012.       @SIGNATURE @

## 2012-04-06 ENCOUNTER — Encounter: Payer: Self-pay | Admitting: Pulmonary Disease

## 2012-04-06 ENCOUNTER — Other Ambulatory Visit: Payer: Self-pay | Admitting: Pulmonary Disease

## 2012-04-23 ENCOUNTER — Ambulatory Visit (INDEPENDENT_AMBULATORY_CARE_PROVIDER_SITE_OTHER): Payer: BC Managed Care – PPO | Admitting: Pulmonary Disease

## 2012-04-23 ENCOUNTER — Encounter: Payer: Self-pay | Admitting: Pulmonary Disease

## 2012-04-23 VITALS — BP 138/80 | HR 68 | Temp 97.7°F | Ht 61.5 in | Wt 150.0 lb

## 2012-04-23 DIAGNOSIS — Z87440 Personal history of urinary (tract) infections: Secondary | ICD-10-CM

## 2012-04-23 DIAGNOSIS — M545 Low back pain: Secondary | ICD-10-CM

## 2012-04-23 DIAGNOSIS — M899 Disorder of bone, unspecified: Secondary | ICD-10-CM

## 2012-04-23 DIAGNOSIS — K219 Gastro-esophageal reflux disease without esophagitis: Secondary | ICD-10-CM

## 2012-04-23 DIAGNOSIS — M949 Disorder of cartilage, unspecified: Secondary | ICD-10-CM

## 2012-04-23 DIAGNOSIS — K589 Irritable bowel syndrome without diarrhea: Secondary | ICD-10-CM

## 2012-04-23 DIAGNOSIS — I83893 Varicose veins of bilateral lower extremities with other complications: Secondary | ICD-10-CM

## 2012-04-23 DIAGNOSIS — I872 Venous insufficiency (chronic) (peripheral): Secondary | ICD-10-CM

## 2012-04-23 DIAGNOSIS — K449 Diaphragmatic hernia without obstruction or gangrene: Secondary | ICD-10-CM

## 2012-04-23 DIAGNOSIS — I708 Atherosclerosis of other arteries: Secondary | ICD-10-CM

## 2012-04-23 DIAGNOSIS — E789 Disorder of lipoprotein metabolism, unspecified: Secondary | ICD-10-CM

## 2012-04-23 DIAGNOSIS — I10 Essential (primary) hypertension: Secondary | ICD-10-CM

## 2012-04-23 NOTE — Patient Instructions (Addendum)
Today we updated your med list in our EPIC system...    Continue your current medications the same...  Call for any problems...  Let's plan a follow up visit in 6 months w/ FASTING blood work at that time... 

## 2012-04-23 NOTE — Progress Notes (Signed)
Subjective:    Patient ID: [name], female    DOB: 09-Oct-1950, 61 y.o.   MRN: [id number]  HPI 61 y/o [place] woman here for a follow up visit... she has mult medical problems as noted below...   ~  November 30, 2010:  559mo ROV & she has stopped the organic cabbage drink "I got sick of it" & back to taking Nexium 40mg  about every other day she says;  BP controlled on meds & feeling well in general;  She notes some left hip pain & has been trying yoga;  She also had GYN check 6/12 w/ labs done & she brought copies for our records> FLP=good; Chems=wnl, HgA1c=5.7, TSH=0.67, Atypia on PAP & DrTaavon is following;  They recommended Folic Acid daily...  ~  April 20, 2011:  63mo ROV & Ms.Sustaita just returned from a visit to [place] to visit relatives> she had a fabulously thorough medical screening eval at the [employer] by a group called [facility]:  LABS- all wnl x elev CRP & +UTI (not treated- we repeated C&S here- +EColi, pansens & we will treat w/ CIPRO);  +Immunity to HepB;  PAP w/ few atypic cells & she has appt for f/u w/ GYN 1/13;  AbdSonar showed fatty liver & calcif in right lobe, 1.3cm cyst on right kidney, TAH> they rec f/u Sonar ~31mo;  Thyroid Sonar showed 0.9cm nodule & 0.4cm cyst on right side w/ f/u Sonar rec~72mo;  LumbarCT showed diffuse bulging L4-5, degen changes L5-S1 & she feels that Yoga is helping her back;  BrainMRI reported normal...    HBP> on MetoprololER 50mg /d & BP was 115/73 in [place], measures 158/92 here today & we decided to continue same + no salt, etc...    PAD/ ASPVD> she is asked to resume her ASA 81mg /d; prev CTA revealed atherosclerotic changes at origin of SMA, Celiac art, IMA w/o obstruction; she remains asymptomatic w/o abd pain, N/V, difficulty when eating, etc...    ?Thyroid nodule> 9mm right lobe nodule seen on ultrasound in [place] 11/12 & we will repeat in 6 months; not palp on exam; clinically 7 biochem euthyroid...    GI> HH/ GERD/ IBS> on Nexium40; she  has had extensive work up by [employer] (see below) felt to have functional complaints & felt improved on pureed organic cabbage- now back on the Nexium...    UTI> she was said to have a lower urinary infection on her work up in [place] but they didn't treat it; repeat UA & C/S here is pos for [employer] & we will treat w/ Cipro250Bid...    Ortho> LBP, Hx left hip pain, bilat CTS> [place] eval showed diffuse bulging disc at L4-5 & she states that she is much improved w/ Yoga exercises...    ?Depression/ Nerves> she was started on some medication in [place] (Stablon 12.5mg Bid & Enafon 10mg Qhs) & we decided to try SERTALINE 25mg /d==> she never took it.  ~  October 27, 2011:  59mo ROV & [name] is improved & states that her chronic abd symptoms are alot better- prev due to cabbage juice rec by a [place] doctor, now due to diet adjust (no spicey/greasy foods)... She had VV eval by DrEarly, [facility] & he rec surgery but sh'e holding off for now...  She has remained active- plays golf, walking, & Yoga... She is due for f/u Thyroid & Abd sonar in follow up from her screening eval in [place] last yr, but she wants to hold off for now &  I feel this would be ok (no abd symptoms now & no palp thyroid abn either)...  She is overall improved & she feels that the key to everything is her diet... We reviewed prob list, meds, xrays and labs> see below>>  She required letter for [employer] application...  ~  April 23, 2012:  31mo ROV & [name] is feeling well- no new complaints or concerns... We reviewed the following medical problems during today's office visit>>     HBP> on MetoprololER 50mg /d & BP= 138/80 here today & we decided to continue same + no salt, etc...    PAD/ ASPVD> she is asked to resume her ASA 81mg /d; prev CTA revealed atherosclerotic changes at origin of SMA, Celiac art, IMA w/o obstruction; she remains asymptomatic w/o abd pain, N/V, difficulty when eating, etc...    VI, VV surg> she had bilat GSV laser  ablation from DrEarly in 2013 due to reflux seen on VenDopplers; she is pleased w/ results...    Thyroid nodule> 9mm right lobe nodule seen on ultrasound in [place] 11/12 & repeated here 9/13- no change; not palp on exam; clinically & biochem euthyroid...    GI> HH/ GERD/ IBS> on Nexium40; she has had extensive work up by [employer] (see below) felt to have functional complaints & felt improved on pureed organic cabbage- now back on the Nexium...    Fatty Liver dis, mild> Abd Sonar in [place] 2012 showed mild fatty liver dis & f/u sonar here 9/13 showed sl inhomogeneous liver texture c/w mild fatty infiltration, otherw neg- no gallstones etc...    UTI> hx UTI w/ pansens EColi & treated w/ Cipro250Bid & resolved, no recent recurrence...    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    ?Depression/ Nerves> she was started on some medication in [place] (Stablon 12.5mg Bid & Enafon 10mg Qhs) & we decided to try SERTALINE 25mg /d==> she never took it. We reviewed prob list, meds, xrays and labs> see below for updates >> she declines the Flu vaccine...          Problem List:  HYPERTENSION (ICD-401.9) - on TOPROL XL 50mg /d; & INTOL to Avalide, Micardis, Diovan she says...  ~  CXR 12/10 showed normal heart size, clear lungs, NAD.Marland Kitchen. ~  7/12: BP on monotherapy = 126/80 & similar at home... denies HA, fatigue, visual changes, CP, palipit, dizziness, syncope, dyspnea, edema, etc... ~  12/12: BP= 158/92 but was recently 115/73 in [place] & we will continue same med & monitor BP at home... ~  6/13:  BP= 150/80 after rest & she says ok at home; reminded to avoid all sodium & soy sauce etc...  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 CTAngio 6/11 showed atherosclerotic changes at origin of SMA & Celiac w/o  obstruction, mod dis at origin of IMA which is very diminutive, renal arts OK, Ao is nonaneurysmal, & otherw neg exam... ~  She remains asymptomatic w/o abd symptoms or mealtime symptoms related to this atherosclerosis...  VENOUS INSUFFICIENCY (ICD-459.81) - she follows a low sodium diet, elevates legs, & wears support hose as needed...  ~  12/12: she worries about her veins and would like to see a vein specialist... ~  4/13:  She had eval from DrEarly, [facility]- he rec surgery but she is  holding off for now...  HYPERLIPIDEMIA, borderline >> on diet management alone... 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 she has chr functional GI complaints <see above> she was very concerned about a friend who was diagnosed w/ gastric cancer- he apparently was a smoker but had no greater symptoms than she has had & she wonders about screening for early detection (see below)... She took pureed organic cabbage per [place] medicine web site for many months & felt improved, but she got sick of it & restarted NEXIUM. 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  ~  10/09 f/u w/ DrGessner- EGD showed 2cmHH, otherw neg- she was reassured. ~  12/10: she remains on Nexium doing well... 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 repeat EGD by DrGessner showed mild gastritis, neg HPylori. ~  She subseq started pureed organic cabbage per [place] medical web site & felt better; later switched back to Nexium when she got sick of the cabbage. ~  She has adjusted her diet to elim spicey & greasy food; she feels that diet is the key to everything...  IRRITABLE BOWEL SYNDROME (ICD-564.1) & HEMORRHOIDS (ICD-455.6) - last colon 2/08 w/ small int hems only...  ~  12/10: she had outpt hem surg by DrIngram... ~  CT Abd & Pelvis 3/11 showed mild atherosclerotic dis in Ao, mod  disc degen L5-S1, & bulging at L4-L5, NAD is Abd... ~  CTAngio Abd 6/11 showed atherosclerotic changes at origin of SMA & Celiac w/o obstruction, mod dis at origin of IMA which is very diminutive, renal arts OK, Ao is nonaneurysmal, & otherw neg exam...  HX, URINARY INFECTION (ICD-V13.02) - hx urinary symptoms and referred by GYN to Urology w/ cystoscopy 6/09 by DrPeterson= normal... she was tried on [employer]...  ~  Otilio Carpen lower urinary tract infections> had abn UA during work up in [place], [place] & C/S repeated here & pos for [facility] UTI (pansens) treated w/ Cipro...  Hx of LOW BACK PAIN - she had prev epid steroid shots in 1996... CT Abd 3/11 showed some DDD in lumbar area. ~  Lumbar CT in [place] 11/12 reported diffuse bulging disc at L4-5 & pt states pain is diminished w/ Yoga exercises... Left Hip Pain> she reports some left hip pain & she is doing Yoga w/ improvement... Hx bilateral CARPAL TUNNEL SYNDROME - she had prev surgery on right- 2/07, and left- 9/09 by DrSypher...  OSTEOPENIA (ICD-733.90) - she takes Calcium, MVI, Vit D... followed by GYN. ~  BMD 2010 at [facility] showed TScores -1.8 in Spine, & -1.3 in right [facility]. ~  She reports f/u BMD 7/12 at GYN office> results pending...  ?Depression/ Nerves> she was started on some medication in [place] (Stablon 12.5mg Bid & Enafon 10mg Qhs) & we decided to try SERTALINE 25mg /d...  Hx of ANEMIA-NOS (ICD-285.9) ~  labs 5/09 by GYN showed Hg= 13.5 ~  labs here 6/10 showed Hg= 13.5 ~  labs here 3/11 showed Hg= 13.5 ~  Labs 2/13 showed Hg= 14.1   Past Surgical History  Procedure Date  . Abdominal hysterectomy   . Bilateral carpal tunnel release 01/2008 and 06/2005    Dr. Teressa Senter  . Left cataract and lens 07/2008    Dr. Charlotte Sanes  . Hemorrhoid sugery 04/2009  Dr. Derrell Lolling  . Breast reduction surgery 1999  . Endovenous ablation saphenous vein w/ laser 12-15-2011    right greater saphenous vein by Gretta Began MD  . Endovenous ablation  saphenous vein w/ laser 01-26-2012    left greater saphenous vein by Gretta Began MD    Outpatient Encounter Prescriptions as of 04/23/2012  Medication Sig Dispense Refill  . Calcium-Vitamin D-Vitamin K (CALCIUM + D) [phone] MG-UNT-MCG CHEW Chew 1 tablet by mouth daily.      . Cholecalciferol (VITAMIN D) 1000 UNITS capsule Take 2,000 Units by mouth daily.       . folic acid (FOLVITE) 1 MG tablet Take 1 mg by mouth daily.        . metoprolol (TOPROL-XL) 50 MG 24 hr tablet Take 1 tablet (50 mg total) by mouth daily.  90 tablet  3  . Multiple Vitamins-Minerals (MULTIVITAMIN & MINERAL PO) Take 1 tablet by mouth daily.        Marland Kitchen NEXIUM 40 MG capsule TAKE ONE CAPSULE BY MOUTH DAILY  90 capsule  2  . [DISCONTINUED] metoprolol succinate (TOPROL-XL) 50 MG 24 hr tablet TAKE 1/2 TO 1 TABLET BY MOUTH EVERY DAY  90 tablet  0    Allergies  Allergen Reactions  . Lansoprazole     REACTION: headaches  . Penicillins     REACTION: rash  . Sulfamethoxazole W-Trimethoprim     Causes SOB and flu like symptoms    Current Medications, Allergies, Past Medical History, Past Surgical History, Family History, and Social History were reviewed in [employer] record.    Review of Systems    See HPI - all other systems neg except as noted... The patient denies anorexia, fever, weight loss, weight gain, vision loss, decreased hearing, hoarseness, chest pain, syncope, dyspnea on exertion, peripheral edema, prolonged cough, headaches, hemoptysis, abdominal pain, melena, hematochezia, severe indigestion/heartburn, hematuria, incontinence, muscle weakness, suspicious skin lesions, transient blindness, difficulty walking, depression, unusual weight change, abnormal bleeding, enlarged lymph nodes, and angioedema.    Objective:   Physical Exam     WD, WN, 61 y/o F in NAD... GENERAL:  Alert & oriented; pleasant & cooperative... HEENT:  [place]/AT, EOM-wnl,  PERRLA, EACs-wax, TMs- not vis,  NOSE-clear, THROAT-clear & wnl. NECK:  Supple w/ fairROM; no JVD; normal carotid impulses w/o bruits; no thyromegaly or nodules palpated; no lymphadenopathy. CHEST:  Clear w/o wheezing, rales, rhonchi... HEART:  Regular Rhythm; without murmurs/ rubs/ or gallops... ABDOMEN:  Soft & nontender; normal bowel sounds; no organomegaly or masses detected. EXT: without deformities or arthritic changes; no varicose veins/ +venous insuffic/ no edema. NEURO:  intact w/ no focal deficits noted DERM:  No lesions noted; no rash etc...  RADIOLOGY DATA:  Reviewed in the EPIC EMR & discussed w/ the patient...  LABORATORY DATA:  Reviewed in the EPIC EMR & discussed w/ the patient...   Assessment & Plan:    HBP>  Controlled on BBlocker, continue same...  Atherosclerotic changes in visceral arts on CTAngio in 2011>  rec to take ASA 81mg /d...  Venous Insuffic>  Aware, see eval from DrEarly 4/13- subseq bilat laser ablations of the GSV...  GI> HH, GERD, IBS, Hems>  Back on PPI rx & taking it Qod now; had colon 2008 w/ hems only...  GU> Hx UTIs, Hx voiding problems>  Prev eval by DrPeterson, Urology...  Ortho>  She is doing Yoga & improved...  Osteopenia>  BMDs & rx per GYN...  ?Depression/ Nerves> she was started on some  medication in [place] (Stablon 12.5mg Bid & Enafon 10mg Qhs) & we decided to try SERTALINE 25mg /d but she never took it.   Patient's Medications  New Prescriptions   No medications on file  Previous Medications   CALCIUM-VITAMIN D-VITAMIN K (CALCIUM + D) [phone] MG-UNT-MCG CHEW    Chew 1 tablet by mouth daily.   CHOLECALCIFEROL (VITAMIN D) 1000 UNITS CAPSULE    Take 2,000 Units by mouth daily.    FOLIC ACID (FOLVITE) 1 MG TABLET    Take 1 mg by mouth daily.     METOPROLOL (TOPROL-XL) 50 MG 24 HR TABLET    Take 1 tablet (50 mg total) by mouth daily.   MULTIPLE VITAMINS-MINERALS (MULTIVITAMIN & MINERAL PO)    Take 1 tablet by mouth daily.     NEXIUM 40 MG CAPSULE    TAKE ONE  CAPSULE BY MOUTH DAILY  Modified Medications   No medications on file  Discontinued Medications   METOPROLOL SUCCINATE (TOPROL-XL) 50 MG 24 HR TABLET    TAKE 1/2 TO 1 TABLET BY MOUTH EVERY DAY

## 2012-06-07 ENCOUNTER — Telehealth: Payer: Self-pay | Admitting: Pulmonary Disease

## 2012-06-07 MED ORDER — FAMCICLOVIR 500 MG PO TABS: 500.0000 mg | ORAL_TABLET | Freq: Two times a day (BID) | ORAL | Status: DC

## 2012-06-07 NOTE — Telephone Encounter (Signed)
I spoke with pt. She stated she has couple fever blisters on her top lip x 2 days. She is requesting to have something called in to help with this. Please advise SN thanks Last OV 04/23/12 Pending 10/24/12 Allergies  Allergen Reactions  . Lansoprazole     REACTION: headaches  . Penicillins     REACTION: rash  . Sulfamethoxazole W-Trimethoprim     Causes SOB and flu like symptoms

## 2012-06-07 NOTE — Telephone Encounter (Signed)
Pt returned call. [name]  

## 2012-06-07 NOTE — Telephone Encounter (Signed)
lmomtcb x1 for pt 

## 2012-06-07 NOTE — Telephone Encounter (Signed)
Per SN----ok to call in famvir 500 mg   #14  1 po bid.  i called and spoke with pt and she is aware that this medication has been sent to her pharmacy

## 2012-07-12 ENCOUNTER — Other Ambulatory Visit: Payer: Self-pay | Admitting: Pulmonary Disease

## 2012-07-12 MED ORDER — METOPROLOL SUCCINATE ER 50 MG PO TB24: 50.0000 mg | ORAL_TABLET | Freq: Every day | ORAL | Status: DC

## 2012-09-13 ENCOUNTER — Telehealth: Payer: Self-pay | Admitting: Pulmonary Disease

## 2012-09-13 NOTE — Telephone Encounter (Signed)
Called and spoke with pt and she is aware to discuss this with SN at her appt 6/25.  She will check with her insurance company to see if they do cover this vaccine.  Pt is aware that we do not give this in our office but can give her an rx for this vaccine.

## 2012-09-26 ENCOUNTER — Telehealth: Payer: Self-pay | Admitting: Pulmonary Disease

## 2012-09-26 MED ORDER — VARICELLA VIRUS VACCINE LIVE 1350 PFU/0.5ML IJ SUSR: 0.5000 mL | Freq: Once | INTRAMUSCULAR | Status: DC

## 2012-09-26 NOTE — Telephone Encounter (Signed)
Pt called and requested that the shingles vaccine be sent to her pharmacy and that she will go over there to get this.  rx has been sent in and nothing further is needed.

## 2012-10-24 ENCOUNTER — Ambulatory Visit (INDEPENDENT_AMBULATORY_CARE_PROVIDER_SITE_OTHER): Payer: BC Managed Care – PPO | Admitting: Pulmonary Disease

## 2012-10-24 ENCOUNTER — Other Ambulatory Visit (INDEPENDENT_AMBULATORY_CARE_PROVIDER_SITE_OTHER): Payer: BC Managed Care – PPO

## 2012-10-24 ENCOUNTER — Encounter: Payer: Self-pay | Admitting: Pulmonary Disease

## 2012-10-24 VITALS — BP 136/84 | HR 61 | Temp 98.3°F | Ht 61.5 in | Wt 150.2 lb

## 2012-10-24 DIAGNOSIS — K589 Irritable bowel syndrome without diarrhea: Secondary | ICD-10-CM

## 2012-10-24 DIAGNOSIS — E789 Disorder of lipoprotein metabolism, unspecified: Secondary | ICD-10-CM

## 2012-10-24 DIAGNOSIS — I708 Atherosclerosis of other arteries: Secondary | ICD-10-CM

## 2012-10-24 DIAGNOSIS — K219 Gastro-esophageal reflux disease without esophagitis: Secondary | ICD-10-CM

## 2012-10-24 DIAGNOSIS — I10 Essential (primary) hypertension: Secondary | ICD-10-CM

## 2012-10-24 DIAGNOSIS — K449 Diaphragmatic hernia without obstruction or gangrene: Secondary | ICD-10-CM

## 2012-10-24 DIAGNOSIS — E041 Nontoxic single thyroid nodule: Secondary | ICD-10-CM

## 2012-10-24 DIAGNOSIS — I83893 Varicose veins of bilateral lower extremities with other complications: Secondary | ICD-10-CM

## 2012-10-24 DIAGNOSIS — M899 Disorder of bone, unspecified: Secondary | ICD-10-CM

## 2012-10-24 DIAGNOSIS — I872 Venous insufficiency (chronic) (peripheral): Secondary | ICD-10-CM

## 2012-10-24 LAB — CBC WITH DIFFERENTIAL/PLATELET
Basophils Absolute: 0 10*3/uL (ref 0.0–0.1)
Eosinophils Absolute: 0.1 10*3/uL (ref 0.0–0.7)
Lymphocytes Relative: 45.3 % (ref 12.0–46.0)
MCHC: 34.2 g/dL (ref 30.0–36.0)
Monocytes Relative: 6.9 % (ref 3.0–12.0)
Neutrophils Relative %: 44.8 % (ref 43.0–77.0)
Platelets: 173 10*3/uL (ref 150.0–400.0)
RDW: 11.8 % (ref 11.5–14.6)

## 2012-10-24 LAB — LIPID PANEL
HDL: 42.6 mg/dL (ref >39.00)
Total CHOL/HDL Ratio: 4
Triglycerides: 139 mg/dL (ref 0.0–149.0)

## 2012-10-24 LAB — BASIC METABOLIC PANEL
CO2: 28 mEq/L (ref 19–32)
Chloride: 103 mEq/L (ref 96–112)
Creatinine, Ser: 0.6 mg/dL (ref 0.4–1.2)
Glucose, Bld: 86 mg/dL (ref 70–99)
Sodium: 139 mEq/L (ref 135–145)

## 2012-10-24 LAB — HEPATIC FUNCTION PANEL
ALT: 24 U/L (ref 0–35)
Albumin: 4.4 g/dL (ref 3.5–5.2)
Alkaline Phosphatase: 61 U/L (ref 39–117)
Bilirubin, Direct: 0.2 mg/dL (ref 0.0–0.3)
Total Protein: 7.4 g/dL (ref 6.0–8.3)

## 2012-10-24 LAB — TSH: TSH: 0.73 u[IU]/mL (ref 0.35–5.50)

## 2012-10-24 NOTE — Progress Notes (Signed)
Subjective:    Patient ID: [name], female    DOB: 04/10/1951, 62 y.o.   MRN: [id number]  HPI 62 y/o [place] woman here for a follow up visit... she Harrell mult medical problems as noted below...   ~  April 20, 2011:  23mo ROV & [name] just returned from a visit to [place] to visit relatives> she had a fabulously thorough medical screening eval at the [employer] by a group called [facility]:  LABS- all wnl x elev CRP & +UTI (not treated- we repeated C&S here- +EColi, pansens & we will treat w/ CIPRO);  +Immunity to HepB;  PAP w/ few atypic cells & she Harrell appt for f/u w/ GYN 1/13;  AbdSonar showed fatty liver & calcif in right lobe, 1.3cm cyst on right kidney, TAH> they rec f/u Sonar ~18mo;  Thyroid Sonar showed 0.9cm nodule & 0.4cm cyst on right side w/ f/u Sonar rec~323mo;  LumbarCT showed diffuse bulging L4-5, degen changes L5-S1 & she feels that Yoga is helping her back;  BrainMRI reported normal...    HBP> on MetoprololER 50mg /d & BP was 115/73 in [place], measures 158/92 here today & we decided to continue same + no salt, etc...    PAD/ ASPVD> she is asked to resume her ASA 81mg /d; prev CTA revealed atherosclerotic changes at origin of SMA, Celiac art, IMA w/o obstruction; she remains asymptomatic w/o abd pain, N/V, difficulty when eating, etc...    ?Thyroid nodule> 9mm right lobe nodule seen on ultrasound in [place] 11/12 & we will repeat in 6 months; not palp on exam; clinically 7 biochem euthyroid...    GI> HH/ GERD/ IBS> on Nexium40; she Harrell had extensive work up by [employer] (see below) felt to have functional complaints & felt improved on pureed organic cabbage- now back on the Nexium...    UTI> she was said to have a lower urinary infection on her work up in [place] but they didn't treat it; repeat UA & C/S here is pos for [employer] & we will treat w/ Cipro250Bid...    Ortho> LBP, Hx left hip pain, bilat CTS> [place] eval showed diffuse bulging disc at L4-5 & she states that she is  much improved w/ Yoga exercises...    ?Depression/ Nerves> she was started on some medication in [place] (Stablon 12.5mg Bid & Enafon 10mg Qhs) & we decided to try SERTALINE 25mg /d==> she never took it.  ~  October 27, 2011:  70mo ROV & [name] is improved & states that her chronic abd symptoms are alot better- prev due to cabbage juice rec by a [place] doctor, now due to diet adjust (no spicey/greasy foods)... She had VV eval by DrEarly, [facility] & he rec surgery but sh'e holding off for now...  She Harrell remained active- plays golf, walking, & Yoga... She is due for f/u Thyroid & Abd sonar in follow up from her screening eval in [place] last yr, but she wants to hold off for now & I feel this would be ok (no abd symptoms now & no palp thyroid abn either)...  She is overall improved & she feels that the key to everything is her diet... We reviewed prob list, meds, xrays and labs> see below>>  She required letter for [employer] application...  ~  April 23, 2012:  70mo ROV & [name] is feeling well- no new complaints or concerns... We reviewed the following medical problems during today's office visit>>     HBP> on MetoprololER 50mg /d & BP= 138/80  here today & we decided to continue same + no salt, etc...    PAD/ ASPVD> she is asked to resume her ASA 81mg /d; prev CTA revealed atherosclerotic changes at origin of SMA, Celiac art, IMA w/o obstruction; she remains asymptomatic w/o abd pain, N/V, difficulty when eating, etc...    VI, VV surg> she had bilat GSV laser ablation from DrEarly in 2013 due to reflux seen on VenDopplers; she is pleased w/ results...    Thyroid nodule> 9mm right lobe nodule seen on ultrasound in [place] 11/12 & repeated here 9/13- no change; not palp on exam; clinically & biochem euthyroid...    GI> HH/ GERD/ IBS> on Nexium40; she Harrell had extensive work up by [employer] (see below) felt to have functional complaints & felt improved on pureed organic cabbage- now back on the Nexium...    Fatty  Liver dis, mild> Abd Sonar in [place] 2012 showed mild fatty liver dis & f/u sonar here 9/13 showed sl inhomogeneous liver texture c/w mild fatty infiltration, otherw neg- no gallstones etc...    UTI> hx UTI w/ pansens EColi & treated w/ Cipro250Bid & resolved, no recent recurrence...    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    ?Depression/ Nerves> she was started on some medication in [place] (Stablon 12.5mg Bid & Enafon 10mg Qhs) & we decided to try SERTALINE 25mg /d==> she never took it. We reviewed prob list, meds, xrays and labs> see below for updates >> she declines the Flu vaccine...  ~  October 24, 2012:  97mo ROV & [name] had a good interval w/o new complaints or concerns;  BP well regulated on her MetopER50 w/ BP= 136/84 & she denies CP, palpit, SOB, edema, etc;  Lipids look good on diet alone;  GI stable on Nexium but c/o cost so we will try to switch to Protonix40;  No GU symptoms, prev LBP Harrell resolved, doing well overall...    We reviewed prob list, meds, xrays and labs> see below for updates >>  LABS 6/14:  FLP- at goals on diet alone;  Chems- wnl;  CBC- wnl;  TSH=0.73...           Problem List:  HYPERTENSION (ICD-401.9) - on TOPROL XL 50mg /d; & INTOL to [facility]valide, Micardis, Diovan she says...  ~  CXR 12/10 showed normal heart size, clear lungs, NAD.Marland Kitchen. ~  7/12: BP on monotherapy = 126/80 & similar at home... denies HA, fatigue, visual changes, CP, palipit, dizziness, syncope, dyspnea, edema, etc... ~  12/12: BP= 158/92 but was recently 115/73 in [place] & we will continue same med & monitor BP at home... ~  6/13:  BP= 150/80 after rest & she says ok at home; reminded to avoid all sodium & soy sauce etc... ~  12/13: on MetoprololER 50mg /d & BP= 138/80 here today & we decided to continue same + no salt,  etc...  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 CTAngio 6/11 showed atherosclerotic changes at origin of SMA & Celiac w/o obstruction, mod dis at origin of IMA which is very diminutive, renal arts OK, Ao is nonaneurysmal, & otherw neg exam... ~  She remains asymptomatic w/o abd symptoms or mealtime symptoms related to this atherosclerosis...  VENOUS INSUFFICIENCY (ICD-459.81) - she follows a low sodium diet, elevates legs, &  wears support hose as needed...  ~  12/12: she worries about her veins and would like to see a vein specialist... ~  4/13:  She had eval from DrEarly, [facility]- he rec surgery but she is holding off for now...  HYPERLIPIDEMIA, borderline >> on diet management alone... ~  FLP 6/10 showed TChol 165, TG 109, HDL 35, LDL 109 ~  FLP 6/13 showed TChol 191, TG 89, HDL 49, LDL 125 ~  FLP 6/14 on diet alone showed TChol 167, TG 139, HDL 43, LDL 97  HIATAL HERNIA (ICD-553.3) & GERD (ICD-530.81) - on NEXIUM 40mg  Qod or so... she Harrell chr functional GI complaints <see above> she was very concerned about a friend who was diagnosed w/ gastric cancer- he apparently was a smoker but had no greater symptoms than she Harrell had & she wonders about screening for early detection (see below)... She took pureed organic cabbage per [place] medicine web site for many months & felt improved, but she got sick of it & restarted NEXIUM. 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  ~  10/09 f/u w/ DrGessner- EGD showed 2cmHH, otherw neg- she was reassured. ~  12/10: she remains on Nexium doing well... 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 repeat EGD by DrGessner showed mild gastritis, neg HPylori. ~  She subseq started pureed organic cabbage per [place] medical web site & felt better; later switched back to Nexium when she got sick of the cabbage. ~  She Harrell adjusted her diet to elim spicey & greasy food; she feels that diet is the key to  everything... ~  6/14: on Nexium40 vs Prilosec20 OTC & asking for less expensive alternative- change to Protonix40...  IRRITABLE BOWEL SYNDROME (ICD-564.1) & HEMORRHOIDS (ICD-455.6) - last colon 2/08 w/ small int hems only...  ~  12/10: she had outpt hem surg by DrIngram... ~  CT Abd & Pelvis 3/11 showed mild atherosclerotic dis in Ao, mod disc degen L5-S1, & bulging at L4-L5, NAD is Abd... ~  CTAngio Abd 6/11 showed atherosclerotic changes at origin of SMA & Celiac w/o obstruction, mod dis at origin of IMA which is very diminutive, renal arts OK, Ao is nonaneurysmal, & otherw neg exam...  HX, URINARY INFECTION (ICD-V13.02) - hx urinary symptoms and referred by GYN to Urology w/ cystoscopy 6/09 by DrPeterson= normal... she was tried on [employer]...  ~  Otilio Carpen lower urinary tract infections> had abn UA during work up in [place] & C/S repeated here & pos for [facility] UTI (pansens) treated w/ Cipro...  Hx of LOW BACK PAIN - she had prev epid steroid shots in 1996... CT Abd 3/11 showed some DDD in lumbar area. ~  Lumbar CT in [place] 11/12 reported diffuse bulging disc at L4-5 & pt states pain is diminished w/ Yoga exercises... Left Hip Pain> she reports some left hip pain & she is doing Yoga w/ improvement... Hx bilateral CARPAL TUNNEL SYNDROME - she had prev surgery on right- 2/07, and left- 9/09 by DrSypher...  OSTEOPENIA (ICD-733.90) - she takes Calcium, MVI, Vit D... followed by GYN. ~  BMD 2010 at [facility] showed TScores -1.8 in Spine, & -1.3 in right [facility]. ~  She reports f/u BMD 7/12 at GYN office> results pending...  ?Depression/ Nerves> she was started on some medication in [place] (Stablon 12.5mg Bid & Enafon 10mg Qhs) & we decided to try SERTALINE 25mg /d...  Hx of ANEMIA-NOS (ICD-285.9) ~  labs 5/09 by GYN  showed Hg= 13.5 ~  labs here 6/10 showed Hg= 13.5 ~  labs here 3/11 showed Hg= 13.5 ~  Labs 2/13 showed Hg= 14.1   Past Surgical History  Procedure Laterality Date  .  Abdominal hysterectomy    . Bilateral carpal tunnel release  01/2008 and 06/2005    Dr. Teressa Senter  . Left cataract and lens  07/2008    Dr. Charlotte Sanes  . Hemorrhoid sugery  04/2009    Dr. Derrell Lolling  . Breast reduction surgery  1999  . Endovenous ablation saphenous vein w/ laser  12-15-2011    right greater saphenous vein by Gretta Began MD  . Endovenous ablation saphenous vein w/ laser  01-26-2012    left greater saphenous vein by Gretta Began MD    Outpatient Encounter Prescriptions as of 10/24/2012  Medication Sig Dispense Refill  . Calcium-Vitamin D-Vitamin K (CALCIUM + D) [phone] MG-UNT-MCG CHEW Chew 1 tablet by mouth daily.      . Cholecalciferol (VITAMIN D) 1000 UNITS capsule Take 2,000 Units by mouth daily.       . famciclovir (FAMVIR) 500 MG tablet Take 1 tablet (500 mg total) by mouth 2 (two) times daily.  14 tablet  0  . folic acid (FOLVITE) 1 MG tablet Take 1 mg by mouth daily.        . metoprolol succinate (TOPROL-XL) 50 MG 24 hr tablet Take 1 tablet (50 mg total) by mouth daily.  90 tablet  3  . Multiple Vitamins-Minerals (MULTIVITAMIN & MINERAL PO) Take 1 tablet by mouth daily.        Marland Kitchen NEXIUM 40 MG capsule TAKE ONE CAPSULE BY MOUTH DAILY  90 capsule  2  . [DISCONTINUED] varicella virus vaccine live (VARIVAX) 1350 PFU/0.5ML INJ injection Inject 0.5 mLs into the skin once.  1 each  0   No facility-administered encounter medications on file as of 10/24/2012.    Allergies  Allergen Reactions  . Lansoprazole     REACTION: headaches  . Penicillins     REACTION: rash  . Sulfamethoxazole W-Trimethoprim     Causes SOB and flu like symptoms    Current Medications, Allergies, Past Medical History, Past Surgical History, Family History, and Social History were reviewed in [employer] record.    Review of Systems    See HPI - all other systems neg except as noted... The patient denies anorexia, fever, weight loss, weight gain, vision loss, decreased hearing,  hoarseness, chest pain, syncope, dyspnea on exertion, peripheral edema, prolonged cough, headaches, hemoptysis, abdominal pain, melena, hematochezia, severe indigestion/heartburn, hematuria, incontinence, muscle weakness, suspicious skin lesions, transient blindness, difficulty walking, depression, unusual weight change, abnormal bleeding, enlarged lymph nodes, and angioedema.    Objective:   Physical Exam     WD, WN, 62 y/o F in NAD... GENERAL:  Alert & oriented; pleasant & cooperative... HEENT:  [place]/AT, EOM-wnl,  PERRLA, EACs-wax, TMs- not vis, NOSE-clear, THROAT-clear & wnl. NECK:  Supple w/ fairROM; no JVD; normal carotid impulses w/o bruits; no thyromegaly or nodules palpated; no lymphadenopathy. CHEST:  Clear w/o wheezing, rales, rhonchi... HEART:  Regular Rhythm; without murmurs/ rubs/ or gallops... ABDOMEN:  Soft & nontender; normal bowel sounds; no organomegaly or masses detected. EXT: without deformities or arthritic changes; no varicose veins/ +venous insuffic/ no edema. NEURO:  intact w/ no focal deficits noted DERM:  No lesions noted; no rash etc...  RADIOLOGY DATA:  Reviewed in the EPIC EMR & discussed w/ the patient...  LABORATORY DATA:  Reviewed in  the EPIC EMR & discussed w/ the patient...   Assessment & Plan:    HBP>  Controlled on BBlocker, continue same...  Atherosclerotic changes in visceral arts on CTAngio in 2011>  rec to take ASA 81mg /d...  Venous Insuffic>  Aware, see eval from DrEarly 4/13- subseq bilat laser ablations of the GSV...  GI> HH, GERD, IBS, Hems>  Back on PPI rx & taking it Qod now; had colon 2008 w/ hems only...  GU> Hx UTIs, Hx voiding problems>  Prev eval by DrPeterson, Urology...  Ortho>  She is doing Yoga & improved...  Osteopenia>  BMDs & rx per GYN...  ?Depression/ Nerves> she was started on some medication in [place] (Stablon 12.5mg Bid & Enafon 10mg Qhs) & we decided to try SERTALINE 25mg /d but she never took it.   Patient's  Medications  New Prescriptions   No medications on file  Previous Medications   CALCIUM-VITAMIN D-VITAMIN K (CALCIUM + D) [phone] MG-UNT-MCG CHEW    Chew 1 tablet by mouth daily.   CHOLECALCIFEROL (VITAMIN D) 1000 UNITS CAPSULE    Take 2,000 Units by mouth daily.    FAMCICLOVIR (FAMVIR) 500 MG TABLET    Take 1 tablet (500 mg total) by mouth 2 (two) times daily.   FOLIC ACID (FOLVITE) 1 MG TABLET    Take 1 mg by mouth daily.     METOPROLOL SUCCINATE (TOPROL-XL) 50 MG 24 HR TABLET    Take 1 tablet (50 mg total) by mouth daily.   MULTIPLE VITAMINS-MINERALS (MULTIVITAMIN & MINERAL PO)    Take 1 tablet by mouth daily.     NEXIUM 40 MG CAPSULE    TAKE ONE CAPSULE BY MOUTH DAILY  Modified Medications   No medications on file  Discontinued Medications   VARICELLA VIRUS VACCINE LIVE (VARIVAX) 1350 PFU/0.5ML INJ INJECTION    Inject 0.5 mLs into the skin once.

## 2012-10-24 NOTE — Patient Instructions (Addendum)
Today we updated your med list in our EPIC system...    Continue your current medications the same...  Today we did your FASTING blood work...    We will contact you w/ the results when available...   Call for any questions...  Let's plan a follow up visit in 5mo, sooner if needed for problems.Marland KitchenMarland Kitchen

## 2012-10-26 ENCOUNTER — Telehealth: Payer: Self-pay | Admitting: Pulmonary Disease

## 2012-10-26 NOTE — Telephone Encounter (Signed)
Notes Recorded by Michele Mcalpine, MD on 10/25/2012 at 12:39 PM Please notify patient>  FLP looks great & all parameters at goals on diet alone... Chems, CBC, Thyroid> ALL WNL.Brittany KitchenMarland Harrell       I spoke with patient about results and she verbalized understanding and had no questions

## 2013-01-18 ENCOUNTER — Telehealth: Payer: Self-pay | Admitting: Pulmonary Disease

## 2013-01-18 NOTE — Telephone Encounter (Signed)
Called and spoke with pt and she stated that she has been under some stress recently.  She stated that starting Tuesday she checked her BP and it was 172/89--she has had headache x 3 days and pain in the back of her neck.  She has been using tylenol.  She did take the toprol 50 mg  Bid for the last couple of days.  BP readings have been:  150/84 155/90 151/76  Pt is wanting to know if she needs another medication to help to lower her BP.  SN please advise. Thanks  Allergies  Allergen Reactions  . Lansoprazole     REACTION: headaches  . Penicillins     REACTION: rash  . Sulfamethoxazole W-Trimethoprim     Causes SOB and flu like symptoms

## 2013-01-18 NOTE — Telephone Encounter (Signed)
Per SN---  This is the best thing to do:  Increase the metoprolol  50 mg  BID and monitor BP  Try to decrease stress and relax. If she wants to take something for her nerves--ok to call in alprazolam 0.5 mg  1/2 to 1 tid prn.   Pt declined the alprazolam.  She will stay on this dose of meds and she will cont to monitor her BP and will call next week if she is not better.

## 2013-01-21 ENCOUNTER — Telehealth: Payer: Self-pay | Admitting: Pulmonary Disease

## 2013-01-21 NOTE — Telephone Encounter (Signed)
Pt advised. Jennifer Castillo, CMA  

## 2013-01-21 NOTE — Telephone Encounter (Signed)
Pt called on 01/18/13 due to elevated BP and recs were as follows: Per SN---  This is the best thing to do:  Increase the metoprolol 50 mg BID and monitor BP  Try to decrease stress and relax.  If she wants to take something for her nerves--ok to call in alprazolam 0.5 mg 1/2 to 1 tid prn.  Pt declined the alprazolam. She will stay on this dose of meds and she will cont to monitor her BP and will call next week if she is not better.   Pt states she has taking metoprolol twice daily since then and her BP today was 144/79.Pt checked BP at 2:30pm today. Pt states she has not had a headache at all today. Pt is concerned because she is leaving on Friday for a trip to [place] and wants her BP better before then. Please advise. Carron Curie, CMA Allergies  Allergen Reactions  . Lansoprazole     REACTION: headaches  . Penicillins     REACTION: rash  . Sulfamethoxazole W-Trimethoprim     Causes SOB and flu like symptoms

## 2013-01-21 NOTE — Telephone Encounter (Signed)
lmomtcb x1 for pt 

## 2013-01-21 NOTE — Telephone Encounter (Signed)
Per SN: Pt BP is acceptable (144/79) -- continue metoprolol 50mg  BID, NO salt either

## 2013-01-23 ENCOUNTER — Telehealth: Payer: Self-pay | Admitting: Pulmonary Disease

## 2013-01-23 MED ORDER — LOSARTAN POTASSIUM 100 MG PO TABS: 100.0000 mg | ORAL_TABLET | Freq: Every day | ORAL | Status: DC

## 2013-01-23 NOTE — Telephone Encounter (Signed)
Can use oct 30---any of the hold times.  thanks

## 2013-01-23 NOTE — Telephone Encounter (Signed)
Called, spoke with pt  - Pt states her BP is still elevated.  Reports last night around 9 pm it was 147/80.  This morning around 10:20 am it was 156/83 and 160/84.  Pt c/o HA, eyes hurt, neck hurst, and has dizziness at times. No blurred vision.  Pt wondering if something else is going on.  She is requesting OV today or tomorrow as she will be leaving on Friday to go to [place].  I have offered OV on tomorrow to see TP.  Pt requesting to see SN.  Dr. Ernest Mallick, pls advise if pt can be worked in one of these days.  Thank you.

## 2013-01-23 NOTE — Telephone Encounter (Signed)
Per SN---  SN feels that this is still stress related.  Pt refused the alprazolam.    recs to add:  Losartan 100 mg  #30  1 daily.     We increased the metoprolol 50 to bid and told pt to decrease her stress level.    rov with SN in 1 month on both meds.  thanks

## 2013-01-23 NOTE — Telephone Encounter (Signed)
Spoke with the pt and notified of recs per SN She verbalized understanding Rx was sent to pharm SN has nothing available in a month, and she is refusing to see TP Please advise when SN can see her thanks

## 2013-01-23 NOTE — Telephone Encounter (Signed)
lmomtcb x1 for pt to schedule appt on 02/28/13 any held spot per [facility]

## 2013-01-24 NOTE — Telephone Encounter (Signed)
appt set for 10/30 at 10am. Carron Curie, CMA

## 2013-02-11 ENCOUNTER — Telehealth: Payer: Self-pay | Admitting: Pulmonary Disease

## 2013-02-11 MED ORDER — METHYLPREDNISOLONE (PAK) 4 MG PO TABS: ORAL_TABLET | ORAL | Status: DC

## 2013-02-11 NOTE — Telephone Encounter (Signed)
I spoke with pt. She stated she just got back from [place]. She reports she thinks she has sun poisoning. She reports she used sun block. It seems to be worse on her back and side. She has been using otc Claritin and OTC cream to help with this but not helping. She thinks she may need prednisone. Please advise SN thanks Last OV 10/24/12 Pending 02/28/13 Allergies  Allergen Reactions  . Lansoprazole     REACTION: headaches  . Penicillins     REACTION: rash  . Sulfamethoxazole-Trimethoprim     Causes SOB and flu like symptoms

## 2013-02-11 NOTE — Telephone Encounter (Signed)
Spoke with pt and advised of Dr Jodelle Green recommendations.  RX sent

## 2013-02-28 ENCOUNTER — Ambulatory Visit (INDEPENDENT_AMBULATORY_CARE_PROVIDER_SITE_OTHER): Payer: BC Managed Care – PPO | Admitting: Pulmonary Disease

## 2013-02-28 ENCOUNTER — Encounter: Payer: Self-pay | Admitting: Pulmonary Disease

## 2013-02-28 VITALS — BP 134/72 | HR 67 | Temp 98.3°F | Ht 61.5 in | Wt 151.8 lb

## 2013-02-28 DIAGNOSIS — K219 Gastro-esophageal reflux disease without esophagitis: Secondary | ICD-10-CM

## 2013-02-28 DIAGNOSIS — E041 Nontoxic single thyroid nodule: Secondary | ICD-10-CM

## 2013-02-28 DIAGNOSIS — E789 Disorder of lipoprotein metabolism, unspecified: Secondary | ICD-10-CM

## 2013-02-28 DIAGNOSIS — I872 Venous insufficiency (chronic) (peripheral): Secondary | ICD-10-CM

## 2013-02-28 DIAGNOSIS — I10 Essential (primary) hypertension: Secondary | ICD-10-CM

## 2013-02-28 NOTE — Patient Instructions (Signed)
Today we updated your med list in our EPIC system...    Continue your current medications the same...  We wrote new prescriptions for the Metoprolol 50mg  one tab twice daily (try the less expensive Metop tartrate)...    And the Losartan 100mg /d...   We will arrange for a follow up thyroid ultrasound> we will call you w/ the results when avail...  Call for any questions...  Let's plan a follow up visit in 69mo, sooner if needed for problems.Marland KitchenMarland Kitchen

## 2013-02-28 NOTE — Progress Notes (Signed)
Subjective:    Patient ID: [name], female    DOB: February 18, 1951, 62 y.o.   MRN: [id number]  HPI 62 y/o Asian woman here for a follow up visit... she has mult medical problems as noted below...   ~  October 27, 2011:  48mo ROV & [name] is improved & states that her chronic abd symptoms are alot better- prev due to cabbage juice rec by a [place] doctor, now due to diet adjust (no spicey/greasy foods)... She had VV eval by DrEarly, [facility] & he rec surgery but sh'e holding off for now...  She has remained active- plays golf, walking, & Yoga... She is due for f/u Thyroid & Abd sonar in follow up from her screening eval in [place] last yr, but she wants to hold off for now & I feel this would be ok (no abd symptoms now & no palp thyroid abn either)...  She is overall improved & she feels that the key to everything is her diet... We reviewed prob list, meds, xrays and labs> see below>>  She required letter for [employer] application...  ~  April 23, 2012:  48mo ROV & [name] is feeling well- no new complaints or concerns... We reviewed the following medical problems during today's office visit>>     HBP> on MetoprololER 50mg /d & BP= 138/80 here today & we decided to continue same + no salt, etc...    PAD/ ASPVD> she is asked to resume her ASA 81mg /d; prev CTA revealed atherosclerotic changes at origin of SMA, Celiac art, IMA w/o obstruction; she remains asymptomatic w/o abd pain, N/V, difficulty when eating, etc...    VI, VV surg> she had bilat GSV laser ablation from DrEarly in 2013 due to reflux seen on VenDopplers; she is pleased w/ results...    Thyroid nodule> 9mm right lobe nodule seen on ultrasound in [place] 11/12 & repeated here 9/13- no change; not palp on exam; clinically & biochem euthyroid...    GI> HH/ GERD/ IBS> on Nexium40; she has had extensive work up by [employer] (see below) felt to have functional complaints & felt improved on pureed organic cabbage- now back on the Nexium...    Fatty  Liver dis, mild> Abd Sonar in [place] 2012 showed mild fatty liver dis & f/u sonar here 9/13 showed sl inhomogeneous liver texture c/w mild fatty infiltration, otherw neg- no gallstones etc...    UTI> hx UTI w/ pansens EColi & treated w/ Cipro250Bid & resolved, no recent recurrence...    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    ?Depression/ Nerves> she was started on some medication in [place] (Stablon 12.5mg Bid & Enafon 10mg Qhs) & we decided to try SERTALINE 25mg /d==> she never took it. We reviewed prob list, meds, xrays and labs> see below for updates >> she declines the Flu vaccine...  ~  October 24, 2012:  48mo ROV & [name] has had a good interval w/o new complaints or concerns;  BP well regulated on her MetopER50 w/ BP= 136/84 & she denies CP, palpit, SOB, edema, etc;  Lipids look good on diet alone;  GI stable on Nexium but c/o cost so we will try to switch to Protonix40;  No GU symptoms, prev LBP has resolved, doing well overall...    We reviewed prob list, meds,  xrays and labs> see below for updates >>  LABS 6/14:  FLP- at goals on diet alone;  Chems- wnl;  CBC- wnl;  TSH=0.73...  ~  February 28, 2013:  64mo ROV & [name] describes a good interval- BP has been good at home130's/ 70's on ave;  We reviewed the following medical problems during today's office visit >>     HBP> on MetoprololER 50mg Bid, Losartan100; BP= 134/72 here today; same meds + no salt, etc...    PAD/ ASPVD> she is asked to resume her ASA 81mg /d; prev CTA revealed atherosclerotic changes at origin of SMA, Celiac art, IMA w/o obstruction; she remains asymptomatic w/o abd pain, N/V, difficulty when eating, etc...    VI, VV surg> she had bilat GSV laser ablation from DrEarly in 2013 due to reflux seen on VenDopplers; she is pleased w/ results...     Thyroid nodule> 9mm right lobe nodule seen on ultrasound in [place] 11/12 & repeated here 9/13- no change; not palp on exam; clinically & biochem euthyroid; 25yr f/u Ultrasound due...    GI> HH/ GERD/ IBS> on Nexium40; she has had extensive work up by [employer] (see below) felt to have functional complaints & she felt improved on pureed organic cabbage (per [place] doctor)- now back on the PPI...    Fatty Liver dis, mild> Abd Sonar in [place] 2012 showed mild fatty liver dis & f/u sonar here 9/13 showed sl inhomogeneous liver texture c/w mild fatty infiltration, otherw neg- no gallstones etc & LFTs remain WNL...    UTI> hx UTI w/ pansens EColi & treated w/ Cipro250Bid & resolved, no recent recurrence...    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    ?Depression/ Nerves> she was started on some medication in [place] (Stablon 12.5mg Bid & Enafon 10mg Qhs) & we decided to try SERTALINE 25mg /d==> she never took it & denies symptoms. We reviewed prob list, meds, xrays and labs> see below for updates >> she had the 2014 flu vaccine in sept...  Thyroid ultrasound 11/14 => pending           Problem List:  HYPERTENSION (ICD-401.9) - on TOPROL XL 50mg /d; & INTOL to [facility], Micardis, Diovan she says...  ~  CXR 12/10 showed normal heart size, clear lungs, NAD.Marland Kitchen. ~  7/12: BP on monotherapy = 126/80 & similar at home... denies HA, fatigue, visual changes, CP, palipit, dizziness, syncope, dyspnea, edema, etc... ~  12/12: BP= 158/92 but was recently 115/73 in [place] & we will continue same med & monitor BP at home... ~  6/13:  BP= 150/80 after rest & she says ok at home; reminded to avoid all sodium & soy sauce etc... ~  12/13: on MetoprololER 50mg /d & BP= 138/80 here today & we decided to continue same + no  salt, etc...  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 CTAngio 6/11 showed atherosclerotic changes at origin of SMA & Celiac w/o obstruction, mod dis at origin of IMA which is very diminutive, renal arts OK, Ao is nonaneurysmal, & otherw neg exam... ~  She remains asymptomatic w/o abd symptoms or mealtime symptoms related to this atherosclerosis...  VENOUS INSUFFICIENCY (ICD-459.81) - she follows a low sodium diet, elevates legs, & wears support hose as needed...  ~  12/12:  she worries about her veins and would like to see a vein specialist... ~  4/13:  She had eval from DrEarly, [facility]- he rec surgery but she is holding off for now...  HYPERLIPIDEMIA, borderline >> on diet management alone... ~  FLP 6/10 showed TChol 165, TG 109, HDL 35, LDL 109 ~  FLP 6/13 showed TChol 191, TG 89, HDL 49, LDL 125 ~  FLP 6/14 on diet alone showed TChol 167, TG 139, HDL 43, LDL 97  HIATAL HERNIA (ICD-553.3) & GERD (ICD-530.81) - on NEXIUM 40mg  Qod or so... she has chr functional GI complaints <see above> she was very concerned about a friend who was diagnosed w/ gastric cancer- he apparently was a smoker but had no greater symptoms than she has had & she wonders about screening for early detection (see below)... She took pureed organic cabbage per [place] medicine web site for many months & felt improved, but she got sick of it & restarted NEXIUM. 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  ~  10/09 f/u w/ DrGessner- EGD showed 2cmHH, otherw neg- she was reassured. ~  12/10: she remains on Nexium doing well... 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 repeat EGD by DrGessner showed mild gastritis, neg HPylori. ~  She subseq started pureed organic cabbage per [place] medical web site & felt better; later switched back to Nexium when she got sick of the cabbage. ~  She has adjusted her diet to elim spicey & greasy food; she feels that diet is the key to  everything... ~  6/14: on Nexium40 vs Prilosec20 OTC & asking for less expensive alternative- change to Protonix40...  IRRITABLE BOWEL SYNDROME (ICD-564.1) & HEMORRHOIDS (ICD-455.6) - last colon 2/08 w/ small int hems only...  ~  12/10: she had outpt hem surg by DrIngram... ~  CT Abd & Pelvis 3/11 showed mild atherosclerotic dis in Ao, mod disc degen L5-S1, & bulging at L4-L5, NAD is Abd... ~  CTAngio Abd 6/11 showed atherosclerotic changes at origin of SMA & Celiac w/o obstruction, mod dis at origin of IMA which is very diminutive, renal arts OK, Ao is nonaneurysmal, & otherw neg exam...  HX, URINARY INFECTION (ICD-V13.02) - hx urinary symptoms and referred by GYN to Urology w/ cystoscopy 6/09 by DrPeterson= normal... she was tried on [employer]...  ~  Otilio Carpen lower urinary tract infections> had abn UA during work up in [place], [place] & C/S repeated here & pos for [facility] UTI (pansens) treated w/ Cipro...  Hx of LOW BACK PAIN - she had prev epid steroid shots in 1996... CT Abd 3/11 showed some DDD in lumbar area. ~  Lumbar CT in [place] 11/12 reported diffuse bulging disc at L4-5 & pt states pain is diminished w/ Yoga exercises... Left Hip Pain> she reports some left hip pain & she is doing Yoga w/ improvement... Hx bilateral CARPAL TUNNEL SYNDROME - she had prev surgery on right- 2/07, and left- 9/09 by DrSypher...  OSTEOPENIA (ICD-733.90) - she takes Calcium, MVI, Vit D... followed by GYN. ~  BMD 2010 at [facility] showed TScores -1.8 in Spine, & -1.3 in right [facility]. ~  She reports f/u BMD 7/12 at GYN office> results pending...  ?Depression/ Nerves> she was started on some medication in [place] (Stablon 12.5mg Bid & Enafon 10mg Qhs) & we decided to try SERTALINE 25mg /d...  Hx of ANEMIA-NOS (ICD-285.9) ~  labs 5/09 by GYN showed Hg= 13.5 ~  labs here 6/10 showed  Hg= 13.5 ~  labs here 3/11 showed Hg= 13.5 ~  Labs 2/13 showed Hg= 14.1  HEALTH MAINTENANCE >>  ~  GI:  Followed by drGessner; Colon  2008 w/ hem's only- surg DrHIngram... ~  GYN:  Followed by DrFogelman; Hx VIN; had neg mammogram 11/13... ~  Immuniz:  Gets the yearly seasonal Flu vaccine each fall;  ?when last tetanus shot was give...   Past Surgical History  Procedure Laterality Date  . Abdominal hysterectomy    . Bilateral carpal tunnel release  01/2008 and 06/2005    Dr. Teressa Senter  . Left cataract and lens  07/2008    Dr. Charlotte Sanes  . Hemorrhoid sugery  04/2009    Dr. Derrell Lolling  . Breast reduction surgery  1999  . Endovenous ablation saphenous vein w/ laser  12-15-2011    right greater saphenous vein by Gretta Began MD  . Endovenous ablation saphenous vein w/ laser  01-26-2012    left greater saphenous vein by Gretta Began MD    Outpatient Encounter Prescriptions as of 02/28/2013  Medication Sig Dispense Refill  . Calcium-Vitamin D-Vitamin K (CALCIUM + D) [phone] MG-UNT-MCG CHEW Chew 1 tablet by mouth daily.      . Cholecalciferol (VITAMIN D) 1000 UNITS capsule Take 2,000 Units by mouth daily.       . folic acid (FOLVITE) 1 MG tablet Take 1 mg by mouth daily.        Marland Kitchen losartan (COZAAR) 100 MG tablet Take 1 tablet (100 mg total) by mouth daily.  30 tablet  1  . metoprolol succinate (TOPROL-XL) 50 MG 24 hr tablet Take 1 tablet (50 mg total) by mouth daily.  90 tablet  3  . Multiple Vitamins-Minerals (MULTIVITAMIN & MINERAL PO) Take 1 tablet by mouth daily.        Marland Kitchen NEXIUM 40 MG capsule TAKE ONE CAPSULE BY MOUTH DAILY  90 capsule  2  . [DISCONTINUED] famciclovir (FAMVIR) 500 MG tablet Take 1 tablet (500 mg total) by mouth 2 (two) times daily.  14 tablet  0  . [DISCONTINUED] methylPREDNIsolone (MEDROL DOSPACK) 4 MG tablet follow package directions  21 tablet  0   No facility-administered encounter medications on file as of 02/28/2013.    Allergies  Allergen Reactions  . Lansoprazole     REACTION: headaches  . Penicillins     REACTION: rash  . Sulfamethoxazole-Trimethoprim     Causes SOB and flu like symptoms     Current Medications, Allergies, Past Medical History, Past Surgical History, Family History, and Social History were reviewed in [employer] record.    Review of Systems    See HPI - all other systems neg except as noted... The patient denies anorexia, fever, weight loss, weight gain, vision loss, decreased hearing, hoarseness, chest pain, syncope, dyspnea on exertion, peripheral edema, prolonged cough, headaches, hemoptysis, abdominal pain, melena, hematochezia, severe indigestion/heartburn, hematuria, incontinence, muscle weakness, suspicious skin lesions, transient blindness, difficulty walking, depression, unusual weight change, abnormal bleeding, enlarged lymph nodes, and angioedema.    Objective:   Physical Exam     WD, WN, 62 y/o F in NAD... GENERAL:  Alert & oriented; pleasant & cooperative... HEENT:  [place]/AT, EOM-wnl,  PERRLA, EACs-wax, TMs- not vis, NOSE-clear, THROAT-clear & wnl. NECK:  Supple w/ fairROM; no JVD; normal carotid impulses w/o bruits; no thyromegaly or nodules palpated; no lymphadenopathy. CHEST:  Clear w/o wheezing, rales, rhonchi... HEART:  Regular Rhythm; without murmurs/ rubs/ or gallops... ABDOMEN:  Soft & nontender; normal  bowel sounds; no organomegaly or masses detected. EXT: without deformities or arthritic changes; no varicose veins/ +venous insuffic/ no edema. NEURO:  intact w/ no focal deficits noted DERM:  No lesions noted; no rash etc...  RADIOLOGY DATA:  Reviewed in the EPIC EMR & discussed w/ the patient...  LABORATORY DATA:  Reviewed in the EPIC EMR & discussed w/ the patient...   Assessment & Plan:    HBP>  Controlled on BBlocker, continue same...  Atherosclerotic changes in visceral arts on CTAngio in 2011>  rec to take ASA 81mg /d...  Venous Insuffic>  Aware, see eval from DrEarly 4/13- subseq bilat laser ablations of the GSV...  Thyroid nodule>  Found on sonar done routinely in [place] extensive medical eval;  f/u her 9/13 & repeat due now 10/14...  GI> HH, GERD, IBS, Hems>  Back on PPI rx & taking it Qod now; had colon 2008 w/ hems only...  GU> Hx UTIs, Hx voiding problems>  Prev eval by DrPeterson, Urology...  Ortho>  She is doing Yoga & improved...  Osteopenia>  BMDs & rx per GYN...  ?Depression/ Nerves> she was started on some medication in [place] (Stablon 12.5mg Bid & Enafon 10mg Qhs) & we decided to try SERTALINE 25mg /d but she never took it.   Patient's Medications  New Prescriptions   No medications on file  Previous Medications   CALCIUM-VITAMIN D-VITAMIN K (CALCIUM + D) [phone] MG-UNT-MCG CHEW    Chew 1 tablet by mouth daily.   CHOLECALCIFEROL (VITAMIN D) 1000 UNITS CAPSULE    Take 2,000 Units by mouth daily.    FOLIC ACID (FOLVITE) 1 MG TABLET    Take 1 mg by mouth daily.     LOSARTAN (COZAAR) 100 MG TABLET    Take 1 tablet (100 mg total) by mouth daily.   MULTIPLE VITAMINS-MINERALS (MULTIVITAMIN & MINERAL PO)    Take 1 tablet by mouth daily.     NEXIUM 40 MG CAPSULE    TAKE ONE CAPSULE BY MOUTH DAILY  Modified Medications   No medications on file  Discontinued Medications   FAMCICLOVIR (FAMVIR) 500 MG TABLET    Take 1 tablet (500 mg total) by mouth 2 (two) times daily.   METHYLPREDNISOLONE (MEDROL DOSPACK) 4 MG TABLET    follow package directions   METOPROLOL SUCCINATE (TOPROL-XL) 50 MG 24 HR TABLET    Take 1 tablet (50 mg total) by mouth daily.

## 2013-03-05 ENCOUNTER — Telehealth: Payer: Self-pay | Admitting: Pulmonary Disease

## 2013-03-05 MED ORDER — METOPROLOL TARTRATE 50 MG PO TABS: 50.0000 mg | ORAL_TABLET | Freq: Two times a day (BID) | ORAL | Status: DC

## 2013-03-05 NOTE — Telephone Encounter (Signed)
We wrote new prescriptions for the Metoprolol 50mg  one tab twice daily (try the less expensive Metop tartrate)...    And the Losartan 100mg /d...  ---  I spoke with pt. She reports RX was not sent. I looked in epic and did not see this sent. I have done so. Nothing further needed

## 2013-03-06 ENCOUNTER — Telehealth: Payer: Self-pay | Admitting: Pulmonary Disease

## 2013-03-06 NOTE — Telephone Encounter (Signed)
Rx was sent so I called the pharmacy and they had placed it on hold so I advised this is a new rx with new instructions so they need to fill it now. They have it ready and pt is aware. Carron Curie, CMA

## 2013-03-11 ENCOUNTER — Ambulatory Visit
Admission: RE | Admit: 2013-03-11 | Discharge: 2013-03-11 | Disposition: A | Discharge status: Home / Self Care | Payer: BC Managed Care – PPO | Source: Ambulatory Visit | Attending: Pulmonary Disease | Admitting: Pulmonary Disease

## 2013-03-11 DIAGNOSIS — E041 Nontoxic single thyroid nodule: Secondary | ICD-10-CM

## 2013-03-12 ENCOUNTER — Telehealth: Payer: Self-pay | Admitting: Pulmonary Disease

## 2013-03-12 NOTE — Telephone Encounter (Signed)
Pt informed of Thyroid ultrasound results per Dr Kriste Basque

## 2013-03-16 ENCOUNTER — Other Ambulatory Visit: Payer: Self-pay | Admitting: Pulmonary Disease

## 2013-04-05 ENCOUNTER — Encounter: Payer: Self-pay | Admitting: Pulmonary Disease

## 2013-05-15 ENCOUNTER — Ambulatory Visit: Payer: BC Managed Care – PPO | Admitting: Pulmonary Disease

## 2013-06-28 ENCOUNTER — Other Ambulatory Visit: Payer: Self-pay | Admitting: Pulmonary Disease

## 2013-07-04 ENCOUNTER — Telehealth: Payer: Self-pay | Admitting: Pulmonary Disease

## 2013-07-04 MED ORDER — METOPROLOL TARTRATE 50 MG PO TABS: 50.0000 mg | ORAL_TABLET | Freq: Two times a day (BID) | ORAL | Status: DC

## 2013-07-04 NOTE — Telephone Encounter (Signed)
I called the pt and let her know her we will send in 90 day supply of her medication. Nothing further is needed.

## 2013-07-11 ENCOUNTER — Other Ambulatory Visit: Payer: Self-pay | Admitting: Pulmonary Disease

## 2013-07-11 DIAGNOSIS — I10 Essential (primary) hypertension: Secondary | ICD-10-CM

## 2013-07-11 DIAGNOSIS — K449 Diaphragmatic hernia without obstruction or gangrene: Secondary | ICD-10-CM

## 2013-08-07 ENCOUNTER — Ambulatory Visit (INDEPENDENT_AMBULATORY_CARE_PROVIDER_SITE_OTHER): Payer: BC Managed Care – PPO | Admitting: Emergency Medicine

## 2013-08-07 VITALS — BP 130/70 | HR 89 | Temp 97.9°F | Ht 62.0 in | Wt 152.2 lb

## 2013-08-07 DIAGNOSIS — E860 Dehydration: Secondary | ICD-10-CM

## 2013-08-07 DIAGNOSIS — A088 Other specified intestinal infections: Secondary | ICD-10-CM

## 2013-08-07 LAB — POCT CBC
Granulocyte percent: 78.5 %G (ref 37–80)
HCT, POC: 36.1 % — AB (ref 37.7–47.9)
Hemoglobin: 11.7 g/dL — AB (ref 12.2–16.2)
LYMPH, POC: 0.6 (ref 0.6–3.4)
MCH: 30.5 pg (ref 27–31.2)
MCHC: 32.4 g/dL (ref 31.8–35.4)
MCV: 94.1 fL (ref 80–97)
MID (CBC): 0.3 (ref 0–0.9)
MPV: 8.5 fL (ref 0–99.8)
PLATELET COUNT, POC: 145 10*3/uL (ref 142–424)
POC Granulocyte: 3.1 (ref 2–6.9)
POC LYMPH PERCENT: 15.1 %L (ref 10–50)
POC MID %: 6.4 % (ref 0–12)
RBC: 3.84 M/uL — AB (ref 4.04–5.48)
RDW, POC: 12.3 %
WBC: 4 10*3/uL — AB (ref 4.6–10.2)

## 2013-08-07 MED ORDER — ONDANSETRON 8 MG PO TBDP: 8.0000 mg | ORAL_TABLET | Freq: Three times a day (TID) | ORAL | Status: DC | PRN

## 2013-08-07 MED ORDER — LOPERAMIDE HCL 2 MG PO TABS: ORAL_TABLET | ORAL | Status: DC

## 2013-08-07 MED ORDER — ONDANSETRON 4 MG PO TBDP: 8.0000 mg | ORAL_TABLET | Freq: Once | ORAL | Status: DC

## 2013-08-07 NOTE — Progress Notes (Signed)
[facility] [address], [place] [address] [phone]  Date:  08/07/2013   Name:  [name]   DOB:  [birth year]   MRN:  [id number]  PCP:  Noralee Space, MD    Chief Complaint: Abdominal Cramping, Nausea and Headache   History of Present Illness:  [name] is a 63 y.o. very pleasant female patient who presents with the following:  Ill since last night after supper, she developed nausea and vomiting.  No fever or chills.  Poor po intake all day today dur to persistent vomiting.  The patient has no complaint of blood, mucous, or pus in her stools. Thinks there is something in her stomach, stuck.  No improvement with over the counter medications or other home remedies. Denies other complaint or health concern today.   Patient Active Problem List   Diagnosis Date Noted  . Borderline high cholesterol 04/23/2012  . Thyroid nodule 10/27/2011  . Varicose veins of lower extremities with other complications 10/23/7626  . LBP (low back pain) 04/20/2011  . ATHEROSCLEROSIS OF OTHER SPECIFIED ARTERIES 10/26/2009  . ABDOMINAL PAIN, UPPER 10/08/2009  . OSTEOPENIA 04/14/2009  . DYSPEPSIA, CHRONIC 01/09/2008  . HEMORRHOIDS 04/24/2007  . IRRITABLE BOWEL SYNDROME 04/24/2007  . ANEMIA-NOS 02/12/2007  . HYPERTENSION 02/12/2007  . VENOUS INSUFFICIENCY 02/12/2007  . GERD 02/12/2007  . HIATAL HERNIA 02/12/2007  . HX, URINARY INFECTION 02/12/2007    Past Medical History  Diagnosis Date  . Hypertension   . Atherosclerosis of other specified arteries   . Venous insufficiency   . Hiatal hernia   . GERD (gastroesophageal reflux disease)   . IBS (irritable bowel syndrome)   . Hemorrhoids   . UTI (lower urinary tract infection)   . Osteopenia   . Low back pain   . Anemia   . Bilateral carpal tunnel syndrome     Past Surgical History  Procedure Laterality Date  . Abdominal hysterectomy    . Bilateral carpal tunnel release  01/2008 and 06/2005    Dr. Daylene Katayama  .  Left cataract and lens  07/2008    Dr. Ellie Lunch  . Hemorrhoid sugery  04/2009    Dr. Dalbert Batman  . Breast reduction surgery  1999  . Endovenous ablation saphenous vein w/ laser  12-15-2011    right greater saphenous vein by Curt Jews MD  . Endovenous ablation saphenous vein w/ laser  01-26-2012    left greater saphenous vein by Curt Jews MD    History  Substance Use Topics  . Smoking status: Never Smoker   . Smokeless tobacco: Never Used  . Alcohol Use: No    Family History  Problem Relation Age of Onset  . Stroke Mother   . COPD Father     Allergies  Allergen Reactions  . Lansoprazole     REACTION: headaches  . Penicillins     REACTION: rash  . Sulfamethoxazole-Trimethoprim     Causes SOB and flu like symptoms    Medication list has been reviewed and updated.  Current Outpatient Prescriptions on File Prior to Visit  Medication Sig Dispense Refill  . Calcium-Vitamin D-Vitamin K (CALCIUM + D) [phone] MG-UNT-MCG CHEW Chew 1 tablet by mouth daily.      . Cholecalciferol (VITAMIN D) 1000 UNITS capsule Take 2,000 Units by mouth daily.       . folic acid (FOLVITE) 1 MG tablet Take 1 mg by mouth daily.        Marland Kitchen losartan (COZAAR)  100 MG tablet TAKE 1 TABLET BY MOUTH DAILY  30 tablet  6  . metoprolol (LOPRESSOR) 50 MG tablet Take 1 tablet (50 mg total) by mouth 2 (two) times daily.  180 tablet  1  . Multiple Vitamins-Minerals (MULTIVITAMIN & MINERAL PO) Take 1 tablet by mouth daily.        Marland Kitchen NEXIUM 40 MG capsule TAKE ONE CAPSULE BY MOUTH DAILY  90 capsule  2   No current facility-administered medications on file prior to visit.    Review of Systems:  As per HPI, otherwise negative.    Physical Examination: Filed Vitals:   08/07/13 2032  BP: 130/70  Pulse: 89  Temp: 97.9 F (36.6 C)   Filed Vitals:   08/07/13 2032  Height: 5\' 2"  (1.575 m)  Weight: 152 lb 4 oz (69.06 kg)   Body mass index is 27.84 kg/(m^2). Ideal Body Weight: Weight in (lb) to have BMI = 25:  136.4  GEN: WDWN, NAD, Non-toxic, A & O x 3  dry HEENT: Atraumatic, Normocephalic. Neck supple. No masses, No LAD. Ears and Nose: No external deformity. CV: RRR, No M/G/R. No JVD. No thrill. No extra heart sounds. PULM: CTA B, no wheezes, crackles, rhonchi. No retractions. No resp. distress. No accessory muscle use. ABD: S, NT, ND, +BS. No rebound. No HSM. EXTR: No c/c/e NEURO Normal gait.  PSYCH: Normally interactive. Conversant. Not depressed or anxious appearing.  Calm demeanor.    Assessment and Plan: Dehydration Gastroenteritis Fluids zofran Imodium  Signed,  Ellison Carwin, MD   Results for orders placed in visit on 08/07/13  POCT CBC      Result Value Ref Range   WBC 4.0 (*) 4.6 - 10.2 K/uL   Lymph, poc 0.6  0.6 - 3.4   POC LYMPH PERCENT 15.1  10 - 50 %L   MID (cbc) 0.3  0 - 0.9   POC MID % 6.4  0 - 12 %M   POC Granulocyte 3.1  2 - 6.9   Granulocyte percent 78.5  37 - 80 %G   RBC 3.84 (*) 4.04 - 5.48 M/uL   Hemoglobin 11.7 (*) 12.2 - 16.2 g/dL   HCT, POC 36.1 (*) 37.7 - 47.9 %   MCV 94.1  80 - 97 fL   MCH, POC 30.5  27 - 31.2 pg   MCHC 32.4  31.8 - 35.4 g/dL   RDW, POC 12.3     Platelet Count, POC 145  142 - 424 K/uL   MPV 8.5  0 - 99.8 fL

## 2013-08-07 NOTE — Patient Instructions (Signed)
Diet The clear liquid diet consists of foods that are liquid or will become liquid at room temperature. Examples of foods allowed on a clear liquid diet include fruit juice, broth or bouillon, gelatin, or frozen ice pops. You should be able to see through the liquid. The purpose of this diet is to provide the necessary fluids, electrolytes (such as sodium and potassium), and energy to keep the body functioning during times when you are not able to consume a regular diet. A clear liquid diet should not be continued for long periods of time, as it is not nutritionally adequate.  A CLEAR LIQUID DIET MAY BE NEEDED:  When a sudden-onset (acute) condition occurs before or after surgery.   As the first step in oral feeding.   For fluid and electrolyte replacement in diarrheal diseases.   As a diet before certain medical tests are performed.  ADEQUACY The clear liquid diet is adequate only in ascorbic acid, according to the Recommended Dietary Allowances of the National Research Council.  CHOOSING FOODS Breads and Starches  Allowed: None are allowed.   Avoid: All are to be avoided.  Vegetables  Allowed: Strained vegetable juices.   Avoid: Any others.  Fruit  Allowed: Strained fruit juices and fruit drinks. Include 1 serving of citrus or vitamin C-enriched fruit juice daily.   Avoid: Any others.  Meat and Meat Substitutes  Allowed: None are allowed.   Avoid: All are to be avoided.  Milk Products  Allowed: None are allowed.   Avoid: All are to be avoided.  Soups and Combination Foods  Allowed: Clear bouillon, broth, or strained broth-based soups.   Avoid: Any others.  Desserts and Sweets  Allowed: Sugar, honey. High-protein gelatin. Flavored gelatin, ices, or frozen ice pops that do not contain milk.   Avoid: Any others.  Fats and Oils  Allowed: None are allowed.   Avoid: All are to be avoided.  Beverages  Allowed: Cereal  beverages, coffee (regular or decaffeinated), tea, or soda at the discretion of your health care provider.   Avoid: Any others.  Condiments  Allowed: Salt.   Avoid: Any others, including pepper.  Supplements  Allowed: Liquid nutrition beverages that you can see through.   Avoid: Any others that contain lactose or fiber. SAMPLE MEAL PLAN Breakfast  4 oz (120 mL) strained orange juice.   to 1 cup (120 to 240 mL) gelatin (plain or fortified).  1 cup (240 mL) beverage (coffee or tea).  Sugar, if desired. Midmorning Snack   cup (120 mL) gelatin (plain or fortified). Lunch  1 cup (240 mL) broth or consomm.  4 oz (120 mL) strained grapefruit juice.   cup (120 mL) gelatin (plain or fortified).  1 cup (240 mL) beverage (coffee or tea).  Sugar, if desired. Midafternoon Snack   cup (120 mL) fruit ice.   cup (120 mL) strained fruit juice. Dinner  1 cup (240 mL) broth or consomm.   cup (120 mL) cranberry juice.   cup (120 mL) flavored gelatin (plain or fortified).  1 cup (240 mL) beverage (coffee or tea).  Sugar, if desired. Evening Snack  4 oz (120 mL) strained apple juice (vitamin C-fortified).   cup (120 mL) flavored gelatin (plain or fortified). MAKE SURE YOU:  Understand these instructions.  Will watch your child's condition.  Will get help right away if your child is not doing well or gets worse. Document Released: 04/18/2005 Document Revised: 12/19/2012 Document Reviewed: 09/18/2012 ExitCare Patient Information 2014 [employer]. Viral   Gastroenteritis Viral gastroenteritis is also known as stomach flu. This condition affects the stomach and intestinal tract. It can cause sudden diarrhea and vomiting. The illness typically lasts 3 to 8 days. Most people develop an immune response that eventually gets rid of the virus. While this natural response develops, the virus can make you quite ill. CAUSES  Many different viruses can cause  gastroenteritis, such as rotavirus or noroviruses. You can catch one of these viruses by consuming contaminated food or water. You may also catch a virus by sharing utensils or other personal items with an infected person or by touching a contaminated surface. SYMPTOMS  The most common symptoms are diarrhea and vomiting. These problems can cause a severe loss of body fluids (dehydration) and a body salt (electrolyte) imbalance. Other symptoms may include:  Fever.  Headache.  Fatigue.  Abdominal pain. DIAGNOSIS  Your caregiver can usually diagnose viral gastroenteritis based on your symptoms and a physical exam. A stool sample may also be taken to test for the presence of viruses or other infections. TREATMENT  This illness typically goes away on its own. Treatments are aimed at rehydration. The most serious cases of viral gastroenteritis involve vomiting so severely that you are not able to keep fluids down. In these cases, fluids must be given through an intravenous line (IV). HOME CARE INSTRUCTIONS   Drink enough fluids to keep your urine clear or pale yellow. Drink small amounts of fluids frequently and increase the amounts as tolerated.  Ask your caregiver for specific rehydration instructions.  Avoid:  Foods high in sugar.  Alcohol.  Carbonated drinks.  Tobacco.  Juice.  Caffeine drinks.  Extremely hot or cold fluids.  Fatty, greasy foods.  Too much intake of anything at one time.  Dairy products until 24 to 48 hours after diarrhea stops.  You may consume probiotics. Probiotics are active cultures of beneficial bacteria. They may lessen the amount and number of diarrheal stools in adults. Probiotics can be found in yogurt with active cultures and in supplements.  Wash your hands well to avoid spreading the virus.  Only take over-the-counter or prescription medicines for pain, discomfort, or fever as directed by your caregiver. Do not give aspirin to children.  Antidiarrheal medicines are not recommended.  Ask your caregiver if you should continue to take your regular prescribed and over-the-counter medicines.  Keep all follow-up appointments as directed by your caregiver. SEEK IMMEDIATE MEDICAL CARE IF:   You are unable to keep fluids down.  You do not urinate at least once every 6 to 8 hours.  You develop shortness of breath.  You notice blood in your stool or vomit. This may look like coffee grounds.  You have abdominal pain that increases or is concentrated in one small area (localized).  You have persistent vomiting or diarrhea.  You have a fever.  The patient is a child younger than 3 months, and he or she has a fever.  The patient is a child older than 3 months, and he or she has a fever and persistent symptoms.  The patient is a child older than 3 months, and he or she has a fever and symptoms suddenly get worse.  The patient is a baby, and he or she has no tears when crying. MAKE SURE YOU:   Understand these instructions.  Will watch your condition.  Will get help right away if you are not doing well or get worse. Document Released: 04/18/2005 Document Revised: 07/11/2011 Document Reviewed: 02/02/2011  ExitCare Patient Information 2014 [employer].  

## 2013-08-16 ENCOUNTER — Encounter: Payer: Self-pay | Admitting: Internal Medicine

## 2013-08-16 ENCOUNTER — Ambulatory Visit (INDEPENDENT_AMBULATORY_CARE_PROVIDER_SITE_OTHER): Payer: BC Managed Care – PPO | Admitting: Internal Medicine

## 2013-08-16 VITALS — BP 152/90 | HR 60 | Temp 97.8°F | Resp 20 | Ht 61.5 in | Wt 154.0 lb

## 2013-08-16 DIAGNOSIS — I872 Venous insufficiency (chronic) (peripheral): Secondary | ICD-10-CM

## 2013-08-16 DIAGNOSIS — I10 Essential (primary) hypertension: Secondary | ICD-10-CM

## 2013-08-16 DIAGNOSIS — K219 Gastro-esophageal reflux disease without esophagitis: Secondary | ICD-10-CM

## 2013-08-16 NOTE — Patient Instructions (Signed)
Avoids foods high in acid such as tomatoes citrus juices, and spicy foods.  Avoid eating within two hours of lying down or before exercising.  Do not overheat.  Try smaller more frequent meals.  If symptoms persist, elevate the head of her bed 12 inches while sleeping.  Limit your sodium (Salt) intake    It is important that you exercise regularly, at least 20 minutes 3 to 4 times per week.  If you develop chest pain or shortness of breath seek  medical attention.  You need to lose weight.  Consider a lower calorie diet and regular exercise.  Return in 6 months for follow-up

## 2013-08-16 NOTE — Progress Notes (Signed)
   Subjective:    Patient ID: [name], female    DOB: 09-28-1950, 64 y.o.   MRN: [id number]  HPI Wt Readings from Last 3 Encounters:  08/16/13 154 lb (69.854 kg)  08/07/13 152 lb 4 oz (69.06 kg)  02/28/13 151 lb 12.8 oz (68.856 kg)      Review of Systems     Objective:   Physical Exam        Assessment & Plan:

## 2013-08-16 NOTE — Progress Notes (Signed)
Pre-visit discussion using our clinic review tool. No additional management support is needed unless otherwise documented below in the visit note.  

## 2013-08-16 NOTE — Progress Notes (Signed)
Subjective:    Patient ID: [name], female    DOB: 1950-11-23, 63 y.o.   MRN: [id number]  HPI  63 year old patient who is a transfer from Dr. Jeannine Kitten practice.  Medical problems include treated hypertension.  She has a history of gastroesophageal reflux disease.  Remains on chronic PPI therapy.  She does quite well without concerns or complaints except for her weight.  Past medical history.  Patient has had a hysterectomy in the past.  Other surgeries have included bilateral carpal tongue release, as well as bilateral cataract extraction, surgery  Social history married.  Runs a dry cleaning business.  Nonsmoker.  Active with golf Family history father died at 34.  Complications of COPD.  Mother died 5 of a stroke.  One brother 2 sisters in good health.  Brother with history of tobacco use  Past Medical History  Diagnosis Date  . Hypertension   . Atherosclerosis of other specified arteries   . Venous insufficiency   . Hiatal hernia   . GERD (gastroesophageal reflux disease)   . IBS (irritable bowel syndrome)   . Hemorrhoids   . UTI (lower urinary tract infection)   . Osteopenia   . Low back pain   . Anemia   . Bilateral carpal tunnel syndrome     History   Social History  . Marital Status: Married    Spouse Name: N/A    Number of Children: N/A  . Years of Education: N/A   Occupational History  . dry cleaners    Social History Main Topics  . Smoking status: Never Smoker   . Smokeless tobacco: Never Used  . Alcohol Use: No  . Drug Use: No  . Sexual Activity: Not on file   Other Topics Concern  . Not on file   Social History Narrative  . No narrative on file    Past Surgical History  Procedure Laterality Date  . Abdominal hysterectomy    . Bilateral carpal tunnel release  01/2008 and 06/2005    Dr. Daylene Katayama  . Left cataract and lens  07/2008    Dr. Ellie Lunch  . Hemorrhoid sugery  04/2009    Dr. Dalbert Batman  . Breast reduction surgery  1999  . Endovenous ablation  saphenous vein w/ laser  12-15-2011    right greater saphenous vein by Curt Jews MD  . Endovenous ablation saphenous vein w/ laser  01-26-2012    left greater saphenous vein by Curt Jews MD    Family History  Problem Relation Age of Onset  . Stroke Mother   . COPD Father     Allergies  Allergen Reactions  . Lansoprazole     REACTION: headaches  . Penicillins     REACTION: rash  . Sulfamethoxazole-Trimethoprim     Causes SOB and flu like symptoms    Current Outpatient Prescriptions on File Prior to Visit  Medication Sig Dispense Refill  . Calcium-Vitamin D-Vitamin K (CALCIUM + D) [phone] MG-UNT-MCG CHEW Chew 1 tablet by mouth daily.      . Cholecalciferol (VITAMIN D) 1000 UNITS capsule Take 2,000 Units by mouth daily.       . folic acid (FOLVITE) 1 MG tablet Take 1 mg by mouth daily.        Marland Kitchen losartan (COZAAR) 100 MG tablet TAKE 1 TABLET BY MOUTH DAILY  30 tablet  6  . metoprolol (LOPRESSOR) 50 MG tablet Take 1 tablet (50 mg total) by mouth 2 (two) times daily.  180 tablet  1  . Multiple Vitamins-Minerals (MULTIVITAMIN & MINERAL PO) Take 1 tablet by mouth daily.        Marland Kitchen NEXIUM 40 MG capsule TAKE ONE CAPSULE BY MOUTH DAILY  90 capsule  2   No current facility-administered medications on file prior to visit.    BP 152/90  Pulse 60  Temp(Src) 97.8 F (36.6 C) (Oral)  Resp 20  Ht 5' 1.5" (1.562 m)  Wt 154 lb (69.854 kg)  BMI 28.63 kg/m2  SpO2 98%     Review of Systems  Constitutional: Negative.   HENT: Negative for congestion, dental problem, hearing loss, rhinorrhea, sinus pressure, sore throat and tinnitus.   Eyes: Negative for pain, discharge and visual disturbance.  Respiratory: Negative for cough and shortness of breath.   Cardiovascular: Negative for chest pain, palpitations and leg swelling.  Gastrointestinal: Negative for nausea, vomiting, abdominal pain, diarrhea, constipation, blood in stool and abdominal distention.  Genitourinary: Negative for  dysuria, urgency, frequency, hematuria, flank pain, vaginal bleeding, vaginal discharge, difficulty urinating, vaginal pain and pelvic pain.  Musculoskeletal: Negative for arthralgias, gait problem and joint swelling.  Skin: Negative for rash.  Neurological: Negative for dizziness, syncope, speech difficulty, weakness, numbness and headaches.  Hematological: Negative for adenopathy.  Psychiatric/Behavioral: Negative for behavioral problems, dysphoric mood and agitation. The patient is not nervous/anxious.        Objective:   Physical Exam  Constitutional: She is oriented to person, place, and time. She appears well-developed and well-nourished.  Repeat blood pressure 140/75  HENT:  Head: Normocephalic and atraumatic.  Right Ear: External ear normal.  Left Ear: External ear normal.  Mouth/Throat: Oropharynx is clear and moist.  Eyes: Conjunctivae and EOM are normal.  Neck: Normal range of motion. Neck supple. No JVD present. No thyromegaly present.  Cardiovascular: Normal rate, regular rhythm, normal heart sounds and intact distal pulses.   No murmur heard. Pulmonary/Chest: Effort normal and breath sounds normal. She has no wheezes. She has no rales.  Abdominal: Soft. Bowel sounds are normal. She exhibits no distension and no mass. There is no tenderness. There is no rebound and no guarding.  Genitourinary: Vagina normal.  Musculoskeletal: Normal range of motion. She exhibits no edema and no tenderness.  Neurological: She is alert and oriented to person, place, and time. She has normal reflexes. No cranial nerve deficit. She exhibits normal muscle tone. Coordination normal.  Skin: Skin is warm and dry. No rash noted.  Psychiatric: She has a normal mood and affect. Her behavior is normal.          Assessment & Plan:   Hypertension well controlled History venous insufficiency Gastroesophageal reflux disease  Preventive health examination  Recheck 6 months

## 2013-08-19 ENCOUNTER — Telehealth: Payer: Self-pay | Admitting: Internal Medicine

## 2013-08-19 NOTE — Telephone Encounter (Signed)
Relevant patient education assigned to patient using Emmi. ° °

## 2013-08-22 ENCOUNTER — Ambulatory Visit (INDEPENDENT_AMBULATORY_CARE_PROVIDER_SITE_OTHER): Payer: BC Managed Care – PPO | Admitting: Podiatry

## 2013-08-22 ENCOUNTER — Ambulatory Visit (INDEPENDENT_AMBULATORY_CARE_PROVIDER_SITE_OTHER): Payer: BC Managed Care – PPO

## 2013-08-22 ENCOUNTER — Encounter: Payer: Self-pay | Admitting: Podiatry

## 2013-08-22 VITALS — BP 120/70 | HR 89 | Resp 16

## 2013-08-22 DIAGNOSIS — M722 Plantar fascial fibromatosis: Secondary | ICD-10-CM

## 2013-08-23 NOTE — Progress Notes (Signed)
She presents today with a chief complaint of pain to the lateral aspect of her right foot overlying the sinus tarsi. She states that I think orthotics or not working as well as they were. I have reviewed her past medical history medications and allergies.  Objective: Vital signs are stable she is alert and oriented x3 pulses remain palpable. She has pain on palpation and on end range of motion of the sinus tarsi and subtalar joint of the right foot.  Assessment: Subtalar joint capsulitis right foot.  Plan: She was scanned for a new set of orthotics today.

## 2013-09-02 ENCOUNTER — Ambulatory Visit: Payer: BC Managed Care – PPO | Admitting: Pulmonary Disease

## 2013-09-16 ENCOUNTER — Other Ambulatory Visit: Payer: BC Managed Care – PPO

## 2013-09-17 ENCOUNTER — Other Ambulatory Visit: Payer: BC Managed Care – PPO

## 2013-10-01 ENCOUNTER — Ambulatory Visit (INDEPENDENT_AMBULATORY_CARE_PROVIDER_SITE_OTHER): Payer: BC Managed Care – PPO | Admitting: *Deleted

## 2013-10-01 DIAGNOSIS — M722 Plantar fascial fibromatosis: Secondary | ICD-10-CM

## 2013-10-01 NOTE — Progress Notes (Signed)
   Subjective:    Patient ID: [name], female    DOB: December 22, 1950, 63 y.o.   MRN: [id number]  HPI  PICK UP ORTHOTICS  FOR THE SECOND TIME AND PT IS PLEASE WITH RESULT.    Review of Systems     Objective:   Physical Exam        Assessment & Plan:

## 2013-10-14 ENCOUNTER — Other Ambulatory Visit: Payer: Self-pay | Admitting: Pulmonary Disease

## 2013-10-15 ENCOUNTER — Telehealth: Payer: Self-pay | Admitting: Internal Medicine

## 2013-10-15 MED ORDER — METOPROLOL TARTRATE 50 MG PO TABS: 50.0000 mg | ORAL_TABLET | Freq: Two times a day (BID) | ORAL | Status: DC

## 2013-10-15 MED ORDER — LOSARTAN POTASSIUM 100 MG PO TABS: ORAL_TABLET | ORAL | Status: DC

## 2013-10-15 NOTE — Telephone Encounter (Signed)
Left detailed message Rx's sent to pharmacy as requested. 

## 2013-10-15 NOTE — Telephone Encounter (Signed)
Pt requesting  90 day refills of the followin:  losartan (COZAAR) 100 MG tablet metoprolol (LOPRESSOR) 50 MG tablet  Pharmarcy:  [facility]

## 2013-10-17 ENCOUNTER — Encounter: Payer: Self-pay | Admitting: Podiatry

## 2013-10-17 ENCOUNTER — Ambulatory Visit (INDEPENDENT_AMBULATORY_CARE_PROVIDER_SITE_OTHER): Payer: BC Managed Care – PPO | Admitting: Podiatry

## 2013-10-17 VITALS — BP 122/78 | HR 86 | Resp 12

## 2013-10-17 DIAGNOSIS — M722 Plantar fascial fibromatosis: Secondary | ICD-10-CM

## 2013-10-17 NOTE — Progress Notes (Signed)
She presents today to evaluate her orthotics she states that her feet still hurt of the orthotics do not fifth met previously sent him back positive to wide now they are too narrow we're going to send back one more time for reevaluation and remain will followup with her once those come in. In the meantime she will continue the use of her old orthotics.

## 2013-10-28 ENCOUNTER — Ambulatory Visit (INDEPENDENT_AMBULATORY_CARE_PROVIDER_SITE_OTHER): Payer: BC Managed Care – PPO | Admitting: *Deleted

## 2013-10-28 DIAGNOSIS — M722 Plantar fascial fibromatosis: Secondary | ICD-10-CM

## 2013-10-28 NOTE — Progress Notes (Signed)
   Subjective:    Patient ID: [name], female    DOB: 04/28/1951, 63 y.o.   MRN: [id number]  HPI PUO (RE-DONE).    Review of Systems     Objective:   Physical Exam        Assessment & Plan:

## 2013-11-18 ENCOUNTER — Other Ambulatory Visit: Payer: BC Managed Care – PPO

## 2014-01-13 ENCOUNTER — Encounter: Payer: Self-pay | Admitting: Internal Medicine

## 2014-01-13 ENCOUNTER — Encounter: Payer: Self-pay | Admitting: Gastroenterology

## 2014-02-17 ENCOUNTER — Ambulatory Visit (INDEPENDENT_AMBULATORY_CARE_PROVIDER_SITE_OTHER): Payer: BC Managed Care – PPO | Admitting: Internal Medicine

## 2014-02-17 ENCOUNTER — Encounter: Payer: Self-pay | Admitting: Internal Medicine

## 2014-02-17 VITALS — BP 138/88 | HR 62 | Temp 97.9°F | Resp 20 | Ht 61.5 in | Wt 156.0 lb

## 2014-02-17 DIAGNOSIS — E789 Disorder of lipoprotein metabolism, unspecified: Secondary | ICD-10-CM

## 2014-02-17 DIAGNOSIS — Z0000 Encounter for general adult medical examination without abnormal findings: Secondary | ICD-10-CM

## 2014-02-17 DIAGNOSIS — I10 Essential (primary) hypertension: Secondary | ICD-10-CM

## 2014-02-17 DIAGNOSIS — K219 Gastro-esophageal reflux disease without esophagitis: Secondary | ICD-10-CM

## 2014-02-17 LAB — CBC WITH DIFFERENTIAL/PLATELET
BASOS ABS: 0 10*3/uL (ref 0.0–0.1)
Basophils Relative: 0.6 % (ref 0.0–3.0)
Eosinophils Absolute: 0.1 10*3/uL (ref 0.0–0.7)
Eosinophils Relative: 1.9 % (ref 0.0–5.0)
HEMATOCRIT: 43.6 % (ref 36.0–46.0)
Hemoglobin: 14.6 g/dL (ref 12.0–15.0)
LYMPHS ABS: 1.4 10*3/uL (ref 0.7–4.0)
LYMPHS PCT: 29.6 % (ref 12.0–46.0)
MCHC: 33.4 g/dL (ref 30.0–36.0)
MCV: 93 fl (ref 78.0–100.0)
MONO ABS: 0.3 10*3/uL (ref 0.1–1.0)
Monocytes Relative: 6.8 % (ref 3.0–12.0)
NEUTROS ABS: 3 10*3/uL (ref 1.4–7.7)
Neutrophils Relative %: 61.1 % (ref 43.0–77.0)
Platelets: 216 10*3/uL (ref 150.0–400.0)
RBC: 4.68 Mil/uL (ref 3.87–5.11)
RDW: 12.4 % (ref 11.5–15.5)
WBC: 4.9 10*3/uL (ref 4.0–10.5)

## 2014-02-17 LAB — TSH: TSH: 0.81 u[IU]/mL (ref 0.35–4.50)

## 2014-02-17 LAB — COMPREHENSIVE METABOLIC PANEL
ALT: 22 U/L (ref 0–35)
AST: 28 U/L (ref 0–37)
Albumin: 4.1 g/dL (ref 3.5–5.2)
Alkaline Phosphatase: 51 U/L (ref 39–117)
BUN: 15 mg/dL (ref 6–23)
CALCIUM: 9.6 mg/dL (ref 8.4–10.5)
CHLORIDE: 104 meq/L (ref 96–112)
CO2: 26 mEq/L (ref 19–32)
Creatinine, Ser: 0.7 mg/dL (ref 0.4–1.2)
GFR: 88.58 mL/min (ref >60.00)
Glucose, Bld: 102 mg/dL — ABNORMAL HIGH (ref 70–99)
Potassium: 4.9 mEq/L (ref 3.5–5.1)
Sodium: 143 mEq/L (ref 135–145)
Total Bilirubin: 1.1 mg/dL (ref 0.2–1.2)
Total Protein: 8 g/dL (ref 6.0–8.3)

## 2014-02-17 LAB — LIPID PANEL
Cholesterol: 184 mg/dL (ref 0–200)
HDL: 34.3 mg/dL — AB (ref >39.00)
LDL CALC: 117 mg/dL — AB (ref 0–99)
NONHDL: 149.7
Total CHOL/HDL Ratio: 5
Triglycerides: 166 mg/dL — ABNORMAL HIGH (ref 0.0–149.0)
VLDL: 33.2 mg/dL (ref 0.0–40.0)

## 2014-02-17 NOTE — Progress Notes (Signed)
Pre visit review using our clinic review tool, if applicable. No additional management support is needed unless otherwise documented below in the visit note. 

## 2014-02-17 NOTE — Progress Notes (Signed)
Subjective:    Patient ID: [name], female    DOB: 06-26-1950, 63 y.o.   MRN: [id number]  HPI 63 year old patient who is seen today for followup of essential hypertension.  She has history of mild anemia, gastroesophageal reflux disease, and mild IBS.  Doing quite well without concerns or complaints.  She remains on combination therapy for blood pressure control.  No cardiopulmonary complaints.  Past Medical History  Diagnosis Date  . Hypertension   . Atherosclerosis of other specified arteries   . Venous insufficiency   . Hiatal hernia   . GERD (gastroesophageal reflux disease)   . IBS (irritable bowel syndrome)   . Hemorrhoids   . UTI (lower urinary tract infection)   . Osteopenia   . Low back pain   . Anemia   . Bilateral carpal tunnel syndrome     History   Social History  . Marital Status: Married    Spouse Name: N/A    Number of Children: N/A  . Years of Education: N/A   Occupational History  . dry cleaners    Social History Main Topics  . Smoking status: Never Smoker   . Smokeless tobacco: Never Used  . Alcohol Use: No  . Drug Use: No  . Sexual Activity: Not on file   Other Topics Concern  . Not on file   Social History Narrative  . No narrative on file    Past Surgical History  Procedure Laterality Date  . Abdominal hysterectomy    . Bilateral carpal tunnel release  01/2008 and 06/2005    Dr. Daylene Katayama  . Left cataract and lens  07/2008    Dr. Ellie Lunch  . Hemorrhoid sugery  04/2009    Dr. Dalbert Batman  . Breast reduction surgery  1999  . Endovenous ablation saphenous vein w/ laser  12-15-2011    right greater saphenous vein by Curt Jews MD  . Endovenous ablation saphenous vein w/ laser  01-26-2012    left greater saphenous vein by Curt Jews MD    Family History  Problem Relation Age of Onset  . Stroke Mother   . COPD Father     Allergies  Allergen Reactions  . Lansoprazole     REACTION: headaches  . Penicillins     REACTION: rash  .  Sulfamethoxazole-Trimethoprim     Causes SOB and flu like symptoms    Current Outpatient Prescriptions on File Prior to Visit  Medication Sig Dispense Refill  . Calcium-Vitamin D-Vitamin K (CALCIUM + D) [phone] MG-UNT-MCG CHEW Chew 1 tablet by mouth daily.      . Cholecalciferol (VITAMIN D) 1000 UNITS capsule Take 2,000 Units by mouth daily.       Marland Kitchen ESTRACE VAGINAL 0.1 MG/GM vaginal cream       . folic acid (FOLVITE) 1 MG tablet Take 1 mg by mouth daily.        Marland Kitchen losartan (COZAAR) 100 MG tablet TAKE 1 TABLET BY MOUTH DAILY  90 tablet  1  . metoprolol (LOPRESSOR) 50 MG tablet Take 1 tablet (50 mg total) by mouth 2 (two) times daily.  180 tablet  1  . Multiple Vitamins-Minerals (MULTIVITAMIN & MINERAL PO) Take 1 tablet by mouth daily.         No current facility-administered medications on file prior to visit.    BP 138/88  Pulse 62  Temp(Src) 97.9 F (36.6 C) (Oral)  Resp 20  Ht 5' 1.5" (1.562 m)  Wt 156 lb (70.761 kg)  BMI 29.00 kg/m2  SpO2 99%      Review of Systems  Constitutional: Negative.   HENT: Negative for congestion, dental problem, hearing loss, rhinorrhea, sinus pressure, sore throat and tinnitus.   Eyes: Negative for pain, discharge and visual disturbance.  Respiratory: Negative for cough and shortness of breath.   Cardiovascular: Negative for chest pain, palpitations and leg swelling.  Gastrointestinal: Negative for nausea, vomiting, abdominal pain, diarrhea, constipation, blood in stool and abdominal distention.  Genitourinary: Negative for dysuria, urgency, frequency, hematuria, flank pain, vaginal bleeding, vaginal discharge, difficulty urinating, vaginal pain and pelvic pain.  Musculoskeletal: Negative for arthralgias, gait problem and joint swelling.  Skin: Negative for rash.  Neurological: Negative for dizziness, syncope, speech difficulty, weakness, numbness and headaches.  Hematological: Negative for adenopathy.  Psychiatric/Behavioral: Negative for  behavioral problems, dysphoric mood and agitation. The patient is not nervous/anxious.        Objective:   Physical Exam  Constitutional: She is oriented to person, place, and time. She appears well-developed and well-nourished.  Blood pressure 130/82  HENT:  Head: Normocephalic.  Right Ear: External ear normal.  Left Ear: External ear normal.  Mouth/Throat: Oropharynx is clear and moist.  Eyes: Conjunctivae and EOM are normal. Pupils are equal, round, and reactive to light.  Neck: Normal range of motion. Neck supple. No thyromegaly present.  Cardiovascular: Normal rate, regular rhythm, normal heart sounds and intact distal pulses.   Pulmonary/Chest: Effort normal and breath sounds normal.  Abdominal: Soft. Bowel sounds are normal. She exhibits no mass. There is no tenderness.  Musculoskeletal: Normal range of motion.  Lymphadenopathy:    She has no cervical adenopathy.  Neurological: She is alert and oriented to person, place, and time.  Skin: Skin is warm and dry. No rash noted.  Psychiatric: She has a normal mood and affect. Her behavior is normal.          Assessment & Plan:   Hypertension, stable.  We'll continue present regimen History mild dyslipidemia GERD History of anemia  Medications updated We'll check updated labs Recheck 6 months

## 2014-02-17 NOTE — Patient Instructions (Signed)
Limit your sodium (Salt) intake    It is important that you exercise regularly, at least 20 minutes 3 to 4 times per week.  If you develop chest pain or shortness of breath seek  medical attention.  Return in 6 months for follow-up  

## 2014-02-19 ENCOUNTER — Ambulatory Visit: Payer: BC Managed Care – PPO

## 2014-02-21 ENCOUNTER — Ambulatory Visit (INDEPENDENT_AMBULATORY_CARE_PROVIDER_SITE_OTHER): Payer: BC Managed Care – PPO | Admitting: *Deleted

## 2014-02-21 DIAGNOSIS — Z23 Encounter for immunization: Secondary | ICD-10-CM

## 2014-04-18 ENCOUNTER — Other Ambulatory Visit: Payer: BC Managed Care – PPO

## 2014-04-22 ENCOUNTER — Other Ambulatory Visit: Payer: Self-pay | Admitting: Internal Medicine

## 2014-05-07 ENCOUNTER — Other Ambulatory Visit: Payer: Self-pay

## 2014-05-07 MED ORDER — METOPROLOL TARTRATE 50 MG PO TABS: 50.0000 mg | ORAL_TABLET | Freq: Two times a day (BID) | ORAL | Status: DC

## 2014-05-07 NOTE — Telephone Encounter (Signed)
Rx request for metoprolol tartrate 50 mg tablet- Take 1 tablet by mouth twice daily #180  Pharm:  [facility] [address]  Rx sent to pharmacy.

## 2014-08-05 ENCOUNTER — Ambulatory Visit (INDEPENDENT_AMBULATORY_CARE_PROVIDER_SITE_OTHER): Payer: BLUE CROSS/BLUE SHIELD | Admitting: Internal Medicine

## 2014-08-05 ENCOUNTER — Encounter: Payer: Self-pay | Admitting: Internal Medicine

## 2014-08-05 VITALS — BP 142/80 | HR 63 | Temp 98.0°F | Resp 18 | Ht 61.5 in | Wt 155.0 lb

## 2014-08-05 DIAGNOSIS — R7302 Impaired glucose tolerance (oral): Secondary | ICD-10-CM

## 2014-08-05 DIAGNOSIS — R5382 Chronic fatigue, unspecified: Secondary | ICD-10-CM | POA: Diagnosis not present

## 2014-08-05 DIAGNOSIS — R350 Frequency of micturition: Secondary | ICD-10-CM

## 2014-08-05 DIAGNOSIS — I10 Essential (primary) hypertension: Secondary | ICD-10-CM | POA: Diagnosis not present

## 2014-08-05 DIAGNOSIS — Z87440 Personal history of urinary (tract) infections: Secondary | ICD-10-CM

## 2014-08-05 DIAGNOSIS — IMO0001 Reserved for inherently not codable concepts without codable children: Secondary | ICD-10-CM

## 2014-08-05 LAB — CBC WITH DIFFERENTIAL/PLATELET
Basophils Absolute: 0 10*3/uL (ref 0.0–0.1)
Basophils Relative: 0.9 % (ref 0.0–3.0)
Eosinophils Absolute: 0.1 10*3/uL (ref 0.0–0.7)
Eosinophils Relative: 3 % (ref 0.0–5.0)
HEMATOCRIT: 39.6 % (ref 36.0–46.0)
Hemoglobin: 13.8 g/dL (ref 12.0–15.0)
LYMPHS ABS: 1.3 10*3/uL (ref 0.7–4.0)
Lymphocytes Relative: 37.2 % (ref 12.0–46.0)
MCHC: 34.8 g/dL (ref 30.0–36.0)
MCV: 91.6 fl (ref 78.0–100.0)
Monocytes Absolute: 0.3 10*3/uL (ref 0.1–1.0)
Monocytes Relative: 7.2 % (ref 3.0–12.0)
NEUTROS ABS: 1.8 10*3/uL (ref 1.4–7.7)
Neutrophils Relative %: 51.7 % (ref 43.0–77.0)
Platelets: 192 10*3/uL (ref 150.0–400.0)
RBC: 4.32 Mil/uL (ref 3.87–5.11)
RDW: 12.2 % (ref 11.5–15.5)
WBC: 3.5 10*3/uL — ABNORMAL LOW (ref 4.0–10.5)

## 2014-08-05 LAB — POCT URINALYSIS DIPSTICK
Bilirubin, UA: NEGATIVE
GLUCOSE UA: NEGATIVE
KETONES UA: NEGATIVE
Nitrite, UA: NEGATIVE
PH UA: 6.5
Protein, UA: NEGATIVE
RBC UA: 2
Spec Grav, UA: 1.015
Urobilinogen, UA: 0.2

## 2014-08-05 LAB — TSH: TSH: 1.18 u[IU]/mL (ref 0.35–4.50)

## 2014-08-05 LAB — HEMOGLOBIN A1C: Hgb A1c MFr Bld: 5.7 % (ref 4.6–6.5)

## 2014-08-05 MED ORDER — CIPROFLOXACIN HCL 500 MG PO TABS: 500.0000 mg | ORAL_TABLET | Freq: Two times a day (BID) | ORAL | Status: DC

## 2014-08-05 NOTE — Progress Notes (Signed)
Subjective:    Patient ID: [name], female    DOB: 1951/03/01, 64 y.o.   MRN: [id number]  HPI  64 year old patient who has treated hypertension.  She is seen today for her six-month follow-up.  Today she had the onset of abdominal fullness, discomfort and urinary frequency.  A urinalysis confirmed pyuria and hematuria For the past 2 or 3 months.  She has had some fatigue.  She does have a family history of diabetes and is requesting a hemoglobin A1c.  Laboratory studies were reviewed from 6 months ago and were unremarkable.  Past Medical History  Diagnosis Date  . Hypertension   . Atherosclerosis of other specified arteries   . Venous insufficiency   . Hiatal hernia   . GERD (gastroesophageal reflux disease)   . IBS (irritable bowel syndrome)   . Hemorrhoids   . UTI (lower urinary tract infection)   . Osteopenia   . Low back pain   . Anemia   . Bilateral carpal tunnel syndrome     History   Social History  . Marital Status: Married    Spouse Name: N/A  . Number of Children: N/A  . Years of Education: N/A   Occupational History  . dry cleaners    Social History Main Topics  . Smoking status: Never Smoker   . Smokeless tobacco: Never Used  . Alcohol Use: No  . Drug Use: No  . Sexual Activity: Not on file   Other Topics Concern  . Not on file   Social History Narrative    Past Surgical History  Procedure Laterality Date  . Abdominal hysterectomy    . Bilateral carpal tunnel release  01/2008 and 06/2005    Dr. Daylene Katayama  . Left cataract and lens  07/2008    Dr. Ellie Lunch  . Hemorrhoid sugery  04/2009    Dr. Dalbert Batman  . Breast reduction surgery  1999  . Endovenous ablation saphenous vein w/ laser  12-15-2011    right greater saphenous vein by Curt Jews MD  . Endovenous ablation saphenous vein w/ laser  01-26-2012    left greater saphenous vein by Curt Jews MD    Family History  Problem Relation Age of Onset  . Stroke Mother   . COPD Father     Allergies    Allergen Reactions  . Lansoprazole     REACTION: headaches  . Penicillins     REACTION: rash  . Sulfamethoxazole-Trimethoprim     Causes SOB and flu like symptoms    Current Outpatient Prescriptions on File Prior to Visit  Medication Sig Dispense Refill  . Calcium-Vitamin D-Vitamin K (CALCIUM + D) [phone] MG-UNT-MCG CHEW Chew 1 tablet by mouth daily.    . Cholecalciferol (VITAMIN D) 1000 UNITS capsule Take 2,000 Units by mouth daily.     Marland Kitchen ESTRACE VAGINAL 0.1 MG/GM vaginal cream     . folic acid (FOLVITE) 1 MG tablet Take 1 mg by mouth daily.      Marland Kitchen losartan (COZAAR) 100 MG tablet TAKE 1 TABLET BY MOUTH EVERY DAY 90 tablet 1  . metoprolol (LOPRESSOR) 50 MG tablet Take 1 tablet (50 mg total) by mouth 2 (two) times daily. 180 tablet 0  . Multiple Vitamins-Minerals (MULTIVITAMIN & MINERAL PO) Take 1 tablet by mouth daily.      Marland Kitchen omeprazole (PRILOSEC OTC) 20 MG tablet Take 20 mg by mouth daily.     No current facility-administered medications on file prior to visit.  BP 142/80 mmHg  Pulse 63  Temp(Src) 98 F (36.7 C) (Oral)  Resp 18  Ht 5' 1.5" (1.562 m)  Wt 155 lb (70.308 kg)  BMI 28.82 kg/m2  SpO2 98%      Review of Systems  Constitutional: Positive for fatigue.  HENT: Negative for congestion, dental problem, hearing loss, rhinorrhea, sinus pressure, sore throat and tinnitus.   Eyes: Negative for pain, discharge and visual disturbance.  Respiratory: Negative for cough and shortness of breath.   Cardiovascular: Negative for chest pain, palpitations and leg swelling.  Gastrointestinal: Negative for nausea, vomiting, abdominal pain, diarrhea, constipation, blood in stool and abdominal distention.  Genitourinary: Positive for dysuria, frequency and pelvic pain. Negative for urgency, hematuria, flank pain, vaginal bleeding, vaginal discharge, difficulty urinating and vaginal pain.  Musculoskeletal: Negative for joint swelling, arthralgias and gait problem.  Skin:  Negative for rash.  Neurological: Positive for weakness. Negative for dizziness, syncope, speech difficulty, numbness and headaches.  Hematological: Negative for adenopathy.  Psychiatric/Behavioral: Negative for behavioral problems, dysphoric mood and agitation. The patient is not nervous/anxious.        Objective:   Physical Exam  Constitutional: She is oriented to person, place, and time. She appears well-developed and well-nourished.  HENT:  Head: Normocephalic.  Right Ear: External ear normal.  Left Ear: External ear normal.  Mouth/Throat: Oropharynx is clear and moist.  Eyes: Conjunctivae and EOM are normal. Pupils are equal, round, and reactive to light.  Neck: Normal range of motion. Neck supple. No thyromegaly present.  Cardiovascular: Normal rate, regular rhythm, normal heart sounds and intact distal pulses.   Pulmonary/Chest: Effort normal and breath sounds normal.  Abdominal: Soft. Bowel sounds are normal. She exhibits no mass. There is no tenderness.  Musculoskeletal: Normal range of motion.  Lymphadenopathy:    She has no cervical adenopathy.  Neurological: She is alert and oriented to person, place, and time.  Skin: Skin is warm and dry. No rash noted.  Psychiatric: She has a normal mood and affect. Her behavior is normal.          Assessment & Plan:   Acute urinary tract infection.  Will treat with Cipro for 3 days  Hypertension, well-controlled  Fatigue.  We'll check some updated lab.  In spite of her fatigue.  She continues to work and also plays golf 2 times per week   CPX 6 months

## 2014-08-05 NOTE — Progress Notes (Signed)
Subjective:    Patient ID: [name], female    DOB: 01-09-1951, 64 y.o.   MRN: [id number]  HPI  Wt Readings from Last 3 Encounters:  08/05/14 155 lb (70.308 kg)  02/17/14 156 lb (70.761 kg)  08/16/13 154 lb (69.79 kg)   64 year old patient who is seen today in follow-up.  She has a history of essential hypertension. For the past 24 hours she has developed burning dysuria and suprapubic discomfort. For the past 2 or 3 months, she has had which she described as worsening fatigue.  She continues to be active with activities such as golf and work.  No change in weight.  No other constitutional complaints.  She does have a prior history of anemia, but this was stable at the time of her annual exam  Past Medical History  Diagnosis Date  . Hypertension   . Atherosclerosis of other specified arteries   . Venous insufficiency   . Hiatal hernia   . GERD (gastroesophageal reflux disease)   . IBS (irritable bowel syndrome)   . Hemorrhoids   . UTI (lower urinary tract infection)   . Osteopenia   . Low back pain   . Anemia   . Bilateral carpal tunnel syndrome     History   Social History  . Marital Status: Married    Spouse Name: N/A  . Number of Children: N/A  . Years of Education: N/A   Occupational History  . dry cleaners    Social History Main Topics  . Smoking status: Never Smoker   . Smokeless tobacco: Never Used  . Alcohol Use: No  . Drug Use: No  . Sexual Activity: Not on file   Other Topics Concern  . Not on file   Social History Narrative    Past Surgical History  Procedure Laterality Date  . Abdominal hysterectomy    . Bilateral carpal tunnel release  01/2008 and 06/2005    Dr. Daylene Katayama  . Left cataract and lens  07/2008    Dr. Ellie Lunch  . Hemorrhoid sugery  04/2009    Dr. Dalbert Batman  . Breast reduction surgery  1999  . Endovenous ablation saphenous vein w/ laser  12-15-2011    right greater saphenous vein by Curt Jews MD  . Endovenous ablation saphenous  vein w/ laser  01-26-2012    left greater saphenous vein by Curt Jews MD    Family History  Problem Relation Age of Onset  . Stroke Mother   . COPD Father     Allergies  Allergen Reactions  . Lansoprazole     REACTION: headaches  . Penicillins     REACTION: rash  . Sulfamethoxazole-Trimethoprim     Causes SOB and flu like symptoms    Current Outpatient Prescriptions on File Prior to Visit  Medication Sig Dispense Refill  . Calcium-Vitamin D-Vitamin K (CALCIUM + D) [phone] MG-UNT-MCG CHEW Chew 1 tablet by mouth daily.    . Cholecalciferol (VITAMIN D) 1000 UNITS capsule Take 2,000 Units by mouth daily.     Marland Kitchen ESTRACE VAGINAL 0.1 MG/GM vaginal cream     . folic acid (FOLVITE) 1 MG tablet Take 1 mg by mouth daily.      Marland Kitchen losartan (COZAAR) 100 MG tablet TAKE 1 TABLET BY MOUTH EVERY DAY 90 tablet 1  . metoprolol (LOPRESSOR) 50 MG tablet Take 1 tablet (50 mg total) by mouth 2 (two) times daily. 180 tablet 0  . Multiple Vitamins-Minerals (MULTIVITAMIN & MINERAL PO) Take 1  tablet by mouth daily.      Marland Kitchen omeprazole (PRILOSEC OTC) 20 MG tablet Take 20 mg by mouth daily.     No current facility-administered medications on file prior to visit.    BP 142/80 mmHg  Pulse 63  Temp(Src) 98 F (36.7 C) (Oral)  Resp 18  Ht 5' 1.5" (1.562 m)  Wt 155 lb (70.308 kg)  BMI 28.82 kg/m2  SpO2 98%    Review of Systems  Constitutional: Positive for fatigue.  HENT: Negative for congestion, dental problem, hearing loss, rhinorrhea, sinus pressure, sore throat and tinnitus.   Eyes: Negative for pain, discharge and visual disturbance.  Respiratory: Negative for cough and shortness of breath.   Cardiovascular: Negative for chest pain, palpitations and leg swelling.  Gastrointestinal: Positive for abdominal pain. Negative for nausea, vomiting, diarrhea, constipation, blood in stool and abdominal distention.  Genitourinary: Positive for dysuria and frequency. Negative for urgency, hematuria,  flank pain, vaginal bleeding, vaginal discharge, difficulty urinating, vaginal pain and pelvic pain.  Musculoskeletal: Negative for joint swelling, arthralgias and gait problem.  Skin: Negative for rash.  Neurological: Negative for dizziness, syncope, speech difficulty, weakness, numbness and headaches.  Hematological: Negative for adenopathy.  Psychiatric/Behavioral: Negative for behavioral problems, dysphoric mood and agitation. The patient is not nervous/anxious.        Objective:   Physical Exam  Constitutional: She is oriented to person, place, and time. She appears well-developed and well-nourished.  HENT:  Head: Normocephalic.  Right Ear: External ear normal.  Left Ear: External ear normal.  Mouth/Throat: Oropharynx is clear and moist.  Eyes: Conjunctivae and EOM are normal. Pupils are equal, round, and reactive to light.  Neck: Normal range of motion. Neck supple. No thyromegaly present.  Cardiovascular: Normal rate, regular rhythm, normal heart sounds and intact distal pulses.   Pulmonary/Chest: Effort normal and breath sounds normal.  Abdominal: Soft. Bowel sounds are normal. She exhibits no mass. There is no tenderness.  Musculoskeletal: Normal range of motion.  Lymphadenopathy:    She has no cervical adenopathy.  Neurological: She is alert and oriented to person, place, and time.  Skin: Skin is warm and dry. No rash noted.  Psychiatric: She has a normal mood and affect. Her behavior is normal.          Assessment & Plan:   Acute urinary tract infection.  Will treat with Cipro 500 twice a day for 3 days Essential hypertension, stable Fatigue.  We'll check some updated lab.  Recheck 6 weeks

## 2014-08-05 NOTE — Patient Instructions (Signed)
Take your antibiotic as prescribed until ALL of it is gone, but stop if you develop a rash, swelling, or any side effects of the medication.  Contact our office as soon as possible if  there are side effects of the medication.    It is important that you exercise regularly, at least 20 minutes 3 to 4 times per week.  If you develop chest pain or shortness of breath seek  medical attention.  Call or return to clinic prn if these symptoms worsen or fail to improve as anticipated.

## 2014-08-07 ENCOUNTER — Telehealth: Payer: Self-pay | Admitting: *Deleted

## 2014-08-07 ENCOUNTER — Other Ambulatory Visit: Payer: Self-pay | Admitting: Internal Medicine

## 2014-08-07 NOTE — Telephone Encounter (Signed)
Pt wanted her lab results, called pt told her labs were normal TSH, CBC and Hemoglobin A1c was 5.7. Pt verbalized understanding and asked copy be mailed. Told pt will mail copy of labs. Pt verbalized understanding. Copy of labs mailed.

## 2014-10-09 ENCOUNTER — Emergency Department (HOSPITAL_COMMUNITY)
Admission: EM | Admit: 2014-10-09 | Discharge: 2014-10-09 | Disposition: A | Discharge status: Home / Self Care | Payer: BLUE CROSS/BLUE SHIELD | Attending: Emergency Medicine | Admitting: Emergency Medicine

## 2014-10-09 ENCOUNTER — Encounter (HOSPITAL_COMMUNITY): Payer: Self-pay | Admitting: Emergency Medicine

## 2014-10-09 DIAGNOSIS — Y9389 Activity, other specified: Secondary | ICD-10-CM | POA: Diagnosis not present

## 2014-10-09 DIAGNOSIS — Z792 Long term (current) use of antibiotics: Secondary | ICD-10-CM | POA: Insufficient documentation

## 2014-10-09 DIAGNOSIS — Z862 Personal history of diseases of the blood and blood-forming organs and certain disorders involving the immune mechanism: Secondary | ICD-10-CM | POA: Insufficient documentation

## 2014-10-09 DIAGNOSIS — Z87440 Personal history of urinary (tract) infections: Secondary | ICD-10-CM | POA: Insufficient documentation

## 2014-10-09 DIAGNOSIS — S3992XA Unspecified injury of lower back, initial encounter: Secondary | ICD-10-CM | POA: Insufficient documentation

## 2014-10-09 DIAGNOSIS — K219 Gastro-esophageal reflux disease without esophagitis: Secondary | ICD-10-CM | POA: Insufficient documentation

## 2014-10-09 DIAGNOSIS — Z880 Allergy status to penicillin: Secondary | ICD-10-CM | POA: Diagnosis not present

## 2014-10-09 DIAGNOSIS — Y998 Other external cause status: Secondary | ICD-10-CM | POA: Diagnosis not present

## 2014-10-09 DIAGNOSIS — Z79899 Other long term (current) drug therapy: Secondary | ICD-10-CM | POA: Insufficient documentation

## 2014-10-09 DIAGNOSIS — S199XXA Unspecified injury of neck, initial encounter: Secondary | ICD-10-CM | POA: Insufficient documentation

## 2014-10-09 DIAGNOSIS — Z041 Encounter for examination and observation following transport accident: Secondary | ICD-10-CM | POA: Diagnosis present

## 2014-10-09 DIAGNOSIS — S4991XA Unspecified injury of right shoulder and upper arm, initial encounter: Secondary | ICD-10-CM | POA: Diagnosis not present

## 2014-10-09 DIAGNOSIS — Z8739 Personal history of other diseases of the musculoskeletal system and connective tissue: Secondary | ICD-10-CM | POA: Diagnosis not present

## 2014-10-09 DIAGNOSIS — Y92410 Unspecified street and highway as the place of occurrence of the external cause: Secondary | ICD-10-CM | POA: Insufficient documentation

## 2014-10-09 DIAGNOSIS — S4992XA Unspecified injury of left shoulder and upper arm, initial encounter: Secondary | ICD-10-CM | POA: Diagnosis not present

## 2014-10-09 DIAGNOSIS — I10 Essential (primary) hypertension: Secondary | ICD-10-CM | POA: Diagnosis not present

## 2014-10-09 DIAGNOSIS — Z8669 Personal history of other diseases of the nervous system and sense organs: Secondary | ICD-10-CM | POA: Insufficient documentation

## 2014-10-09 MED ORDER — METHOCARBAMOL 500 MG PO TABS: 500.0000 mg | ORAL_TABLET | Freq: Two times a day (BID) | ORAL | Status: DC

## 2014-10-09 MED ORDER — NAPROXEN 500 MG PO TABS: 500.0000 mg | ORAL_TABLET | Freq: Two times a day (BID) | ORAL | Status: DC
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
## 2014-10-09 NOTE — Discharge Instructions (Signed)
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
## 2014-10-29 ENCOUNTER — Other Ambulatory Visit: Payer: Self-pay | Admitting: Internal Medicine

## 2014-12-18 ENCOUNTER — Ambulatory Visit (INDEPENDENT_AMBULATORY_CARE_PROVIDER_SITE_OTHER): Payer: BLUE CROSS/BLUE SHIELD | Admitting: Internal Medicine

## 2014-12-18 ENCOUNTER — Encounter: Payer: Self-pay | Admitting: Internal Medicine

## 2014-12-18 VITALS — BP 140/80 | HR 63 | Temp 98.0°F | Resp 20 | Ht 61.5 in | Wt 154.0 lb

## 2014-12-18 DIAGNOSIS — K219 Gastro-esophageal reflux disease without esophagitis: Secondary | ICD-10-CM

## 2014-12-18 DIAGNOSIS — D649 Anemia, unspecified: Secondary | ICD-10-CM

## 2014-12-18 DIAGNOSIS — R51 Headache: Secondary | ICD-10-CM

## 2014-12-18 DIAGNOSIS — R519 Headache, unspecified: Secondary | ICD-10-CM

## 2014-12-18 DIAGNOSIS — I10 Essential (primary) hypertension: Secondary | ICD-10-CM | POA: Diagnosis not present

## 2014-12-18 DIAGNOSIS — R1013 Epigastric pain: Secondary | ICD-10-CM

## 2014-12-18 LAB — COMPREHENSIVE METABOLIC PANEL
ALK PHOS: 50 U/L (ref 39–117)
ALT: 24 U/L (ref 0–35)
AST: 31 U/L (ref 0–37)
Albumin: 4.4 g/dL (ref 3.5–5.2)
BILIRUBIN TOTAL: 1.4 mg/dL — AB (ref 0.2–1.2)
BUN: 16 mg/dL (ref 6–23)
CALCIUM: 9.7 mg/dL (ref 8.4–10.5)
CO2: 32 mEq/L (ref 19–32)
CREATININE: 0.68 mg/dL (ref 0.40–1.20)
Chloride: 104 mEq/L (ref 96–112)
GFR: 92.85 mL/min (ref >60.00)
GLUCOSE: 87 mg/dL (ref 70–99)
Potassium: 4.3 mEq/L (ref 3.5–5.1)
Sodium: 140 mEq/L (ref 135–145)
TOTAL PROTEIN: 7.3 g/dL (ref 6.0–8.3)

## 2014-12-18 LAB — CBC WITH DIFFERENTIAL/PLATELET
BASOS ABS: 0 10*3/uL (ref 0.0–0.1)
Basophils Relative: 0.7 % (ref 0.0–3.0)
EOS ABS: 0.1 10*3/uL (ref 0.0–0.7)
Eosinophils Relative: 3.1 % (ref 0.0–5.0)
HEMATOCRIT: 39.5 % (ref 36.0–46.0)
Hemoglobin: 13.6 g/dL (ref 12.0–15.0)
LYMPHS PCT: 37.2 % (ref 12.0–46.0)
Lymphs Abs: 1.6 10*3/uL (ref 0.7–4.0)
MCHC: 34.4 g/dL (ref 30.0–36.0)
MCV: 91.6 fl (ref 78.0–100.0)
Monocytes Absolute: 0.3 10*3/uL (ref 0.1–1.0)
Monocytes Relative: 7.5 % (ref 3.0–12.0)
NEUTROS PCT: 51.5 % (ref 43.0–77.0)
Neutro Abs: 2.2 10*3/uL (ref 1.4–7.7)
PLATELETS: 190 10*3/uL (ref 150.0–400.0)
RBC: 4.31 Mil/uL (ref 3.87–5.11)
RDW: 12.5 % (ref 11.5–15.5)
WBC: 4.4 10*3/uL (ref 4.0–10.5)

## 2014-12-18 LAB — SEDIMENTATION RATE: Sed Rate: 7 mm/hr (ref 0–22)

## 2014-12-18 MED ORDER — TRAMADOL HCL 50 MG PO TABS: 50.0000 mg | ORAL_TABLET | Freq: Four times a day (QID) | ORAL | Status: DC | PRN

## 2014-12-18 NOTE — Progress Notes (Signed)
Pre visit review using our clinic review tool, if applicable. No additional management support is needed unless otherwise documented below in the visit note. 

## 2014-12-18 NOTE — Progress Notes (Signed)
Subjective:    Patient ID: [name], female    DOB: 1950-05-16, 64 y.o.   MRN: [id number]  HPI  64 year old patient who has essential hypertension and a history of reflux.  She also has a history of IBS.  For the past 6 or 7 weeks she has had mainly right-sided headaches.  She describes these as a tearing type sensation.  She has rare headaches that are usually occipital.  She has been using Tylenol with variable relief.  No history of a chronic headache disorder She does have a history of reflux.  For the past 3 days she has had some epigastric pain.  She has been on maintenance omeprazole 20 mg daily No nausea or vomiting.  No change in her bowel habits. She has treated hypertension which has been stable  Past Medical History  Diagnosis Date  . Hypertension   . Atherosclerosis of other specified arteries   . Venous insufficiency   . Hiatal hernia   . GERD (gastroesophageal reflux disease)   . IBS (irritable bowel syndrome)   . Hemorrhoids   . UTI (lower urinary tract infection)   . Osteopenia   . Low back pain   . Anemia   . Bilateral carpal tunnel syndrome     Social History   Social History  . Marital Status: Married    Spouse Name: N/A  . Number of Children: N/A  . Years of Education: N/A   Occupational History  . dry cleaners    Social History Main Topics  . Smoking status: Never Smoker   . Smokeless tobacco: Never Used  . Alcohol Use: No  . Drug Use: No  . Sexual Activity: Not on file   Other Topics Concern  . Not on file   Social History Narrative    Past Surgical History  Procedure Laterality Date  . Abdominal hysterectomy    . Bilateral carpal tunnel release  01/2008 and 06/2005    Dr. Daylene Katayama  . Left cataract and lens  07/2008    Dr. Ellie Lunch  . Hemorrhoid sugery  04/2009    Dr. Dalbert Batman  . Breast reduction surgery  1999  . Endovenous ablation saphenous vein w/ laser  12-15-2011    right greater saphenous vein by Curt Jews MD  . Endovenous  ablation saphenous vein w/ laser  01-26-2012    left greater saphenous vein by Curt Jews MD    Family History  Problem Relation Age of Onset  . Stroke Mother   . COPD Father     Allergies  Allergen Reactions  . Lansoprazole     REACTION: headaches  . Penicillins     REACTION: rash  . Sulfamethoxazole-Trimethoprim     Causes SOB and flu like symptoms    Current Outpatient Prescriptions on File Prior to Visit  Medication Sig Dispense Refill  . Calcium-Vitamin D-Vitamin K (CALCIUM + D) [phone] MG-UNT-MCG CHEW Chew 1 tablet by mouth daily.    . Cholecalciferol (VITAMIN D) 1000 UNITS capsule Take 2,000 Units by mouth daily.     . folic acid (FOLVITE) 1 MG tablet Take 1 mg by mouth daily.      Marland Kitchen KRILL OIL PO Take 1 tablet by mouth daily.    Marland Kitchen losartan (COZAAR) 100 MG tablet TAKE 1 TABLET BY MOUTH EVERY DAY 90 tablet 0  . metoprolol (LOPRESSOR) 50 MG tablet TAKE 1 TABLET BY MOUTH TWICE DAILY. 180 tablet 1  . Multiple Vitamins-Minerals (MULTIVITAMIN & MINERAL PO) Take 1  tablet by mouth daily.      Marland Kitchen omeprazole (PRILOSEC OTC) 20 MG tablet Take 20 mg by mouth daily.    . valACYclovir (VALTREX) 1000 MG tablet Take 1,000 mg by mouth as needed.   3  . naproxen (NAPROSYN) 500 MG tablet Take 1 tablet (500 mg total) by mouth 2 (two) times daily. (Patient not taking: Reported on 12/18/2014) 30 tablet 0   No current facility-administered medications on file prior to visit.    BP 140/80 mmHg  Pulse 63  Temp(Src) 98 F (36.7 C) (Oral)  Resp 20  Ht 5' 1.5" (1.562 m)  Wt 154 lb (69.854 kg)  BMI 28.63 kg/m2  SpO2 97%     Review of Systems  Constitutional: Negative.   HENT: Negative for congestion, dental problem, hearing loss, rhinorrhea, sinus pressure, sore throat and tinnitus.   Eyes: Negative for pain, discharge and visual disturbance.  Respiratory: Negative for cough and shortness of breath.   Cardiovascular: Negative for chest pain, palpitations and leg swelling.    Gastrointestinal: Positive for abdominal pain. Negative for nausea, vomiting, diarrhea, constipation, blood in stool and abdominal distention.  Genitourinary: Negative for dysuria, urgency, frequency, hematuria, flank pain, vaginal bleeding, vaginal discharge, difficulty urinating, vaginal pain and pelvic pain.  Musculoskeletal: Negative for joint swelling, arthralgias and gait problem.  Skin: Negative for rash.  Neurological: Positive for headaches. Negative for dizziness, syncope, speech difficulty, weakness and numbness.  Hematological: Negative for adenopathy.  Psychiatric/Behavioral: Negative for behavioral problems, dysphoric mood and agitation. The patient is not nervous/anxious.        Objective:   Physical Exam  Constitutional: She is oriented to person, place, and time. She appears well-developed and well-nourished.  HENT:  Head: Normocephalic.  Right Ear: External ear normal.  Left Ear: External ear normal.  Mouth/Throat: Oropharynx is clear and moist.  No temporal artery tenderness  Eyes: Conjunctivae and EOM are normal. Pupils are equal, round, and reactive to light.  Neck: Normal range of motion. Neck supple. No thyromegaly present.  Cardiovascular: Normal rate, regular rhythm, normal heart sounds and intact distal pulses.   Pulmonary/Chest: Effort normal and breath sounds normal.  Abdominal: Soft. Bowel sounds are normal. She exhibits no mass. There is tenderness.  Mild epigastric tenderness  Musculoskeletal: Normal range of motion.  Lymphadenopathy:    She has no cervical adenopathy.  Neurological: She is alert and oriented to person, place, and time.  Skin: Skin is warm and dry. No rash noted.  Psychiatric: She has a normal mood and affect. Her behavior is normal.          Assessment & Plan:   Headache disorder.  New onset of about 6 weeks duration.  Normal neurological examination.  Will check screening lab including a sedimentation rate;  treat with Ultram  and observe Epigastric pain.  Will place on a bland diet.  Increase omeprazole to 40 mg daily  Recheck 4 weeks or as needed.  Will report any new or worsening symptoms.

## 2014-12-18 NOTE — Patient Instructions (Signed)
Limit your sodium (Salt) intake  Avoids foods high in acid such as tomatoes citrus juices, and spicy foods.  Avoid eating within two hours of lying down or before exercising.  Do not overheat.  Try smaller more frequent meals.  If symptoms persist, elevate the head of her bed 12 inches while sleeping.  Increase Prilosec (omeprazole) 2 twice daily  Return in 4 weeks for follow-up or sooner if you develop any new or worsening symptoms

## 2015-01-13 ENCOUNTER — Telehealth: Payer: Self-pay | Admitting: Internal Medicine

## 2015-01-13 NOTE — Telephone Encounter (Signed)
Spoke to her - she requested to see me and I told her we would call and arrange an appointment  She can be worked in to a hidden slot this month or oct please

## 2015-01-13 NOTE — Telephone Encounter (Signed)
Left message for patient to call back and we will set up appointment to see Dr Carlean Purl either in Sept or Oct in an END spot if no other ones available per Dr Carlean Purl. She will need new pt paperwork as if has been years since seen.  Reason for current visit is dyspepsia.

## 2015-01-19 ENCOUNTER — Ambulatory Visit: Payer: BLUE CROSS/BLUE SHIELD | Admitting: Internal Medicine

## 2015-01-19 ENCOUNTER — Telehealth: Payer: Self-pay | Admitting: Medical Oncology

## 2015-01-19 NOTE — Telephone Encounter (Signed)
Error no call made 

## 2015-01-20 ENCOUNTER — Ambulatory Visit (INDEPENDENT_AMBULATORY_CARE_PROVIDER_SITE_OTHER): Payer: BLUE CROSS/BLUE SHIELD | Admitting: Internal Medicine

## 2015-01-20 ENCOUNTER — Encounter: Payer: Self-pay | Admitting: Internal Medicine

## 2015-01-20 VITALS — BP 144/80 | HR 70 | Temp 98.4°F | Resp 20 | Ht 61.5 in | Wt 153.0 lb

## 2015-01-20 DIAGNOSIS — I10 Essential (primary) hypertension: Secondary | ICD-10-CM | POA: Diagnosis not present

## 2015-01-20 DIAGNOSIS — R51 Headache: Secondary | ICD-10-CM

## 2015-01-20 DIAGNOSIS — Z23 Encounter for immunization: Secondary | ICD-10-CM | POA: Diagnosis not present

## 2015-01-20 DIAGNOSIS — R519 Headache, unspecified: Secondary | ICD-10-CM

## 2015-01-20 DIAGNOSIS — K219 Gastro-esophageal reflux disease without esophagitis: Secondary | ICD-10-CM | POA: Diagnosis not present

## 2015-01-20 NOTE — Patient Instructions (Signed)
Report any worse or new symptoms  Limit your sodium (Salt) intake    It is important that you exercise regularly, at least 20 minutes 3 to 4 times per week.  If you develop chest pain or shortness of breath seek  medical attention.

## 2015-01-20 NOTE — Progress Notes (Signed)
Subjective:    Patient ID: [name], female    DOB: 1950-11-18, 64 y.o.   MRN: [id number]  HPI  64 year old patient who is seen today in follow-up for 2 issues.  She has had fairly recent onset of headaches.  These still are bothersome but are quickly and completely relieved by Tylenol.  Otherwise feels well.  No allergy symptoms, or any significant stressors. Brother concern one month ago.  His epigastric pain.  This has resolved  No focal neurological symptoms  Past Medical History  Diagnosis Date  . Hypertension   . Atherosclerosis of other specified arteries   . Venous insufficiency   . Hiatal hernia   . GERD (gastroesophageal reflux disease)   . IBS (irritable bowel syndrome)   . Hemorrhoids   . UTI (lower urinary tract infection)   . Osteopenia   . Low back pain   . Anemia   . Bilateral carpal tunnel syndrome     Social History   Social History  . Marital Status: Married    Spouse Name: N/A  . Number of Children: N/A  . Years of Education: N/A   Occupational History  . dry cleaners    Social History Main Topics  . Smoking status: Never Smoker   . Smokeless tobacco: Never Used  . Alcohol Use: No  . Drug Use: No  . Sexual Activity: Not on file   Other Topics Concern  . Not on file   Social History Narrative    Past Surgical History  Procedure Laterality Date  . Abdominal hysterectomy    . Bilateral carpal tunnel release  01/2008 and 06/2005    Dr. Daylene Katayama  . Left cataract and lens  07/2008    Dr. Ellie Lunch  . Hemorrhoid sugery  04/2009    Dr. Dalbert Batman  . Breast reduction surgery  1999  . Endovenous ablation saphenous vein w/ laser  12-15-2011    right greater saphenous vein by Curt Jews MD  . Endovenous ablation saphenous vein w/ laser  01-26-2012    left greater saphenous vein by Curt Jews MD    Family History  Problem Relation Age of Onset  . Stroke Mother   . COPD Father     Allergies  Allergen Reactions  . Tramadol Other (See Comments)      Insomnia  . Lansoprazole     REACTION: headaches  . Penicillins     REACTION: rash  . Sulfamethoxazole-Trimethoprim     Causes SOB and flu like symptoms    Current Outpatient Prescriptions on File Prior to Visit  Medication Sig Dispense Refill  . Calcium-Vitamin D-Vitamin K (CALCIUM + D) [phone] MG-UNT-MCG CHEW Chew 1 tablet by mouth daily.    . Cholecalciferol (VITAMIN D) 1000 UNITS capsule Take 2,000 Units by mouth daily.     . folic acid (FOLVITE) 1 MG tablet Take 1 mg by mouth daily.      Marland Kitchen KRILL OIL PO Take 1 tablet by mouth daily.    Marland Kitchen losartan (COZAAR) 100 MG tablet TAKE 1 TABLET BY MOUTH EVERY DAY 90 tablet 0  . metoprolol (LOPRESSOR) 50 MG tablet TAKE 1 TABLET BY MOUTH TWICE DAILY. 180 tablet 1  . Multiple Vitamins-Minerals (MULTIVITAMIN & MINERAL PO) Take 1 tablet by mouth daily.      Marland Kitchen omeprazole (PRILOSEC OTC) 20 MG tablet Take 20 mg by mouth daily.    . Probiotic Product (PROBIOTIC DAILY PO) Take 1 tablet by mouth daily.    . valACYclovir (  VALTREX) 1000 MG tablet Take 1,000 mg by mouth as needed.   3   No current facility-administered medications on file prior to visit.    BP 144/80 mmHg  Pulse 70  Temp(Src) 98.4 F (36.9 C) (Oral)  Resp 20  Ht 5' 1.5" (1.562 m)  Wt 153 lb (69.4 kg)  BMI 28.44 kg/m2  SpO2 98%     Review of Systems  Constitutional: Negative.   HENT: Negative for congestion, dental problem, hearing loss, rhinorrhea, sinus pressure, sore throat and tinnitus.   Eyes: Negative for pain, discharge and visual disturbance.  Respiratory: Negative for cough and shortness of breath.   Cardiovascular: Negative for chest pain, palpitations and leg swelling.  Gastrointestinal: Negative for nausea, vomiting, abdominal pain, diarrhea, constipation, blood in stool and abdominal distention.  Genitourinary: Negative for dysuria, urgency, frequency, hematuria, flank pain, vaginal bleeding, vaginal discharge, difficulty urinating, vaginal pain and pelvic  pain.  Musculoskeletal: Negative for joint swelling, arthralgias and gait problem.  Skin: Negative for rash.  Neurological: Positive for headaches. Negative for dizziness, syncope, speech difficulty, weakness and numbness.  Hematological: Negative for adenopathy.  Psychiatric/Behavioral: Negative for behavioral problems, dysphoric mood and agitation. The patient is not nervous/anxious.        Objective:   Physical Exam  Constitutional: She is oriented to person, place, and time. She appears well-developed and well-nourished. No distress.  Blood pressure 134/80  Neurological: She is alert and oriented to person, place, and time. She has normal strength and normal reflexes. No cranial nerve deficit. She displays a negative Romberg sign. Coordination and gait normal.          Assessment & Plan:   Headache syndrome.  These are fairly mild and completed relieved by Tylenol.  Neuro exam is nonfocal.  Will continue to observe.  Will consider imaging studies.  If headaches worsen or she develops any new or worsening symptoms Essential hypertension, stable Epigastric pain, resolved  Recheck when necessary or in 6 months

## 2015-01-20 NOTE — Progress Notes (Signed)
Pre visit review using our clinic review tool, if applicable. No additional management support is needed unless otherwise documented below in the visit note. 

## 2015-01-23 NOTE — Telephone Encounter (Signed)
Reached patient and set up appointment for 02/04/15 with Dr Carlean Purl at 10:15AM, will have Rachel Bo mail out NP paperwork Monday. Confirmed address and insurance is [address] [employer].

## 2015-01-23 NOTE — Telephone Encounter (Signed)
Left another voice mail message for her to call us and we will set her up appointment to see Dr Carlean Purl.

## 2015-01-25 ENCOUNTER — Encounter (HOSPITAL_COMMUNITY): Payer: Self-pay | Admitting: Nurse Practitioner

## 2015-01-25 ENCOUNTER — Emergency Department (HOSPITAL_COMMUNITY): Payer: BLUE CROSS/BLUE SHIELD

## 2015-01-25 ENCOUNTER — Emergency Department (HOSPITAL_COMMUNITY)
Admission: EM | Admit: 2015-01-25 | Discharge: 2015-01-26 | Disposition: A | Discharge status: Home / Self Care | Payer: BLUE CROSS/BLUE SHIELD | Attending: Emergency Medicine | Admitting: Emergency Medicine

## 2015-01-25 DIAGNOSIS — K219 Gastro-esophageal reflux disease without esophagitis: Secondary | ICD-10-CM | POA: Insufficient documentation

## 2015-01-25 DIAGNOSIS — D649 Anemia, unspecified: Secondary | ICD-10-CM | POA: Diagnosis not present

## 2015-01-25 DIAGNOSIS — K529 Noninfective gastroenteritis and colitis, unspecified: Secondary | ICD-10-CM

## 2015-01-25 DIAGNOSIS — M8580 Other specified disorders of bone density and structure, unspecified site: Secondary | ICD-10-CM | POA: Diagnosis not present

## 2015-01-25 DIAGNOSIS — Z87440 Personal history of urinary (tract) infections: Secondary | ICD-10-CM | POA: Insufficient documentation

## 2015-01-25 DIAGNOSIS — Z79899 Other long term (current) drug therapy: Secondary | ICD-10-CM | POA: Insufficient documentation

## 2015-01-25 DIAGNOSIS — Z880 Allergy status to penicillin: Secondary | ICD-10-CM | POA: Insufficient documentation

## 2015-01-25 DIAGNOSIS — I10 Essential (primary) hypertension: Secondary | ICD-10-CM | POA: Diagnosis not present

## 2015-01-25 DIAGNOSIS — R1084 Generalized abdominal pain: Secondary | ICD-10-CM | POA: Diagnosis present

## 2015-01-25 DIAGNOSIS — Z8669 Personal history of other diseases of the nervous system and sense organs: Secondary | ICD-10-CM | POA: Diagnosis not present

## 2015-01-25 LAB — CBC WITH DIFFERENTIAL/PLATELET
Basophils Absolute: 0 10*3/uL (ref 0.0–0.1)
Basophils Relative: 0 %
Eosinophils Absolute: 0.1 10*3/uL (ref 0.0–0.7)
Eosinophils Relative: 1 %
HCT: 43.4 % (ref 36.0–46.0)
HEMOGLOBIN: 15 g/dL (ref 12.0–15.0)
LYMPHS ABS: 0.3 10*3/uL — AB (ref 0.7–4.0)
LYMPHS PCT: 5 %
MCH: 31.2 pg (ref 26.0–34.0)
MCHC: 34.6 g/dL (ref 30.0–36.0)
MCV: 90.2 fL (ref 78.0–100.0)
Monocytes Absolute: 0.4 10*3/uL (ref 0.1–1.0)
Monocytes Relative: 7 %
NEUTROS PCT: 87 %
Neutro Abs: 4.5 10*3/uL (ref 1.7–7.7)
Platelets: 153 10*3/uL (ref 150–400)
RBC: 4.81 MIL/uL (ref 3.87–5.11)
RDW: 11.8 % (ref 11.5–15.5)
WBC: 5.2 10*3/uL (ref 4.0–10.5)

## 2015-01-25 LAB — COMPREHENSIVE METABOLIC PANEL
ALT: 23 U/L (ref 14–54)
AST: 31 U/L (ref 15–41)
Albumin: 4.8 g/dL (ref 3.5–5.0)
Alkaline Phosphatase: 54 U/L (ref 38–126)
Anion gap: 8 (ref 5–15)
BILIRUBIN TOTAL: 1.1 mg/dL (ref 0.3–1.2)
BUN: 24 mg/dL — AB (ref 6–20)
CHLORIDE: 106 mmol/L (ref 101–111)
CO2: 25 mmol/L (ref 22–32)
CREATININE: 0.73 mg/dL (ref 0.44–1.00)
Calcium: 9.3 mg/dL (ref 8.9–10.3)
GFR calc Af Amer: >60 mL/min (ref >60)
GLUCOSE: 158 mg/dL — AB (ref 65–99)
Potassium: 3.8 mmol/L (ref 3.5–5.1)
Sodium: 139 mmol/L (ref 135–145)
Total Protein: 7.9 g/dL (ref 6.5–8.1)

## 2015-01-25 LAB — URINE MICROSCOPIC-ADD ON

## 2015-01-25 LAB — URINALYSIS, ROUTINE W REFLEX MICROSCOPIC
Bilirubin Urine: NEGATIVE
GLUCOSE, UA: NEGATIVE mg/dL
Ketones, ur: NEGATIVE mg/dL
LEUKOCYTES UA: NEGATIVE
Nitrite: POSITIVE — AB
PH: 5 (ref 5.0–8.0)
PROTEIN: NEGATIVE mg/dL
Specific Gravity, Urine: 1.023 (ref 1.005–1.030)
Urobilinogen, UA: 0.2 mg/dL (ref 0.0–1.0)

## 2015-01-25 LAB — MAGNESIUM: MAGNESIUM: 1.8 mg/dL (ref 1.7–2.4)

## 2015-01-25 MED ORDER — ONDANSETRON HCL 4 MG/2ML IJ SOLN
4.0000 mg | Freq: Once | INTRAMUSCULAR | Status: AC
  Administered 2015-01-25: 4 mg via INTRAVENOUS
  Filled 2015-01-25: qty 2

## 2015-01-25 MED ORDER — MORPHINE SULFATE (PF) 4 MG/ML IV SOLN
4.0000 mg | Freq: Once | INTRAVENOUS | Status: AC
  Administered 2015-01-25: 4 mg via INTRAVENOUS
  Filled 2015-01-25: qty 1

## 2015-01-25 MED ORDER — LACTATED RINGERS IV BOLUS (SEPSIS)
1000.0000 mL | Freq: Once | INTRAVENOUS | Status: AC
  Administered 2015-01-25: 1000 mL via INTRAVENOUS

## 2015-01-25 NOTE — ED Notes (Signed)
Pt presented from, c/o abdominal pain 8/10, with N/V/D, onset 1700hrs today, states after eating dinner, remarks on eating lunch at church and that she suspects "food poisoning."

## 2015-01-26 ENCOUNTER — Encounter: Payer: Self-pay | Admitting: Internal Medicine

## 2015-01-26 MED ORDER — LOPERAMIDE HCL 2 MG PO CAPS: 2.0000 mg | ORAL_CAPSULE | ORAL | Status: DC | PRN

## 2015-01-26 MED ORDER — ACETAMINOPHEN-CODEINE #3 300-30 MG PO TABS: 1.0000 | ORAL_TABLET | Freq: Four times a day (QID) | ORAL | Status: DC | PRN

## 2015-01-26 MED ORDER — PROMETHAZINE HCL 25 MG PO TABS
25.0000 mg | ORAL_TABLET | Freq: Once | ORAL | Status: AC
  Administered 2015-01-26: 25 mg via ORAL
  Filled 2015-01-26: qty 1

## 2015-01-26 MED ORDER — ONDANSETRON 8 MG PO TBDP: 8.0000 mg | ORAL_TABLET | Freq: Three times a day (TID) | ORAL | Status: DC | PRN

## 2015-01-26 NOTE — ED Provider Notes (Addendum)
CSN: [id number]     Arrival date & time 01/25/15  2106 History   First MD Initiated Contact with Patient 01/25/15 2125     Chief Complaint  Patient presents with  . Abdominal Pain  . N/V/D      (Consider location/radiation/quality/duration/timing/severity/associated sxs/prior Treatment) HPI Comments: Pt with no sig med hx comes in with n/v/diarrhea/abd pain. She started with n/v/diarrhea in the late afternoon, and then came the pain. Pain is diffuse. There is no bloody stools or emesis and no fevers. No travels, no recent sick contacts, antibiotics or admission.   The history is provided by the patient.    Past Medical History  Diagnosis Date  . Hypertension   . Atherosclerosis of other specified arteries   . Venous insufficiency   . Hiatal hernia   . GERD (gastroesophageal reflux disease)   . IBS (irritable bowel syndrome)   . Hemorrhoids   . UTI (lower urinary tract infection)   . Osteopenia   . Low back pain   . Anemia   . Bilateral carpal tunnel syndrome    Past Surgical History  Procedure Laterality Date  . Abdominal hysterectomy    . Bilateral carpal tunnel release  01/2008 and 06/2005    Dr. Daylene Katayama  . Left cataract and lens  07/2008    Dr. Ellie Lunch  . Hemorrhoid sugery  04/2009    Dr. Dalbert Batman  . Breast reduction surgery  1999  . Endovenous ablation saphenous vein w/ laser  12-15-2011    right greater saphenous vein by Curt Jews MD  . Endovenous ablation saphenous vein w/ laser  01-26-2012    left greater saphenous vein by Curt Jews MD   Family History  Problem Relation Age of Onset  . Stroke Mother   . COPD Father    Social History  Substance Use Topics  . Smoking status: Never Smoker   . Smokeless tobacco: Never Used  . Alcohol Use: No   OB History    No data available     Review of Systems  Constitutional: Positive for activity change.  Respiratory: Negative for shortness of breath.   Cardiovascular: Negative for chest pain.  Gastrointestinal:  Positive for nausea, vomiting, abdominal pain and diarrhea.  Genitourinary: Negative for dysuria.  Musculoskeletal: Negative for neck pain.  Neurological: Negative for headaches.      Allergies  Tramadol; Lansoprazole; Sulfamethoxazole-trimethoprim; and Penicillins  Home Medications   Prior to Admission medications   Medication Sig Start Date End Date Taking? Authorizing Jacalynn Buzzell  acetaminophen (TYLENOL) 500 MG tablet Take 1,000 mg by mouth every 8 (eight) hours as needed for headache.    Yes Historical Salote Weidmann, MD  Calcium-Vitamin D-Vitamin K (CALCIUM + D) [phone] MG-UNT-MCG CHEW Chew 1 tablet by mouth daily.   Yes Historical Schneur Crowson, MD  Cholecalciferol (VITAMIN D) 1000 UNITS capsule Take 2,000 Units by mouth daily.    Yes Historical Anjelita Sheahan, MD  folic acid (FOLVITE) 1 MG tablet Take 1 mg by mouth daily.     Yes Historical Bodee Lafoe, MD  KRILL OIL PO Take 1 tablet by mouth daily.   Yes Historical Marianna Cid, MD  losartan (COZAAR) 100 MG tablet TAKE 1 TABLET BY MOUTH EVERY DAY 10/30/14  Yes Marletta Lor, MD  metoprolol (LOPRESSOR) 50 MG tablet TAKE 1 TABLET BY MOUTH TWICE DAILY. 08/07/14  Yes Marletta Lor, MD  Multiple Vitamins-Minerals (MULTIVITAMIN & MINERAL PO) Take 1 tablet by mouth daily.     Yes Historical Izaah Westman, MD  omeprazole (PRILOSEC OTC)  20 MG tablet Take 20 mg by mouth daily.   Yes Historical Babygirl Trager, MD  Probiotic Product (PROBIOTIC DAILY PO) Take 1 tablet by mouth daily.   Yes Historical Phillippe Orlick, MD  valACYclovir (VALTREX) 1000 MG tablet Take 1,000 mg by mouth 2 (two) times daily as needed. For outbreaks 06/03/14  Yes Historical Jobanny Mavis, MD  acetaminophen-codeine (TYLENOL #3) 300-30 MG per tablet Take 1-2 tablets by mouth every 6 (six) hours as needed. 01/26/15   Varney Biles, MD  loperamide (IMODIUM) 2 MG capsule Take 1 capsule (2 mg total) by mouth as needed for diarrhea or loose stools (severe diarrhea, night time diarrhea). 01/26/15   Varney Biles,  MD  ondansetron (ZOFRAN ODT) 8 MG disintegrating tablet Take 1 tablet (8 mg total) by mouth every 8 (eight) hours as needed for nausea. 01/26/15   Ankit Nanavati, MD   BP 152/67 mmHg  Pulse 87  Temp(Src) 98.7 F (37.1 C) (Oral)  Resp 20  SpO2 97% Physical Exam  Constitutional: She is oriented to person, place, and time. She appears well-developed.  HENT:  Head: Normocephalic and atraumatic.  Mouth/Throat: Oropharynx is clear and moist.  Eyes: EOM are normal.  Neck: Normal range of motion. Neck supple.  Cardiovascular: Normal rate.   Pulmonary/Chest: Effort normal.  Abdominal: Bowel sounds are normal. She exhibits no distension and no mass. There is tenderness. There is no rebound and no guarding.  Diffuse tenderness  Neurological: She is alert and oriented to person, place, and time.  Skin: Skin is warm and dry.  Nursing note and vitals reviewed.   ED Course  Procedures (including critical care time) Labs Review Labs Reviewed  CBC WITH DIFFERENTIAL/PLATELET - Abnormal; Notable for the following:    Lymphs Abs 0.3 (*)    All other components within normal limits  URINALYSIS, ROUTINE W REFLEX MICROSCOPIC (NOT AT [facility]) - Abnormal; Notable for the following:    APPearance CLOUDY (*)    Hgb urine dipstick TRACE (*)    Nitrite POSITIVE (*)    All other components within normal limits  COMPREHENSIVE METABOLIC PANEL - Abnormal; Notable for the following:    Glucose, Bld 158 (*)    BUN 24 (*)    All other components within normal limits  URINE MICROSCOPIC-ADD ON - Abnormal; Notable for the following:    Bacteria, UA FEW (*)    All other components within normal limits  MAGNESIUM    Imaging Review No results found. I have personally reviewed and evaluated these images and lab results as part of my medical decision-making.   EKG Interpretation None     @12 :05: Pt feels better. No more emesis, no diarrhea since roomed. Wants to go home. Oral challenge passed. Strict return  precautions discussed. She refused AAS. She denies uti like symptoms- we will culture the urine. No tx for now. MDM   Final diagnoses:  Gastroenteritis    Pt comes in with cc of nausea, emesis, diarrhea. Pt is a healthy woman. Symptoms started abruptly.  She has no peritoneal signs on abd exam and the tenderness is diffuse. She has some dizziness -with ambulation. LAbs are reassuring. No recent travels. No bloody stools, fevers. No recent antibiotics or admission to suggest cdiff. She has hx of hysterectomy.  DDX: SBO, gastroenteritis, dehydration. With stable vitals and non peritoneal abd exam - we will start with aas for imaging.   Varney Biles, MD 01/26/15 4401  Varney Biles, MD 01/26/15 0272  Varney Biles, MD 01/26/15 5366

## 2015-01-26 NOTE — Discharge Instructions (Signed)
Please take the medicine prescribed, hydrate well and see your doctors in 5 days.  Return to the ER if there is any bloody stools, vomiting or fevers. Please hydrate well.   Viral Gastroenteritis Viral gastroenteritis is also known as stomach flu. This condition affects the stomach and intestinal tract. It can cause sudden diarrhea and vomiting. The illness typically lasts 3 to 8 days. Most people develop an immune response that eventually gets rid of the virus. While this natural response develops, the virus can make you quite ill. CAUSES  Many different viruses can cause gastroenteritis, such as rotavirus or noroviruses. You can catch one of these viruses by consuming contaminated food or water. You may also catch a virus by sharing utensils or other personal items with an infected person or by touching a contaminated surface. SYMPTOMS  The most common symptoms are diarrhea and vomiting. These problems can cause a severe loss of body fluids (dehydration) and a body salt (electrolyte) imbalance. Other symptoms may include:  Fever.  Headache.  Fatigue.  Abdominal pain. DIAGNOSIS  Your caregiver can usually diagnose viral gastroenteritis based on your symptoms and a physical exam. A stool sample may also be taken to test for the presence of viruses or other infections. TREATMENT  This illness typically goes away on its own. Treatments are aimed at rehydration. The most serious cases of viral gastroenteritis involve vomiting so severely that you are not able to keep fluids down. In these cases, fluids must be given through an intravenous line (IV). HOME CARE INSTRUCTIONS   Drink enough fluids to keep your urine clear or pale yellow. Drink small amounts of fluids frequently and increase the amounts as tolerated.  Ask your caregiver for specific rehydration instructions.  Avoid:  Foods high in sugar.  Alcohol.  Carbonated drinks.  Tobacco.  Juice.  Caffeine drinks.  Extremely  hot or cold fluids.  Fatty, greasy foods.  Too much intake of anything at one time.  Dairy products until 24 to 48 hours after diarrhea stops.  You may consume probiotics. Probiotics are active cultures of beneficial bacteria. They may lessen the amount and number of diarrheal stools in adults. Probiotics can be found in yogurt with active cultures and in supplements.  Wash your hands well to avoid spreading the virus.  Only take over-the-counter or prescription medicines for pain, discomfort, or fever as directed by your caregiver. Do not give aspirin to children. Antidiarrheal medicines are not recommended.  Ask your caregiver if you should continue to take your regular prescribed and over-the-counter medicines.  Keep all follow-up appointments as directed by your caregiver. SEEK IMMEDIATE MEDICAL CARE IF:   You are unable to keep fluids down.  You do not urinate at least once every 6 to 8 hours.  You develop shortness of breath.  You notice blood in your stool or vomit. This may look like coffee grounds.  You have abdominal pain that increases or is concentrated in one small area (localized).  You have persistent vomiting or diarrhea.  You have a fever.  The patient is a child younger than 3 months, and he or she has a fever.  The patient is a child older than 3 months, and he or she has a fever and persistent symptoms.  The patient is a child older than 3 months, and he or she has a fever and symptoms suddenly get worse.  The patient is a baby, and he or she has no tears when crying. MAKE SURE YOU:  Understand these instructions.  Will watch your condition.  Will get help right away if you are not doing well or get worse. Document Released: 04/18/2005 Document Revised: 07/11/2011 Document Reviewed: 02/02/2011 [facility] Patient Information 2015 [place], [place]. This information is not intended to replace advice given to you by your health care provider. Make sure  you discuss any questions you have with your health care provider. Dehydration, Adult Dehydration is when you lose more fluids from the body than you take in. Vital organs like the kidneys, brain, and heart cannot function without a proper amount of fluids and salt. Any loss of fluids from the body can cause dehydration.  CAUSES   Vomiting.  Diarrhea.  Excessive sweating.  Excessive urine output.  Fever. SYMPTOMS  Mild dehydration  Thirst.  Dry lips.  Slightly dry mouth. Moderate dehydration  Very dry mouth.  Sunken eyes.  Skin does not bounce back quickly when lightly pinched and released.  Dark urine and decreased urine production.  Decreased tear production.  Headache. Severe dehydration  Very dry mouth.  Extreme thirst.  Rapid, weak pulse (more than 100 beats per minute at rest).  Cold hands and feet.  Not able to sweat in spite of heat and temperature.  Rapid breathing.  Blue lips.  Confusion and lethargy.  Difficulty being awakened.  Minimal urine production.  No tears. DIAGNOSIS  Your caregiver will diagnose dehydration based on your symptoms and your exam. Blood and urine tests will help confirm the diagnosis. The diagnostic evaluation should also identify the cause of dehydration. TREATMENT  Treatment of mild or moderate dehydration can often be done at home by increasing the amount of fluids that you drink. It is best to drink small amounts of fluid more often. Drinking too much at one time can make vomiting worse. Refer to the home care instructions below. Severe dehydration needs to be treated at the hospital where you will probably be given intravenous (IV) fluids that contain water and electrolytes. HOME CARE INSTRUCTIONS   Ask your caregiver about specific rehydration instructions.  Drink enough fluids to keep your urine clear or pale yellow.  Drink small amounts frequently if you have nausea and vomiting.  Eat as you normally  do.  Avoid:  Foods or drinks high in sugar.  Carbonated drinks.  Juice.  Extremely hot or cold fluids.  Drinks with caffeine.  Fatty, greasy foods.  Alcohol.  Tobacco.  Overeating.  Gelatin desserts.  Wash your hands well to avoid spreading bacteria and viruses.  Only take over-the-counter or prescription medicines for pain, discomfort, or fever as directed by your caregiver.  Ask your caregiver if you should continue all prescribed and over-the-counter medicines.  Keep all follow-up appointments with your caregiver. SEEK MEDICAL CARE IF:  You have abdominal pain and it increases or stays in one area (localizes).  You have a rash, stiff neck, or severe headache.  You are irritable, sleepy, or difficult to awaken.  You are weak, dizzy, or extremely thirsty. SEEK IMMEDIATE MEDICAL CARE IF:   You are unable to keep fluids down or you get worse despite treatment.  You have frequent episodes of vomiting or diarrhea.  You have blood or green matter (bile) in your vomit.  You have blood in your stool or your stool looks black and tarry.  You have not urinated in 6 to 8 hours, or you have only urinated a small amount of very dark urine.  You have a fever.  You faint. MAKE SURE YOU:  Understand these instructions.  Will watch your condition.  Will get help right away if you are not doing well or get worse. Document Released: 04/18/2005 Document Revised: 07/11/2011 Document Reviewed: 12/06/2010 [facility] Patient Information 2015 [place], [place]. This information is not intended to replace advice given to you by your health care provider. Make sure you discuss any questions you have with your health care provider.

## 2015-01-28 LAB — URINE CULTURE: SPECIAL REQUESTS: NORMAL

## 2015-01-29 ENCOUNTER — Telehealth (HOSPITAL_BASED_OUTPATIENT_CLINIC_OR_DEPARTMENT_OTHER): Payer: Self-pay | Admitting: Emergency Medicine

## 2015-01-29 NOTE — Telephone Encounter (Signed)
Post ED Visit - Positive Culture Follow-up  Culture report reviewed by antimicrobial stewardship pharmacist:  []  Heide Guile, Pharm.D., BCPS []  Alycia Rossetti, Pharm.D., BCPS []  [place], Pharm.D., BCPS, AAHIVP []  Legrand Como, Pharm.D., BCPS, AAHIVP [x]  [place], Pharm.D. []  Milus Glazier, Pharm.D.  Positive urine culture E. coli Treated with none, asymptomatic,  no further patient follow-up is required at this time.  Hazle Nordmann 01/29/2015, 9:49 AM

## 2015-01-30 ENCOUNTER — Other Ambulatory Visit: Payer: Self-pay | Admitting: Internal Medicine

## 2015-02-04 ENCOUNTER — Ambulatory Visit (INDEPENDENT_AMBULATORY_CARE_PROVIDER_SITE_OTHER): Payer: BLUE CROSS/BLUE SHIELD | Admitting: Internal Medicine

## 2015-02-04 ENCOUNTER — Encounter: Payer: Self-pay | Admitting: Internal Medicine

## 2015-02-04 VITALS — BP 138/60 | HR 70 | Ht 61.25 in | Wt 148.8 lb

## 2015-02-04 DIAGNOSIS — R1013 Epigastric pain: Secondary | ICD-10-CM | POA: Diagnosis not present

## 2015-02-04 MED ORDER — ESOMEPRAZOLE MAGNESIUM 40 MG PO CPDR: 40.0000 mg | DELAYED_RELEASE_CAPSULE | Freq: Every day | ORAL | Status: DC

## 2015-02-04 NOTE — Progress Notes (Signed)
   Subjective:    Patient ID: [name], female    DOB: Apr 29, 1951, 64 y.o.   MRN: [id number] Chief complaint: Epigastric discomfort HPI Patient is here for follow-up. She has been having increasing dyspepsia. This was over the summer. More recently she ate a meal at church and then became severely ill with vomiting abdominal pain and diarrhea and actually went to the emergency room by ambulance. That seemed to improve within a couple days later last week or so she was discovered at a UTI and was treated by [facility] or [facility]. The emergency department and suggested she go back on regular Nexium so she did that instead of Prilosec. She has been using some Prilosec instead of Nexium because it is cheaper. Her health insurance premium's are quite expensive also. This causes a fair amount of stress. She does note that she gets an upset stomach and dyspeptic symptoms like nausea and indigestion when she is stressed at work etc.  Medications, allergies, past medical history, past surgical history, family history and social history are reviewed and updated in the EMR.  Review of Systems As above all other review of systems are negative    Objective:   Physical Exam BP 138/60 mmHg  Pulse 70  Ht 5' 1.25" (1.556 m)  Wt 148 lb 12.8 oz (67.495 kg)  BMI 27.88 kg/m2 Abdomen is soft and nontender without no megaly or mass       Assessment & Plan:   1. Dyspepsia    She has chronic functional dyspepsia. In the past she has been worried about cancer. She has had upper endoscopies and nothing inserting or serious found. She will use Nexium she may be located with Prilosec, Nexium of be generic because it was not covered name brand anymore. She will see me as needed. Recent bout of food poisoning seems to be resolved.  15 minutes time spent with patient > half in counseling coordination of care

## 2015-02-04 NOTE — Patient Instructions (Addendum)
You have been given a printed prescription for Nexium to take to the pharmacy at your convenience.  Follow up with Dr. Carlean Purl as needed.    I appreciate the opportunity to care for you. Silvano Rusk, MD, [facility]

## 2015-02-05 ENCOUNTER — Encounter: Payer: Self-pay | Admitting: Internal Medicine

## 2015-05-22 ENCOUNTER — Ambulatory Visit (INDEPENDENT_AMBULATORY_CARE_PROVIDER_SITE_OTHER): Payer: BLUE CROSS/BLUE SHIELD | Admitting: Family Medicine

## 2015-05-22 ENCOUNTER — Encounter: Payer: Self-pay | Admitting: Family Medicine

## 2015-05-22 VITALS — BP 180/62 | HR 71 | Temp 97.9°F | Wt 160.8 lb

## 2015-05-22 DIAGNOSIS — I10 Essential (primary) hypertension: Secondary | ICD-10-CM | POA: Diagnosis not present

## 2015-05-22 MED ORDER — HYDROCHLOROTHIAZIDE 25 MG PO TABS: 25.0000 mg | ORAL_TABLET | Freq: Every day | ORAL | Status: DC

## 2015-05-22 NOTE — Progress Notes (Signed)
   Subjective:    Patient ID: [name], female    DOB: 06-Mar-1951, 65 y.o.   MRN: [id number]  HPI Here for very high BP readings. The last time it was checked was in October in the GI office and it was 138/60. For the past 3 days she has felt lightheaded, dizzy, and has had a mild HA. No sinus congestion or ST or cough. No chest pain or SOB. She checked her BP at home this am and it was 192/98. She walked in here today and her BP was 200/102 and then was later at 180/82. She had labs in September showing normal renal function.    Review of Systems  Constitutional: Negative.   Eyes: Negative.   Respiratory: Negative.   Cardiovascular: Negative.   Neurological: Positive for dizziness, light-headedness and headaches.       Objective:   Physical Exam  Constitutional: She is oriented to person, place, and time. She appears well-developed and well-nourished. No distress.  Neck: No thyromegaly present.  Cardiovascular: Normal rate, regular rhythm, normal heart sounds and intact distal pulses.   Pulmonary/Chest: Effort normal and breath sounds normal.  Musculoskeletal: She exhibits no edema.  Lymphadenopathy:    She has no cervical adenopathy.  Neurological: She is alert and oriented to person, place, and time. No cranial nerve deficit.          Assessment & Plan:  Uncontrolled HTN. We will increase the Metoprolol to 100 mg (2 tabs) bid and add HCTZ 25 mg daily. Recheck in 2 weeks

## 2015-06-05 ENCOUNTER — Encounter: Payer: Self-pay | Admitting: Family Medicine

## 2015-06-05 ENCOUNTER — Ambulatory Visit (INDEPENDENT_AMBULATORY_CARE_PROVIDER_SITE_OTHER): Payer: BLUE CROSS/BLUE SHIELD | Admitting: Family Medicine

## 2015-06-05 VITALS — BP 163/80 | HR 62 | Temp 98.0°F | Ht 61.25 in | Wt 154.0 lb

## 2015-06-05 DIAGNOSIS — I10 Essential (primary) hypertension: Secondary | ICD-10-CM | POA: Diagnosis not present

## 2015-06-05 MED ORDER — AMLODIPINE BESYLATE 5 MG PO TABS: 5.0000 mg | ORAL_TABLET | Freq: Every day | ORAL | Status: DC

## 2015-06-05 NOTE — Progress Notes (Signed)
   Subjective:    Patient ID: [name], female    DOB: March 07, 1951, 65 y.o.   MRN: [id number]  HPI Here to follow up on HTN. After we adjusted her meds a few weeks ago, she feels better and has no more dizziness or headaches. Her systolic BP has come down from the 180s to the 160s.    Review of Systems  Constitutional: Negative.   Respiratory: Negative.   Cardiovascular: Negative.   Neurological: Negative.        Objective:   Physical Exam  Constitutional: She is oriented to person, place, and time. She appears well-developed and well-nourished.  Cardiovascular: Normal rate, regular rhythm, normal heart sounds and intact distal pulses.   Pulmonary/Chest: Effort normal and breath sounds normal.  Neurological: She is alert and oriented to person, place, and time.          Assessment & Plan:  For the HTN we will add Amlodipine 5 mg daily. Recheck in 2-3 weeks

## 2015-06-05 NOTE — Progress Notes (Signed)
Pre visit review using our clinic review tool, if applicable. No additional management support is needed unless otherwise documented below in the visit note. 

## 2015-06-19 ENCOUNTER — Ambulatory Visit (INDEPENDENT_AMBULATORY_CARE_PROVIDER_SITE_OTHER): Payer: BLUE CROSS/BLUE SHIELD | Admitting: Family Medicine

## 2015-06-19 ENCOUNTER — Encounter: Payer: Self-pay | Admitting: Family Medicine

## 2015-06-19 VITALS — BP 140/62 | HR 65 | Temp 97.7°F | Ht 61.25 in | Wt 155.0 lb

## 2015-06-19 DIAGNOSIS — I10 Essential (primary) hypertension: Secondary | ICD-10-CM

## 2015-06-19 MED ORDER — METOPROLOL SUCCINATE ER 100 MG PO TB24: 100.0000 mg | ORAL_TABLET | Freq: Two times a day (BID) | ORAL | Status: DC

## 2015-06-19 MED ORDER — HYDROCHLOROTHIAZIDE 25 MG PO TABS: 25.0000 mg | ORAL_TABLET | Freq: Every day | ORAL | Status: DC

## 2015-06-19 MED ORDER — AMLODIPINE BESYLATE 5 MG PO TABS: 5.0000 mg | ORAL_TABLET | Freq: Every day | ORAL | Status: DC

## 2015-06-19 NOTE — Progress Notes (Signed)
   Subjective:    Patient ID: [name], female    DOB: 05/17/1950, 65 y.o.   MRN: [id number]  HPI Here to recheck her BP after the addition of Amlodipine a few weeks ago. Her BP at home has settled down nicely in the 120s or 130s over 70s or 80s. She feels fine.    Review of Systems  Constitutional: Negative.   Respiratory: Negative.   Cardiovascular: Negative.   Neurological: Negative.        Objective:   Physical Exam  Constitutional: She is oriented to person, place, and time. She appears well-developed and well-nourished.  Neck: No thyromegaly present.  Cardiovascular: Normal rate, regular rhythm, normal heart sounds and intact distal pulses.   Pulmonary/Chest: Effort normal and breath sounds normal.  Lymphadenopathy:    She has no cervical adenopathy.  Neurological: She is alert and oriented to person, place, and time.          Assessment & Plan:  Her HTN is now well controlled. She will return soon for a cpx and fasting labs.

## 2015-06-19 NOTE — Progress Notes (Signed)
Pre visit review using our clinic review tool, if applicable. No additional management support is needed unless otherwise documented below in the visit note. 

## 2015-07-09 ENCOUNTER — Encounter: Payer: Self-pay | Admitting: Podiatry

## 2015-07-09 ENCOUNTER — Ambulatory Visit (INDEPENDENT_AMBULATORY_CARE_PROVIDER_SITE_OTHER): Payer: BLUE CROSS/BLUE SHIELD | Admitting: Podiatry

## 2015-07-09 DIAGNOSIS — M722 Plantar fascial fibromatosis: Secondary | ICD-10-CM | POA: Diagnosis not present

## 2015-07-09 NOTE — Progress Notes (Signed)
She presents today with a chief complaint of painful feet bilaterally. She states that she would like a new pair of orthotics.  Objective: Vital signs stable alert and oriented 3. Pulses are palpable. Neurologic sensorium is intact. Pes planus is noted bilateral. She has mild tenderness on palpation medial calcaneal tubercle bilateral. She also has tenderness on palpation of the lesser metatarsals forefoot bilateral.  Assessment: Pes planus with history of plantar fasciitis.  Plan: She was casted today for a new set of orthotics.

## 2015-07-10 ENCOUNTER — Other Ambulatory Visit (INDEPENDENT_AMBULATORY_CARE_PROVIDER_SITE_OTHER): Payer: BLUE CROSS/BLUE SHIELD

## 2015-07-10 DIAGNOSIS — Z0000 Encounter for general adult medical examination without abnormal findings: Secondary | ICD-10-CM

## 2015-07-10 LAB — POC URINALSYSI DIPSTICK (AUTOMATED)
Bilirubin, UA: NEGATIVE
Glucose, UA: NEGATIVE
Ketones, UA: NEGATIVE
NITRITE UA: NEGATIVE
PH UA: 7
PROTEIN UA: NEGATIVE
Spec Grav, UA: 1.02
UROBILINOGEN UA: 0.2

## 2015-07-10 LAB — CBC WITH DIFFERENTIAL/PLATELET
BASOS ABS: 0 10*3/uL (ref 0.0–0.1)
Basophils Relative: 0.4 % (ref 0.0–3.0)
Eosinophils Absolute: 0.1 10*3/uL (ref 0.0–0.7)
Eosinophils Relative: 3.1 % (ref 0.0–5.0)
HEMATOCRIT: 38.8 % (ref 36.0–46.0)
HEMOGLOBIN: 13.3 g/dL (ref 12.0–15.0)
LYMPHS PCT: 39.9 % (ref 12.0–46.0)
Lymphs Abs: 1.5 10*3/uL (ref 0.7–4.0)
MCHC: 34.2 g/dL (ref 30.0–36.0)
MCV: 91.6 fl (ref 78.0–100.0)
MONOS PCT: 7.5 % (ref 3.0–12.0)
Monocytes Absolute: 0.3 10*3/uL (ref 0.1–1.0)
NEUTROS ABS: 1.8 10*3/uL (ref 1.4–7.7)
Neutrophils Relative %: 49.1 % (ref 43.0–77.0)
PLATELETS: 191 10*3/uL (ref 150.0–400.0)
RBC: 4.23 Mil/uL (ref 3.87–5.11)
RDW: 12.4 % (ref 11.5–15.5)
WBC: 3.7 10*3/uL — AB (ref 4.0–10.5)

## 2015-07-10 LAB — BASIC METABOLIC PANEL
BUN: 18 mg/dL (ref 6–23)
CALCIUM: 9.4 mg/dL (ref 8.4–10.5)
CHLORIDE: 103 meq/L (ref 96–112)
CO2: 33 meq/L — AB (ref 19–32)
Creatinine, Ser: 0.74 mg/dL (ref 0.40–1.20)
GFR: 84.07 mL/min (ref >60.00)
Glucose, Bld: 108 mg/dL — ABNORMAL HIGH (ref 70–99)
Potassium: 3.8 mEq/L (ref 3.5–5.1)
SODIUM: 141 meq/L (ref 135–145)

## 2015-07-10 LAB — HEPATIC FUNCTION PANEL
ALBUMIN: 4.4 g/dL (ref 3.5–5.2)
ALK PHOS: 55 U/L (ref 39–117)
ALT: 17 U/L (ref 0–35)
AST: 21 U/L (ref 0–37)
Bilirubin, Direct: 0.2 mg/dL (ref 0.0–0.3)
TOTAL PROTEIN: 7.3 g/dL (ref 6.0–8.3)
Total Bilirubin: 0.9 mg/dL (ref 0.2–1.2)

## 2015-07-10 LAB — LIPID PANEL
CHOL/HDL RATIO: 4
Cholesterol: 146 mg/dL (ref 0–200)
HDL: 40.3 mg/dL (ref >39.00)
LDL Cholesterol: 72 mg/dL (ref 0–99)
NONHDL: 106.07
Triglycerides: 171 mg/dL — ABNORMAL HIGH (ref 0.0–149.0)
VLDL: 34.2 mg/dL (ref 0.0–40.0)

## 2015-07-10 LAB — TSH: TSH: 1.15 u[IU]/mL (ref 0.35–4.50)

## 2015-07-16 ENCOUNTER — Encounter: Payer: Self-pay | Admitting: Family Medicine

## 2015-07-16 ENCOUNTER — Ambulatory Visit (INDEPENDENT_AMBULATORY_CARE_PROVIDER_SITE_OTHER): Payer: BLUE CROSS/BLUE SHIELD | Admitting: Family Medicine

## 2015-07-16 VITALS — BP 135/66 | HR 61 | Temp 98.0°F | Ht 61.25 in | Wt 155.0 lb

## 2015-07-16 DIAGNOSIS — Z0000 Encounter for general adult medical examination without abnormal findings: Secondary | ICD-10-CM

## 2015-07-16 DIAGNOSIS — R739 Hyperglycemia, unspecified: Secondary | ICD-10-CM | POA: Insufficient documentation

## 2015-07-16 LAB — POCT GLYCOSYLATED HEMOGLOBIN (HGB A1C): HEMOGLOBIN A1C: 5.4

## 2015-07-16 NOTE — Addendum Note (Signed)
Addended by: Aggie Hacker A on: 07/16/2015 12:20 PM   Modules accepted: Orders

## 2015-07-16 NOTE — Progress Notes (Signed)
Pre visit review using our clinic review tool, if applicable. No additional management support is needed unless otherwise documented below in the visit note. 

## 2015-07-16 NOTE — Progress Notes (Signed)
   Subjective:    Patient ID: [name], female    DOB: July 31, 1950, 65 y.o.   MRN: [id number]  HPI 65 yr old female for a cpx. She feels well and has no concerns. We note that her fasting glucose is up to 108, and her sister has type 2 diabetes. We discussed her diet, and she eats rice at least once every day.  Review of Systems  Constitutional: Negative.   HENT: Negative.   Eyes: Negative.   Respiratory: Negative.   Cardiovascular: Negative.   Gastrointestinal: Negative.   Genitourinary: Negative for dysuria, urgency, frequency, hematuria, flank pain, decreased urine volume, enuresis, difficulty urinating, pelvic pain and dyspareunia.  Musculoskeletal: Negative.   Skin: Negative.   Neurological: Negative.   Psychiatric/Behavioral: Negative.        Objective:   Physical Exam  Constitutional: She is oriented to person, place, and time. She appears well-developed and well-nourished. No distress.  HENT:  Head: Normocephalic and atraumatic.  Right Ear: External ear normal.  Left Ear: External ear normal.  Nose: Nose normal.  Mouth/Throat: Oropharynx is clear and moist. No oropharyngeal exudate.  Eyes: Conjunctivae and EOM are normal. Pupils are equal, round, and reactive to light. No scleral icterus.  Neck: Normal range of motion. Neck supple. No JVD present. No thyromegaly present.  Cardiovascular: Normal rate, regular rhythm, normal heart sounds and intact distal pulses.  Exam reveals no gallop and no friction rub.   No murmur heard. EKG normal   Pulmonary/Chest: Effort normal and breath sounds normal. No respiratory distress. She has no wheezes. She has no rales. She exhibits no tenderness.  Abdominal: Soft. Bowel sounds are normal. She exhibits no distension and no mass. There is no tenderness. There is no rebound and no guarding.  Musculoskeletal: Normal range of motion. She exhibits no edema or tenderness.  Lymphadenopathy:    She has no cervical adenopathy.  Neurological:  She is alert and oriented to person, place, and time. She has normal reflexes. No cranial nerve deficit. She exhibits normal muscle tone. Coordination normal.  Skin: Skin is warm and dry. No rash noted. No erythema.  Psychiatric: She has a normal mood and affect. Her behavior is normal. Judgment and thought content normal.          Assessment & Plan:  Well exam. Her baseline A1c today is 5.4%. We discussed diet and exercise. She needs to lose some weight, and she plans to reduce her consumption of rice to once or twice a week only.

## 2015-07-31 ENCOUNTER — Ambulatory Visit: Payer: BLUE CROSS/BLUE SHIELD | Admitting: *Deleted

## 2015-07-31 DIAGNOSIS — M722 Plantar fascial fibromatosis: Secondary | ICD-10-CM

## 2015-07-31 NOTE — Patient Instructions (Signed)

## 2015-07-31 NOTE — Progress Notes (Signed)
Patient ID: [name], female   DOB: [birth year], 65 y.o.   MRN: [id number] Patient presents for orthotic pick up.  Verbal and written break in and wear instructions given.  Patient will follow up in 4 weeks if symptoms worsen or fail to improve.

## 2015-11-26 ENCOUNTER — Encounter: Payer: Self-pay | Admitting: Adult Health

## 2015-11-26 ENCOUNTER — Ambulatory Visit (INDEPENDENT_AMBULATORY_CARE_PROVIDER_SITE_OTHER): Payer: BLUE CROSS/BLUE SHIELD | Admitting: Adult Health

## 2015-11-26 VITALS — BP 120/74 | Temp 98.2°F | Ht 61.25 in | Wt 158.7 lb

## 2015-11-26 DIAGNOSIS — L509 Urticaria, unspecified: Secondary | ICD-10-CM

## 2015-11-26 MED ORDER — METHYLPREDNISOLONE ACETATE 80 MG/ML IJ SUSP
80.0000 mg | Freq: Once | INTRAMUSCULAR | Status: AC
  Administered 2015-11-26: 80 mg via INTRAMUSCULAR

## 2015-11-26 NOTE — Patient Instructions (Signed)
Follow up if no improvement in the hives  Go to the ER with shortness of breath, difficulty breathing or an itchy throat.

## 2015-11-26 NOTE — Progress Notes (Signed)
Subjective:    Patient ID: [name], female    DOB: [birth year], 65 y.o.   MRN: [id number]  Rash  This is a new problem. The current episode started 1 to 4 weeks ago (10 days ago ). The problem has been gradually improving since onset. The affected locations include the left axilla, right axilla, right arm, right elbow, right upper leg and left upper leg. The rash is characterized by itchiness and redness (hives). She was exposed to shellfish (salmon ). Pertinent negatives include no congestion, cough, facial edema, fever, joint pain, shortness of breath or sore throat. Past treatments include antihistamine. The treatment provided mild relief.   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 Recently she has started eating sushi again and had no issues. She went to [facility] about 8 days ago and had salmon. Soon after she started breaking out in hives. This is the same reaction she had to fish prior.   Denies any new soaps, detergents or perfumes.    Review of Systems  Constitutional: Negative for fever.  HENT: Negative for congestion and sore throat.   Respiratory: Negative for cough and shortness of breath.   Musculoskeletal: Negative for joint pain.  Skin: Positive for rash.   Past Medical History:  Diagnosis Date  . Anemia   . Atherosclerosis of other specified arteries   . Bilateral carpal tunnel syndrome   . GERD (gastroesophageal reflux disease)   . Hemorrhoids   . Hiatal hernia   . Hypertension   . IBS (irritable bowel syndrome)   . Low back pain   . Osteopenia   . UTI (lower urinary tract infection)   . Venous insufficiency     Social History   Social History  . Marital status: Married    Spouse name: N/A  . Number of children: 2  . Years of education: N/A   Occupational History  . dry cleaners    Social History Main Topics  . Smoking status: Never Smoker  . Smokeless tobacco: Never Used  .  Alcohol use No  . Drug use: No  . Sexual activity: Not on file   Other Topics Concern  . Not on file   Social History Narrative   Married, originally from [place]   Owns a dry cleaning business   1 son one daughter both adults   02/05/2015       Past Surgical History:  Procedure Laterality Date  . ABDOMINAL HYSTERECTOMY    . bilateral carpal tunnel release  01/2008 and 06/2005   Dr. Daylene Katayama  . BREAST REDUCTION SURGERY  1999  . COLONOSCOPY  06-13-06   per Dr. Carlean Purl, hemorrhoids only, no polyps, repeat in 10 yrs   . ENDOVENOUS ABLATION SAPHENOUS VEIN W/ LASER  12-15-2011   right greater saphenous vein by Curt Jews MD  . ENDOVENOUS ABLATION SAPHENOUS VEIN W/ LASER  01-26-2012   left greater saphenous vein by Curt Jews MD  . ESOPHAGOGASTRODUODENOSCOPY  07-23-09   per Dr. Carlean Purl, gastritis   . hemorrhoid sugery  04/2009   Dr. Dalbert Batman  . left cataract and lens  07/2008   Dr. Ellie Lunch    Family History  Problem Relation Age of Onset  . Stroke Mother   . COPD Father   . Colon cancer Neg Hx     Allergies  Allergen Reactions  . Tramadol Other (See Comments)    Insomnia  .  Lansoprazole     REACTION: headaches  . Sulfamethoxazole-Trimethoprim     Causes SOB and flu like symptoms  . Penicillins Rash    Has patient had a PCN reaction causing immediate rash, facial/tongue/throat swelling, SOB or lightheadedness with hypotension: No Has patient had a PCN reaction causing severe rash involving mucus membranes or skin necrosis: No Has patient had a PCN reaction that required hospitalization No Has patient had a PCN reaction occurring within the last 10 years: No If all of the above answers are "NO", then may proceed with Cephalosporin use.     Current Outpatient Prescriptions on File Prior to Visit  Medication Sig Dispense Refill  . amLODipine (NORVASC) 5 MG tablet Take 1 tablet (5 mg total) by mouth daily. 90 tablet 3  . Calcium-Vitamin D-Vitamin K (CALCIUM + D)  [phone] MG-UNT-MCG CHEW Chew 1 tablet by mouth daily.    . Cholecalciferol (VITAMIN D) 1000 UNITS capsule Take 2,000 Units by mouth daily.     . folic acid (FOLVITE) 1 MG tablet Take 1 mg by mouth daily.      . hydrochlorothiazide (HYDRODIURIL) 25 MG tablet Take 1 tablet (25 mg total) by mouth daily. 90 tablet 3  . KRILL OIL PO Take 1 tablet by mouth daily.    Marland Kitchen losartan (COZAAR) 100 MG tablet TAKE 1 TABLET BY MOUTH EVERY DAY 90 tablet 3  . metoprolol succinate (TOPROL-XL) 100 MG 24 hr tablet Take 1 tablet (100 mg total) by mouth 2 (two) times daily. Take with or immediately following a meal. 180 tablet 3  . Multiple Vitamins-Minerals (MULTIVITAMIN & MINERAL PO) Take 1 tablet by mouth daily. Reported on 07/16/2015    . Probiotic Product (PROBIOTIC DAILY PO) Take 1 tablet by mouth daily.    . valACYclovir (VALTREX) 1000 MG tablet Take 1,000 mg by mouth 2 (two) times daily as needed. For outbreaks  3   No current facility-administered medications on file prior to visit.     BP 120/74   Temp 98.2 F (36.8 C) (Oral)   Ht 5' 1.25" (1.556 m)   Wt 158 lb 11.2 oz (72 kg)   BMI 29.74 kg/m       Objective:   Physical Exam  Constitutional: She is oriented to person, place, and time. She appears well-developed and well-nourished. No distress.  Neurological: She is alert and oriented to person, place, and time.  Skin: Skin is warm and dry. Rash noted. Rash is urticarial. She is not diaphoretic. There is erythema.  Hives in left and right axilla, right arm and bilateral lower extremities.   Psychiatric: She has a normal mood and affect. Her behavior is normal. Judgment and thought content normal.  Nursing note and vitals reviewed.     Assessment & Plan:  1. Hives - methylPREDNISolone acetate (DEPO-MEDROL) injection 80 mg; Inject 1 mL (80 mg total) into the muscle once. - Continue with OTC antihistamine as needed - Follow up if no improvement - go to the Er if symptoms become worse [name]

## 2015-12-14 ENCOUNTER — Telehealth (INDEPENDENT_AMBULATORY_CARE_PROVIDER_SITE_OTHER): Payer: BLUE CROSS/BLUE SHIELD | Admitting: *Deleted

## 2015-12-14 ENCOUNTER — Other Ambulatory Visit: Payer: BLUE CROSS/BLUE SHIELD

## 2015-12-14 DIAGNOSIS — R300 Dysuria: Secondary | ICD-10-CM

## 2015-12-14 LAB — POCT URINALYSIS DIPSTICK
Bilirubin, UA: NEGATIVE
Glucose, UA: NEGATIVE
Ketones, UA: NEGATIVE
Nitrite, UA: NEGATIVE
PH UA: 7.5
PROTEIN UA: NEGATIVE
Spec Grav, UA: 1.015
Urobilinogen, UA: 0.2

## 2015-12-14 MED ORDER — CIPROFLOXACIN HCL 500 MG PO TABS: 500.0000 mg | ORAL_TABLET | Freq: Two times a day (BID) | ORAL | 0 refills | Status: DC

## 2015-12-14 NOTE — Telephone Encounter (Signed)
Patient walked into office today requesting to be triaged due to burning and frequency of urination.  Patient states she has had UTI's in the past and is concerned it may be reoccurring. Patient states symptoms began 2 days ago. She will be leaving for out of town [facility]) tomorrow and would ideally like to have relief during trip. Spoke with Dr. Sarajane Jews and he agreed she may leave a urine specimen and orders will be placed as appropriate. UA performed and results entered in addition to Culture ordered.

## 2015-12-14 NOTE — Telephone Encounter (Signed)
I spoke with pt and sent script for Cipro e-scribe to [facility].

## 2015-12-14 NOTE — Telephone Encounter (Signed)
She does have a UTI. Call in Cipro 500 mg bid for 7 days. A culture is pending.

## 2015-12-16 LAB — URINE CULTURE

## 2016-01-14 ENCOUNTER — Other Ambulatory Visit: Payer: Self-pay | Admitting: Internal Medicine

## 2016-01-14 NOTE — Telephone Encounter (Signed)
Rx refill sent to pharmacy. 

## 2016-02-04 ENCOUNTER — Ambulatory Visit (INDEPENDENT_AMBULATORY_CARE_PROVIDER_SITE_OTHER): Payer: BLUE CROSS/BLUE SHIELD

## 2016-02-04 DIAGNOSIS — Z23 Encounter for immunization: Secondary | ICD-10-CM

## 2016-02-05 ENCOUNTER — Ambulatory Visit: Payer: BLUE CROSS/BLUE SHIELD | Admitting: Family Medicine

## 2016-04-18 ENCOUNTER — Other Ambulatory Visit: Payer: Self-pay | Admitting: Family Medicine

## 2016-06-13 ENCOUNTER — Encounter: Payer: Self-pay | Admitting: Internal Medicine

## 2016-06-17 ENCOUNTER — Other Ambulatory Visit: Payer: Self-pay | Admitting: Family Medicine

## 2016-07-18 ENCOUNTER — Other Ambulatory Visit: Payer: Self-pay | Admitting: Family Medicine

## 2016-08-09 ENCOUNTER — Other Ambulatory Visit: Payer: Self-pay | Admitting: Family Medicine

## 2016-09-27 ENCOUNTER — Other Ambulatory Visit: Payer: Self-pay | Admitting: Family Medicine

## 2016-09-30 ENCOUNTER — Other Ambulatory Visit: Payer: Self-pay | Admitting: Family Medicine

## 2016-09-30 MED ORDER — METOPROLOL SUCCINATE ER 100 MG PO TB24: ORAL_TABLET | ORAL | 0 refills | Status: DC

## 2016-09-30 NOTE — Telephone Encounter (Signed)
Refill request for Metoprolol and send to [facility].

## 2016-09-30 NOTE — Telephone Encounter (Signed)
Pt is on schedule for 10/05/2016 to see Dr. Sarajane Jews. I did send script e-scribe to [facility] and pt is aware of this.

## 2016-10-05 ENCOUNTER — Encounter: Payer: Self-pay | Admitting: Family Medicine

## 2016-10-05 ENCOUNTER — Ambulatory Visit (INDEPENDENT_AMBULATORY_CARE_PROVIDER_SITE_OTHER): Payer: BLUE CROSS/BLUE SHIELD | Admitting: Family Medicine

## 2016-10-05 VITALS — BP 122/76 | HR 61 | Temp 97.8°F | Ht 61.25 in | Wt 157.0 lb

## 2016-10-05 DIAGNOSIS — Z209 Contact with and (suspected) exposure to unspecified communicable disease: Secondary | ICD-10-CM

## 2016-10-05 DIAGNOSIS — Z0000 Encounter for general adult medical examination without abnormal findings: Secondary | ICD-10-CM

## 2016-10-05 LAB — POC URINALSYSI DIPSTICK (AUTOMATED)
Bilirubin, UA: NEGATIVE
Glucose, UA: NEGATIVE
KETONES UA: NEGATIVE
Leukocytes, UA: NEGATIVE
Nitrite, UA: NEGATIVE
PROTEIN UA: NEGATIVE
RBC UA: NEGATIVE
SPEC GRAV UA: 1.015 (ref 1.010–1.025)
UROBILINOGEN UA: 0.2 U/dL
pH, UA: 6 (ref 5.0–8.0)

## 2016-10-05 LAB — BASIC METABOLIC PANEL
BUN: 21 mg/dL (ref 6–23)
CALCIUM: 10.1 mg/dL (ref 8.4–10.5)
CO2: 31 meq/L (ref 19–32)
CREATININE: 0.88 mg/dL (ref 0.40–1.20)
Chloride: 104 mEq/L (ref 96–112)
GFR: 68.56 mL/min (ref >60.00)
GLUCOSE: 99 mg/dL (ref 70–99)
Potassium: 4.4 mEq/L (ref 3.5–5.1)
Sodium: 141 mEq/L (ref 135–145)

## 2016-10-05 LAB — CBC WITH DIFFERENTIAL/PLATELET
BASOS PCT: 0.8 % (ref 0.0–3.0)
Basophils Absolute: 0 10*3/uL (ref 0.0–0.1)
EOS PCT: 1.8 % (ref 0.0–5.0)
Eosinophils Absolute: 0.1 10*3/uL (ref 0.0–0.7)
HCT: 39.2 % (ref 36.0–46.0)
Hemoglobin: 13.6 g/dL (ref 12.0–15.0)
Lymphocytes Relative: 39.8 % (ref 12.0–46.0)
Lymphs Abs: 1.6 10*3/uL (ref 0.7–4.0)
MCHC: 34.6 g/dL (ref 30.0–36.0)
MCV: 90.5 fl (ref 78.0–100.0)
MONO ABS: 0.3 10*3/uL (ref 0.1–1.0)
Monocytes Relative: 8.3 % (ref 3.0–12.0)
NEUTROS ABS: 2 10*3/uL (ref 1.4–7.7)
NEUTROS PCT: 49.3 % (ref 43.0–77.0)
PLATELETS: 207 10*3/uL (ref 150.0–400.0)
RBC: 4.33 Mil/uL (ref 3.87–5.11)
RDW: 12.5 % (ref 11.5–15.5)
WBC: 4.1 10*3/uL (ref 4.0–10.5)

## 2016-10-05 LAB — LIPID PANEL
CHOLESTEROL: 172 mg/dL (ref 0–200)
HDL: 42.5 mg/dL (ref >39.00)
LDL Cholesterol: 95 mg/dL (ref 0–99)
NonHDL: 129.6
TRIGLYCERIDES: 171 mg/dL — AB (ref 0.0–149.0)
Total CHOL/HDL Ratio: 4
VLDL: 34.2 mg/dL (ref 0.0–40.0)

## 2016-10-05 LAB — HEPATIC FUNCTION PANEL
ALBUMIN: 4.7 g/dL (ref 3.5–5.2)
ALT: 26 U/L (ref 0–35)
AST: 26 U/L (ref 0–37)
Alkaline Phosphatase: 50 U/L (ref 39–117)
BILIRUBIN DIRECT: 0.2 mg/dL (ref 0.0–0.3)
TOTAL PROTEIN: 7.5 g/dL (ref 6.0–8.3)
Total Bilirubin: 0.9 mg/dL (ref 0.2–1.2)

## 2016-10-05 LAB — TSH: TSH: 1.1 u[IU]/mL (ref 0.35–4.50)

## 2016-10-05 MED ORDER — METOPROLOL SUCCINATE ER 100 MG PO TB24: ORAL_TABLET | ORAL | 2 refills | Status: DC

## 2016-10-05 MED ORDER — LOSARTAN POTASSIUM-HCTZ 100-25 MG PO TABS: 1.0000 | ORAL_TABLET | Freq: Every day | ORAL | 3 refills | Status: DC

## 2016-10-05 MED ORDER — AMLODIPINE BESYLATE 5 MG PO TABS: ORAL_TABLET | ORAL | 2 refills | Status: DC

## 2016-10-05 NOTE — Patient Instructions (Signed)
WE NOW OFFER   [facility] FAST TRACK!!!  SAME DAY Appointments for ACUTE CARE  Such as: Sprains, Injuries, cuts, abrasions, rashes, muscle pain, joint pain, back pain Colds, flu, sore throats, headache, allergies, cough, fever  Ear pain, sinus and eye infections Abdominal pain, nausea, vomiting, diarrhea, upset stomach Animal/insect bites  3 Easy Ways to Schedule: Walk-In Scheduling Call in scheduling Mychart Sign-up: [email or web address]         

## 2016-10-05 NOTE — Progress Notes (Signed)
   Subjective:    Patient ID: [name], female    DOB: 12-06-1950, 66 y.o.   MRN: [id number]  HPI 66 yr old female for a well exam. She feels great. Her BP has been stable. She is past due for a colonoscopy, but she and Dr. Carlean Purl plan to get this done in July after she goes on Medicare.    Review of Systems  Constitutional: Negative.   HENT: Negative.   Eyes: Negative.   Respiratory: Negative.   Cardiovascular: Negative.   Gastrointestinal: Negative.   Genitourinary: Negative for decreased urine volume, difficulty urinating, dyspareunia, dysuria, enuresis, flank pain, frequency, hematuria, pelvic pain and urgency.  Musculoskeletal: Negative.   Skin: Negative.   Neurological: Negative.   Psychiatric/Behavioral: Negative.        Objective:   Physical Exam  Constitutional: She is oriented to person, place, and time. She appears well-developed and well-nourished. No distress.  HENT:  Head: Normocephalic and atraumatic.  Right Ear: External ear normal.  Left Ear: External ear normal.  Nose: Nose normal.  Mouth/Throat: Oropharynx is clear and moist. No oropharyngeal exudate.  Eyes: Conjunctivae and EOM are normal. Pupils are equal, round, and reactive to light. No scleral icterus.  Neck: Normal range of motion. Neck supple. No JVD present. No thyromegaly present.  Cardiovascular: Normal rate, regular rhythm, normal heart sounds and intact distal pulses.  Exam reveals no gallop and no friction rub.   No murmur heard. Pulmonary/Chest: Effort normal and breath sounds normal. No respiratory distress. She has no wheezes. She has no rales. She exhibits no tenderness.  Abdominal: Soft. Bowel sounds are normal. She exhibits no distension and no mass. There is no tenderness. There is no rebound and no guarding.  Musculoskeletal: Normal range of motion. She exhibits no edema or tenderness.  Lymphadenopathy:    She has no cervical adenopathy.  Neurological: She is alert and oriented to  person, place, and time. She has normal reflexes. No cranial nerve deficit. She exhibits normal muscle tone. Coordination normal.  Skin: Skin is warm and dry. No rash noted. No erythema.  Psychiatric: She has a normal mood and affect. Her behavior is normal. Judgment and thought content normal.          Assessment & Plan:  Well exam. We discussed diet and exercise. Get fasting labs.  Alysia Penna, MD

## 2016-10-06 LAB — HEPATITIS C ANTIBODY: HCV AB: NEGATIVE

## 2016-10-07 ENCOUNTER — Telehealth: Payer: Self-pay | Admitting: Family Medicine

## 2016-10-07 NOTE — Telephone Encounter (Signed)
Pt is returning sylvia concerning blood work

## 2016-10-07 NOTE — Telephone Encounter (Signed)
I spoke with pt and went over results. 

## 2016-10-20 ENCOUNTER — Encounter: Payer: Self-pay | Admitting: Internal Medicine

## 2016-10-20 ENCOUNTER — Telehealth: Payer: Self-pay | Admitting: Internal Medicine

## 2016-10-20 NOTE — Telephone Encounter (Signed)
EGD and Colon scheduled.

## 2016-10-20 NOTE — Telephone Encounter (Signed)
Spoke to her  Having epigastric pain again  Please contact her and schedule EGD/colonoscopy direct  EGD dx is epigastric pain  Colonoscopy is screening

## 2016-10-31 ENCOUNTER — Telehealth: Payer: Self-pay | Admitting: Family Medicine

## 2016-10-31 NOTE — Telephone Encounter (Addendum)
Patient needs [facility] to be the listed pharmacy on her chart.  Patient need a refill for Metoprolol ER Succinate 100 mg tabs called in to [facility] ([place]) just for this month.  Pt only has 1 tab left.  Future months needs to be sent to [facility].

## 2016-11-01 MED ORDER — METOPROLOL SUCCINATE ER 100 MG PO TB24: ORAL_TABLET | ORAL | 0 refills | Status: DC

## 2016-11-01 NOTE — Telephone Encounter (Signed)
Rx sent to [facility] for a month, and [facility] added.

## 2016-11-03 ENCOUNTER — Other Ambulatory Visit: Payer: Self-pay | Admitting: Family Medicine

## 2016-11-17 ENCOUNTER — Telehealth: Payer: Self-pay | Admitting: Family Medicine

## 2016-11-17 NOTE — Telephone Encounter (Signed)
Patient needs a 3 month supply of the following medications sent to [facility]:  Amlodipine Losartan Metoprolol

## 2016-11-21 MED ORDER — AMLODIPINE BESYLATE 5 MG PO TABS: ORAL_TABLET | ORAL | 3 refills | Status: DC

## 2016-11-21 MED ORDER — METOPROLOL SUCCINATE ER 100 MG PO TB24: ORAL_TABLET | ORAL | 3 refills | Status: DC

## 2016-11-21 MED ORDER — LOSARTAN POTASSIUM-HCTZ 100-25 MG PO TABS: 1.0000 | ORAL_TABLET | Freq: Every day | ORAL | 3 refills | Status: DC

## 2016-11-21 NOTE — Telephone Encounter (Signed)
I sent all 3 scripts e-scribe to [facility]. Can you confirm pt is taking Metoprolol XL 100 mg twice a day?

## 2016-11-21 NOTE — Telephone Encounter (Signed)
Yes these meds are correct

## 2016-11-23 DIAGNOSIS — M67441 Ganglion, right hand: Secondary | ICD-10-CM | POA: Diagnosis not present

## 2016-12-15 ENCOUNTER — Encounter: Payer: Self-pay | Admitting: Internal Medicine

## 2016-12-15 ENCOUNTER — Ambulatory Visit (AMBULATORY_SURGERY_CENTER): Payer: Self-pay

## 2016-12-15 VITALS — Ht 62.0 in | Wt 159.2 lb

## 2016-12-15 DIAGNOSIS — Z1211 Encounter for screening for malignant neoplasm of colon: Secondary | ICD-10-CM

## 2016-12-15 DIAGNOSIS — G8929 Other chronic pain: Secondary | ICD-10-CM

## 2016-12-15 DIAGNOSIS — R1013 Epigastric pain: Secondary | ICD-10-CM

## 2016-12-15 NOTE — Progress Notes (Signed)
Denies allergies to eggs or soy products. Denies complication of anesthesia or sedation. Denies use of weight loss medication. Denies use of O2.   Emmi instructions declined.  

## 2016-12-29 ENCOUNTER — Encounter: Payer: Self-pay | Admitting: Internal Medicine

## 2016-12-29 ENCOUNTER — Ambulatory Visit (AMBULATORY_SURGERY_CENTER): Payer: Medicare Other | Admitting: Internal Medicine

## 2016-12-29 VITALS — BP 115/52 | HR 54 | Temp 98.0°F | Resp 18 | Ht 62.0 in | Wt 159.0 lb

## 2016-12-29 DIAGNOSIS — Z1211 Encounter for screening for malignant neoplasm of colon: Secondary | ICD-10-CM

## 2016-12-29 DIAGNOSIS — K635 Polyp of colon: Secondary | ICD-10-CM

## 2016-12-29 DIAGNOSIS — K2970 Gastritis, unspecified, without bleeding: Secondary | ICD-10-CM

## 2016-12-29 DIAGNOSIS — D125 Benign neoplasm of sigmoid colon: Secondary | ICD-10-CM | POA: Diagnosis not present

## 2016-12-29 DIAGNOSIS — K2990 Gastroduodenitis, unspecified, without bleeding: Secondary | ICD-10-CM

## 2016-12-29 DIAGNOSIS — R1013 Epigastric pain: Secondary | ICD-10-CM

## 2016-12-29 DIAGNOSIS — Z1212 Encounter for screening for malignant neoplasm of rectum: Secondary | ICD-10-CM | POA: Diagnosis not present

## 2016-12-29 HISTORY — PX: COLONOSCOPY: SHX174

## 2016-12-29 HISTORY — PX: ESOPHAGOGASTRODUODENOSCOPY: SHX1529

## 2016-12-29 MED ORDER — SODIUM CHLORIDE 0.9 % IV SOLN: 500.0000 mL | INTRAVENOUS | Status: DC

## 2016-12-29 NOTE — Progress Notes (Signed)
Per Dr. Carlean Purl ok to resume Prilosec 20mg  every day.

## 2016-12-29 NOTE — Op Note (Signed)
[facility] Patient Name: [name] Procedure Date: 12/29/2016 9:21 AM MRN: [id number] Endoscopist: Gatha Mayer , MD Age: 66 Referring MD:  Date of Birth: 10/19/1950 Gender: Female Account #: [id number] Procedure:                Colonoscopy Indications:              Screening for colorectal malignant neoplasm, Last                            colonoscopy: 2008 Medicines:                Propofol per Anesthesia, Monitored Anesthesia Care Procedure:                Pre-Anesthesia Assessment:                           - Prior to the procedure, a History and Physical                            was performed, and patient medications and                            allergies were reviewed. The patient's tolerance of                            previous anesthesia was also reviewed. The risks                            and benefits of the procedure and the sedation                            options and risks were discussed with the patient.                            All questions were answered, and informed consent                            was obtained. Prior Anticoagulants: The patient has                            taken no previous anticoagulant or antiplatelet                            agents. ASA Grade Assessment: II - A patient with                            mild systemic disease. After reviewing the risks                            and benefits, the patient was deemed in                            satisfactory condition to undergo the procedure.  After obtaining informed consent, the colonoscope                            was passed under direct vision. Throughout the                            procedure, the patient's blood pressure, pulse, and                            oxygen saturations were monitored continuously. The                            Colonoscope was introduced through the anus and                            advanced to the the  cecum, identified by                            appendiceal orifice and ileocecal valve. The                            colonoscopy was performed without difficulty. The                            patient tolerated the procedure well. The quality                            of the bowel preparation was excellent. The bowel                            preparation used was Miralax. The ileocecal valve,                            appendiceal orifice, and rectum were photographed. Scope In: 9:35:40 AM Scope Out: 9:48:42 AM Scope Withdrawal Time: 0 hours 10 minutes 35 seconds  Total Procedure Duration: 0 hours 13 minutes 2 seconds  Findings:                 The perianal and digital rectal examinations were                            normal.                           A 2 mm polyp was found in the sigmoid colon. The                            polyp was sessile. The polyp was removed with a                            cold biopsy forceps. Resection and retrieval were                            complete. Verification of patient identification  for the specimen was done. Estimated blood loss was                            minimal.                           Anal papilla(e) were hypertrophied.                           The exam was otherwise without abnormality on                            direct and retroflexion views. Complications:            No immediate complications. Estimated Blood Loss:     Estimated blood loss was minimal. Impression:               - One 2 mm polyp in the sigmoid colon, removed with                            a cold biopsy forceps. Resected and retrieved.                           - Anal papilla(e) were hypertrophied.                           - The examination was otherwise normal on direct                            and retroflexion views. Recommendation:           - Patient has a contact number available for                             emergencies. The signs and symptoms of potential                            delayed complications were discussed with the                            patient. Return to normal activities tomorrow.                            Written discharge instructions were provided to the                            patient.                           - Resume previous diet.                           - Continue present medications.                           - Repeat colonoscopy is recommended. The  colonoscopy date will be determined after pathology                            results from today's exam become available for                            review. Gatha Mayer, MD 12/29/2016 9:58:57 AM This report has been signed electronically.

## 2016-12-29 NOTE — Op Note (Signed)
[facility] Patient Name: [name] Procedure Date: 12/29/2016 9:21 AM MRN: [id number] Endoscopist: Gatha Mayer , MD Age: 66 Referring MD:  Date of Birth: November 06, 1950 Gender: Female Account #: [id number] Procedure:                Upper GI endoscopy Indications:              Epigastric abdominal pain Medicines:                Propofol per Anesthesia, Monitored Anesthesia Care Procedure:                Pre-Anesthesia Assessment:                           - Prior to the procedure, a History and Physical                            was performed, and patient medications and                            allergies were reviewed. The patient's tolerance of                            previous anesthesia was also reviewed. The risks                            and benefits of the procedure and the sedation                            options and risks were discussed with the patient.                            All questions were answered, and informed consent                            was obtained. Prior Anticoagulants: The patient has                            taken no previous anticoagulant or antiplatelet                            agents. ASA Grade Assessment: II - A patient with                            mild systemic disease. After reviewing the risks                            and benefits, the patient was deemed in                            satisfactory condition to undergo the procedure.                           After obtaining informed consent, the endoscope was  passed under direct vision. Throughout the                            procedure, the patient's blood pressure, pulse, and                            oxygen saturations were monitored continuously. The                            Endoscope was introduced through the mouth, and                            advanced to the second part of duodenum. The upper                            GI  endoscopy was accomplished without difficulty.                            The patient tolerated the procedure well. Scope In: Scope Out: Findings:                 Diffuse mild inflammation characterized by erythema                            and granularity was found in the gastric antrum.                           The exam was otherwise without abnormality. Complications:            No immediate complications. Estimated Blood Loss:     Estimated blood loss: none. Impression:               - Chronic gastritis.                           - The examination was otherwise normal.                           - No specimens collected. Recommendation:           - Patient has a contact number available for                            emergencies. The signs and symptoms of potential                            delayed complications were discussed with the                            patient. Return to normal activities tomorrow.                            Written discharge instructions were provided to the                            patient.                           -  Resume previous diet.                           - Continue present medications.                           - See the other procedure note for documentation of                            additional recommendations. Gatha Mayer, MD 12/29/2016 9:56:33 AM This report has been signed electronically.

## 2016-12-29 NOTE — Patient Instructions (Addendum)
The stomach looks the same. Mild gastritis.  There was a tiny colon polyp that I removed. I will let you know pathology results and when to have another routine colonoscopy by mail and/or My Chart.  I appreciate the opportunity to care for you. Gatha Mayer, MD, [facility]   Discharge instructions given. Handouts on polyps and Gastritis. Resume previous medications. YOU HAD AN ENDOSCOPIC PROCEDURE TODAY AT [facility]:   Refer to the procedure report that was given to you for any specific questions about what was found during the examination.  If the procedure report does not answer your questions, please call your gastroenterologist to clarify.  If you requested that your care partner not be given the details of your procedure findings, then the procedure report has been included in a sealed envelope for you to review at your convenience later.  YOU SHOULD EXPECT: Some feelings of bloating in the abdomen. Passage of more gas than usual.  Walking can help get rid of the air that was put into your GI tract during the procedure and reduce the bloating. If you had a lower endoscopy (such as a colonoscopy or flexible sigmoidoscopy) you may notice spotting of blood in your stool or on the toilet paper. If you underwent a bowel prep for your procedure, you may not have a normal bowel movement for a few days.  Please Note:  You might notice some irritation and congestion in your nose or some drainage.  This is from the oxygen used during your procedure.  There is no need for concern and it should clear up in a day or so.  SYMPTOMS TO REPORT IMMEDIATELY:   Following lower endoscopy (colonoscopy or flexible sigmoidoscopy):  Excessive amounts of blood in the stool  Significant tenderness or worsening of abdominal pains  Swelling of the abdomen that is new, acute  Fever of 100F or higher   Following upper endoscopy (EGD)  Vomiting of blood or coffee ground material  New  chest pain or pain under the shoulder blades  Painful or persistently difficult swallowing  New shortness of breath  Fever of 100F or higher  Black, tarry-looking stools  For urgent or emergent issues, a gastroenterologist can be reached at any hour by calling [phone].   DIET:  We do recommend a small meal at first, but then you may proceed to your regular diet.  Drink plenty of fluids but you should avoid alcoholic beverages for 24 hours.  ACTIVITY:  You should plan to take it easy for the rest of today and you should NOT DRIVE or use heavy machinery until tomorrow (because of the sedation medicines used during the test).    FOLLOW UP: Our staff will call the number listed on your records the next business day following your procedure to check on you and address any questions or concerns that you may have regarding the information given to you following your procedure. If we do not reach you, we will leave a message.  However, if you are feeling well and you are not experiencing any problems, there is no need to return our call.  We will assume that you have returned to your regular daily activities without incident.  If any biopsies were taken you will be contacted by phone or by letter within the next 1-3 weeks.  Please call us at [phone] if you have not heard about the biopsies in 3 weeks.    SIGNATURES/CONFIDENTIALITY: You and/or your care  partner have signed paperwork which will be entered into your electronic medical record.  These signatures attest to the fact that that the information above on your After Visit Summary has been reviewed and is understood.  Full responsibility of the confidentiality of this discharge information lies with you and/or your care-partner.

## 2016-12-29 NOTE — Progress Notes (Signed)
Pt's states no medical or surgical changes since previsit or office visit. 

## 2016-12-29 NOTE — Progress Notes (Signed)
Report given to PACU, vss 

## 2016-12-29 NOTE — Progress Notes (Signed)
09:23  Present in the procedure room. Maw  Present in room for time out and also assisted with pathology.  maw

## 2016-12-30 ENCOUNTER — Telehealth: Payer: Self-pay | Admitting: *Deleted

## 2016-12-30 NOTE — Telephone Encounter (Signed)
  Follow up Call-  Call back number 12/29/2016  Post procedure Call Back phone  # [phone]  Permission to leave phone message Yes  Some recent data might be hidden     Patient questions:  Do you have a fever, pain , or abdominal swelling? No. Pain Score  0 *  Have you tolerated food without any problems? Yes.    Have you been able to return to your normal activities? Yes.    Do you have any questions about your discharge instructions: Diet   No. Medications  No. Follow up visit  No.  Do you have questions or concerns about your Care? No.  Actions: * If pain score is 4 or above: No action needed, pain <4.

## 2017-01-03 ENCOUNTER — Encounter: Payer: Self-pay | Admitting: Internal Medicine

## 2017-01-03 NOTE — Progress Notes (Signed)
Distal hyperplastic polyp Recall 2028

## 2017-01-25 ENCOUNTER — Ambulatory Visit (INDEPENDENT_AMBULATORY_CARE_PROVIDER_SITE_OTHER): Payer: Medicare Other | Admitting: *Deleted

## 2017-01-25 DIAGNOSIS — Z23 Encounter for immunization: Secondary | ICD-10-CM

## 2017-03-10 DIAGNOSIS — M67442 Ganglion, left hand: Secondary | ICD-10-CM | POA: Diagnosis not present

## 2017-03-10 DIAGNOSIS — M79645 Pain in left finger(s): Secondary | ICD-10-CM | POA: Diagnosis not present

## 2017-03-10 DIAGNOSIS — M67441 Ganglion, right hand: Secondary | ICD-10-CM | POA: Diagnosis not present

## 2017-04-05 ENCOUNTER — Telehealth: Payer: Self-pay | Admitting: Internal Medicine

## 2017-04-05 NOTE — Telephone Encounter (Signed)
Patient requesting dicyclomine for abdomain; cramping?  You performed endo/colon on her in August

## 2017-04-05 NOTE — Telephone Encounter (Signed)
Patient states she is having stomach cramps and wants to know if Dr.Gessner can prescribe her medication dicylclomine.

## 2017-04-06 DIAGNOSIS — L905 Scar conditions and fibrosis of skin: Secondary | ICD-10-CM | POA: Diagnosis not present

## 2017-04-06 DIAGNOSIS — L821 Other seborrheic keratosis: Secondary | ICD-10-CM | POA: Diagnosis not present

## 2017-04-06 DIAGNOSIS — L820 Inflamed seborrheic keratosis: Secondary | ICD-10-CM | POA: Diagnosis not present

## 2017-04-06 DIAGNOSIS — M7130 Other bursal cyst, unspecified site: Secondary | ICD-10-CM | POA: Diagnosis not present

## 2017-04-06 MED ORDER — DICYCLOMINE HCL 20 MG PO TABS: 20.0000 mg | ORAL_TABLET | Freq: Four times a day (QID) | ORAL | 3 refills | Status: DC

## 2017-04-06 NOTE — Telephone Encounter (Signed)
Patient notified and rx sent. 

## 2017-04-06 NOTE — Telephone Encounter (Signed)
Dicyclomine 20 mg q 6 hrs prn abdominal pain  # 120 3 refills

## 2017-04-14 ENCOUNTER — Telehealth: Payer: Self-pay | Admitting: Internal Medicine

## 2017-04-14 ENCOUNTER — Encounter: Payer: Self-pay | Admitting: Internal Medicine

## 2017-04-14 MED ORDER — ONDANSETRON HCL 4 MG PO TABS: 4.0000 mg | ORAL_TABLET | Freq: Three times a day (TID) | ORAL | 0 refills | Status: DC | PRN

## 2017-04-14 NOTE — Telephone Encounter (Signed)
Spoke to her  Having cramps, sore abdomen and now some watery diarrhea - cramps started off/on early dec - and nauseous  Dicyclomine not helping  No fever No Abx lately No sick contacts Was in [facility] last month - no suspicion of foodborne illness  Will try BRAT diet Fluids Ondansetron 4 mg q 8 prn  If not any better by Monday call back

## 2017-04-14 NOTE — Telephone Encounter (Signed)
[name] called me back and she said she had a similar spell several weeks ago. Now  Nausea, abdominal area tender to touch "everywhere", no fever.  She doen't think the bentyl is causing her problems.  She is taking it to help.  She has already taken 2 doses today of the 20mg  Bentyl.  I told her Dr Carlean Purl is out of the office this afternoon.  I will route to him but she is aware if she gets worse to call back or go to the nearest ED.

## 2017-04-14 NOTE — Telephone Encounter (Signed)
Left patient message to call me back so I can see if she is having any other symptoms. In the message I told her to not take any more Bentyl since it is causing these problems.  Will route to Dr Carlean Purl.

## 2017-04-19 DIAGNOSIS — M79641 Pain in right hand: Secondary | ICD-10-CM | POA: Diagnosis not present

## 2017-04-19 DIAGNOSIS — N390 Urinary tract infection, site not specified: Secondary | ICD-10-CM | POA: Diagnosis not present

## 2017-04-19 DIAGNOSIS — M67442 Ganglion, left hand: Secondary | ICD-10-CM | POA: Diagnosis not present

## 2017-04-19 DIAGNOSIS — Z124 Encounter for screening for malignant neoplasm of cervix: Secondary | ICD-10-CM | POA: Diagnosis not present

## 2017-04-19 DIAGNOSIS — R300 Dysuria: Secondary | ICD-10-CM | POA: Diagnosis not present

## 2017-04-19 DIAGNOSIS — M67441 Ganglion, right hand: Secondary | ICD-10-CM | POA: Diagnosis not present

## 2017-04-19 DIAGNOSIS — M79642 Pain in left hand: Secondary | ICD-10-CM | POA: Diagnosis not present

## 2017-05-04 DIAGNOSIS — N390 Urinary tract infection, site not specified: Secondary | ICD-10-CM | POA: Diagnosis not present

## 2017-05-08 DIAGNOSIS — Z1231 Encounter for screening mammogram for malignant neoplasm of breast: Secondary | ICD-10-CM | POA: Diagnosis not present

## 2017-05-10 DIAGNOSIS — M8589 Other specified disorders of bone density and structure, multiple sites: Secondary | ICD-10-CM | POA: Diagnosis not present

## 2017-05-30 ENCOUNTER — Telehealth: Payer: Self-pay

## 2017-05-30 NOTE — Telephone Encounter (Signed)
Last OV 10/05/2016. Rx

## 2017-05-30 NOTE — Telephone Encounter (Signed)
Last OV 10/05/2016  Pt was requesting anything specifically just something to help her sleep during the long flight.   Sent to PCP

## 2017-05-30 NOTE — Telephone Encounter (Signed)
Pt came to the office asking for an Rx to help her sleep on a flight to [place]  Pt will be leaving Feb 2

## 2017-05-31 MED ORDER — ZOLPIDEM TARTRATE 10 MG PO TABS: ORAL_TABLET | ORAL | 0 refills | Status: DC

## 2017-05-31 NOTE — Telephone Encounter (Signed)
Called and spoke with pt. Pt advised and voiced understanding.  

## 2017-05-31 NOTE — Telephone Encounter (Signed)
Call in Ambien 10 mg to take as needed for plane flights, #10 with no rf

## 2017-05-31 NOTE — Telephone Encounter (Signed)
Pt advised pt wanted to know will she need any vaccination before her trip pt leave Feb 11?  If so let her know so she makes an OV   Sent to PCP to advise.

## 2017-05-31 NOTE — Telephone Encounter (Signed)
No special immunizations are needed

## 2017-06-22 ENCOUNTER — Ambulatory Visit (INDEPENDENT_AMBULATORY_CARE_PROVIDER_SITE_OTHER): Payer: Medicare Other | Admitting: Internal Medicine

## 2017-06-22 ENCOUNTER — Encounter: Payer: Self-pay | Admitting: Internal Medicine

## 2017-06-22 VITALS — BP 138/58 | HR 66 | Temp 98.4°F | Ht 62.0 in | Wt 155.0 lb

## 2017-06-22 DIAGNOSIS — B9789 Other viral agents as the cause of diseases classified elsewhere: Secondary | ICD-10-CM | POA: Diagnosis not present

## 2017-06-22 DIAGNOSIS — J069 Acute upper respiratory infection, unspecified: Secondary | ICD-10-CM | POA: Diagnosis not present

## 2017-06-22 MED ORDER — HYDROCODONE-HOMATROPINE 5-1.5 MG/5ML PO SYRP: 5.0000 mL | ORAL_SOLUTION | Freq: Four times a day (QID) | ORAL | 0 refills | Status: DC | PRN

## 2017-06-22 NOTE — Progress Notes (Signed)
Subjective:    Patient ID: [name], female    DOB: Dec 30, 1950, 67 y.o.   MRN: [id number]  HPI  67 year old patient who returns from a mission trip to [place] earlier this morning with chief complaints of refractory cough.  She states that most members on a mission trip developed an acute illness.  Denies any significant fever or sputum production.  Earlier she had a sore throat that has resolved. She is requesting medication for the refractory cough.  She does have a sensitivity to tramadol but has done well on hydrocodone in the past.  She states that she has a penicillin and sulfa allergy  Past Medical History:  Diagnosis Date  . Anemia   . Arthritis   . Atherosclerosis of other specified arteries   . Bilateral carpal tunnel syndrome   . Cataract   . GERD (gastroesophageal reflux disease)   . Hemorrhoids   . Hiatal hernia   . Hypertension   . IBS (irritable bowel syndrome)   . Low back pain   . Osteopenia   . UTI (lower urinary tract infection)   . Venous insufficiency      Social History   Socioeconomic History  . Marital status: Married    Spouse name: Not on file  . Number of children: 2  . Years of education: Not on file  . Highest education level: Not on file  Social Needs  . Financial resource strain: Not on file  . Food insecurity - worry: Not on file  . Food insecurity - inability: Not on file  . Transportation needs - medical: Not on file  . Transportation needs - non-medical: Not on file  Occupational History  . Occupation: dry cleaners  Tobacco Use  . Smoking status: Never Smoker  . Smokeless tobacco: Never Used  Substance and Sexual Activity  . Alcohol use: No    Alcohol/week: 0.0 oz  . Drug use: No  . Sexual activity: Not on file  Other Topics Concern  . 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 ABDOMINAL HYSTERECTOMY    . bilateral carpal tunnel release  01/2008 and 06/2005   Dr. Daylene Katayama  . BREAST REDUCTION SURGERY  1999  . COLONOSCOPY  06-13-06   per Dr. Carlean Purl, hemorrhoids only, no polyps, repeat in 10 yrs   . ENDOVENOUS ABLATION SAPHENOUS VEIN W/ LASER  12-15-2011   right greater saphenous vein by Curt Jews MD  . ENDOVENOUS ABLATION SAPHENOUS VEIN W/ LASER  01-26-2012   left greater saphenous vein by Curt Jews MD  . ESOPHAGOGASTRODUODENOSCOPY  07-23-09   per Dr. Carlean Purl, gastritis   . hemorrhoid sugery  04/2009   Dr. Dalbert Batman  . left cataract and lens  07/2008   Dr. Ellie Lunch    Family History  Problem Relation Age of Onset  . Stroke Mother   . COPD Father   . Colon cancer Neg Hx   . Esophageal cancer Neg Hx   . Pancreatic cancer Neg Hx   . Rectal cancer Neg Hx   . Stomach cancer Neg Hx     Allergies  Allergen Reactions  . Tramadol Other (See Comments)    Insomnia  . Fish Allergy   . Lansoprazole     REACTION: headaches  .  Shellfish Allergy   . Sulfamethoxazole-Trimethoprim     Causes SOB and flu like symptoms  . Penicillins Rash    Has patient had a PCN reaction causing immediate rash, facial/tongue/throat swelling, SOB or lightheadedness with hypotension: No Has patient had a PCN reaction causing severe rash involving mucus membranes or skin necrosis: No Has patient had a PCN reaction that required hospitalization No Has patient had a PCN reaction occurring within the last 10 years: No If all of the above answers are "NO", then may proceed with Cephalosporin use.     Current Outpatient Medications on File Prior to Visit  Medication Sig Dispense Refill  . amLODipine (NORVASC) 5 MG tablet TAKE 1 TABLET(5 MG) BY MOUTH DAILY 90 tablet 3  . Calcium-Vitamin D-Vitamin K (CALCIUM + D) [id number] MG-UNT-MCG CHEW Chew 1 tablet by mouth daily.    . Cholecalciferol (VITAMIN D) 1000 UNITS capsule Take 2,000 Units by mouth daily.     Marland Kitchen  dicyclomine (BENTYL) 20 MG tablet Take 1 tablet (20 mg total) by mouth every 6 (six) hours. 737 tablet 3  . folic acid (FOLVITE) 1 MG tablet Take 1 mg by mouth daily.      Marland Kitchen KRILL OIL PO Take 1 tablet by mouth daily.    Marland Kitchen losartan-hydrochlorothiazide (HYZAAR) 100-25 MG tablet Take 1 tablet by mouth daily. 90 tablet 3  . metoprolol succinate (TOPROL-XL) 100 MG 24 hr tablet TAKE 1 TABLET BY MOUTH TWICE DAILY WITH OR IMMEDIATELY FOLLOWING A MEAL. 180 tablet 3  . ondansetron (ZOFRAN) 4 MG tablet Take 1 tablet (4 mg total) by mouth every 8 (eight) hours as needed for nausea or vomiting. 30 tablet 0  . Probiotic Product (PROBIOTIC DAILY PO) Take 1 tablet by mouth daily.    . valACYclovir (VALTREX) 1000 MG tablet Take 1,000 mg by mouth 2 (two) times daily as needed. For outbreaks  3  . zolpidem (AMBIEN) 10 MG tablet Take as needed for plane flights 10 tablet 0   No current facility-administered medications on file prior to visit.     BP (!) 138/58 (BP Location: Left Arm, Patient Position: Sitting, Cuff Size: Normal)   Pulse 66   Temp 98.4 F (36.9 C) (Oral)   Ht 5\' 2"  (1.575 m)   Wt 155 lb (70.3 kg)   SpO2 98%   BMI 28.35 kg/m      Review of Systems  Constitutional: Positive for activity change, appetite change and fatigue.  HENT: Positive for congestion, rhinorrhea and sore throat. Negative for dental problem, hearing loss, sinus pressure and tinnitus.   Eyes: Negative for pain, discharge and visual disturbance.  Respiratory: Positive for cough. Negative for shortness of breath.   Cardiovascular: Negative for chest pain, palpitations and leg swelling.  Gastrointestinal: Negative for abdominal distention, abdominal pain, blood in stool, constipation, diarrhea, nausea and vomiting.  Genitourinary: Negative for difficulty urinating, dysuria, flank pain, frequency, hematuria, pelvic pain, urgency, vaginal bleeding, vaginal discharge and vaginal pain.  Musculoskeletal: Negative for arthralgias,  gait problem and joint swelling.  Skin: Negative for rash.  Neurological: Negative for dizziness, syncope, speech difficulty, weakness, numbness and headaches.  Hematological: Negative for adenopathy.  Psychiatric/Behavioral: Negative for agitation, behavioral problems and dysphoric mood. The patient is not nervous/anxious.        Objective:   Physical Exam  Constitutional: She is oriented to person, place, and time. She appears well-developed and well-nourished.  HENT:  Head: Normocephalic.  Right Ear: External ear normal.  Left Ear: External ear normal.  Mouth/Throat: Oropharynx is clear and moist.  Eyes: Conjunctivae and EOM are normal. Pupils are equal, round, and reactive to light.  Neck: Normal range of motion. Neck supple. No thyromegaly present.  Cardiovascular: Normal rate, regular rhythm, normal heart sounds and intact distal pulses.  Pulmonary/Chest: Effort normal and breath sounds normal. No respiratory distress. She has no wheezes. She has no rales.  Abdominal: Soft. Bowel sounds are normal. She exhibits no mass. There is no tenderness.  Musculoskeletal: Normal range of motion.  Lymphadenopathy:    She has no cervical adenopathy.  Neurological: She is alert and oriented to person, place, and time.  Skin: Skin is warm and dry. No rash noted.  Psychiatric: She has a normal mood and affect. Her behavior is normal.          Assessment & Plan:    Viral URI with cough.  Will treat symptomatically  Nyoka Cowden

## 2017-06-22 NOTE — Patient Instructions (Signed)

## 2017-06-28 ENCOUNTER — Encounter: Payer: Self-pay | Admitting: Family Medicine

## 2017-06-28 ENCOUNTER — Ambulatory Visit (INDEPENDENT_AMBULATORY_CARE_PROVIDER_SITE_OTHER): Payer: Medicare Other | Admitting: Family Medicine

## 2017-06-28 VITALS — BP 132/70 | HR 59 | Temp 97.6°F | Wt 152.2 lb

## 2017-06-28 DIAGNOSIS — R42 Dizziness and giddiness: Secondary | ICD-10-CM

## 2017-06-28 LAB — CBC WITH DIFFERENTIAL/PLATELET
BASOS PCT: 0.6 % (ref 0.0–3.0)
Basophils Absolute: 0 10*3/uL (ref 0.0–0.1)
EOS PCT: 1.7 % (ref 0.0–5.0)
Eosinophils Absolute: 0.1 10*3/uL (ref 0.0–0.7)
HEMATOCRIT: 38 % (ref 36.0–46.0)
Hemoglobin: 13.3 g/dL (ref 12.0–15.0)
LYMPHS PCT: 24.5 % (ref 12.0–46.0)
Lymphs Abs: 1.3 10*3/uL (ref 0.7–4.0)
MCHC: 35 g/dL (ref 30.0–36.0)
MCV: 89.8 fl (ref 78.0–100.0)
Monocytes Absolute: 0.5 10*3/uL (ref 0.1–1.0)
Monocytes Relative: 9.5 % (ref 3.0–12.0)
NEUTROS ABS: 3.4 10*3/uL (ref 1.4–7.7)
Neutrophils Relative %: 63.7 % (ref 43.0–77.0)
PLATELETS: 248 10*3/uL (ref 150.0–400.0)
RBC: 4.23 Mil/uL (ref 3.87–5.11)
RDW: 12.7 % (ref 11.5–15.5)
WBC: 5.3 10*3/uL (ref 4.0–10.5)

## 2017-06-28 LAB — BASIC METABOLIC PANEL
BUN: 17 mg/dL (ref 6–23)
CALCIUM: 9.7 mg/dL (ref 8.4–10.5)
CO2: 31 meq/L (ref 19–32)
Chloride: 98 mEq/L (ref 96–112)
Creatinine, Ser: 0.78 mg/dL (ref 0.40–1.20)
GFR: 78.63 mL/min (ref >60.00)
GLUCOSE: 158 mg/dL — AB (ref 70–99)
Potassium: 4.3 mEq/L (ref 3.5–5.1)
SODIUM: 136 meq/L (ref 135–145)

## 2017-06-28 LAB — TSH: TSH: 0.32 u[IU]/mL — ABNORMAL LOW (ref 0.35–4.50)

## 2017-06-28 MED ORDER — MECLIZINE HCL 25 MG PO TABS: 25.0000 mg | ORAL_TABLET | ORAL | 2 refills | Status: DC | PRN

## 2017-06-28 NOTE — Progress Notes (Signed)
   Subjective:    Patient ID: [name], female    DOB: 1951/03/27, 67 y.o.   MRN: [id number]  HPI Here for one week of intermittent dizziness and lightheadedness. No headache. She has been getting over a viral URI this week.    Review of Systems  Constitutional: Negative.   HENT: Negative.   Eyes: Negative.   Respiratory: Negative.   Neurological: Positive for dizziness and light-headedness. Negative for speech difficulty, weakness and headaches.       Objective:   Physical Exam  Constitutional: She is oriented to person, place, and time. She appears well-developed and well-nourished.  HENT:  Right Ear: External ear normal.  Left Ear: External ear normal.  Nose: Nose normal.  Mouth/Throat: Oropharynx is clear and moist.  Eyes: Conjunctivae are normal.  Neck: Neck supple. No thyromegaly present.  Cardiovascular: Normal rate, regular rhythm, normal heart sounds and intact distal pulses.  Pulmonary/Chest: Effort normal and breath sounds normal. No respiratory distress. She has no wheezes. She has no rales.  Lymphadenopathy:    She has no cervical adenopathy.  Neurological: She is oriented to person, place, and time. No cranial nerve deficit. Coordination normal.          Assessment & Plan:  vertigo secondary to a viral URI. Try Meclizine and Zofran prn. Drink fluids.  Alysia Penna, MD

## 2017-07-25 DIAGNOSIS — M25542 Pain in joints of left hand: Secondary | ICD-10-CM | POA: Diagnosis not present

## 2017-07-25 DIAGNOSIS — M654 Radial styloid tenosynovitis [de Quervain]: Secondary | ICD-10-CM | POA: Diagnosis not present

## 2017-07-25 DIAGNOSIS — M60242 Foreign body granuloma of soft tissue, not elsewhere classified, left hand: Secondary | ICD-10-CM | POA: Diagnosis not present

## 2017-07-25 DIAGNOSIS — L608 Other nail disorders: Secondary | ICD-10-CM | POA: Diagnosis not present

## 2017-07-25 DIAGNOSIS — M25742 Osteophyte, left hand: Secondary | ICD-10-CM | POA: Diagnosis not present

## 2017-07-25 DIAGNOSIS — M67441 Ganglion, right hand: Secondary | ICD-10-CM | POA: Diagnosis not present

## 2017-08-08 DIAGNOSIS — Z961 Presence of intraocular lens: Secondary | ICD-10-CM | POA: Diagnosis not present

## 2017-08-31 ENCOUNTER — Telehealth: Payer: Self-pay | Admitting: Family Medicine

## 2017-08-31 NOTE — Telephone Encounter (Signed)
Copied from [facility] [id number]. Topic: Quick Communication - See Telephone Encounter >> Aug 31, 2017  4:19 PM Aurelio Brash B wrote: CRM for notification. See Telephone encounter for: 08/31/17. PT called to say her pharmacy sent her an email and said  her losartan-hydrochlorothiazide (HYZAAR) 100-25 MG tablet  is recalled  and she needs to get Dr to prescribe a different medication.

## 2017-09-01 ENCOUNTER — Other Ambulatory Visit: Payer: Self-pay

## 2017-09-01 MED ORDER — LOSARTAN POTASSIUM 100 MG PO TABS: 100.0000 mg | ORAL_TABLET | Freq: Every day | ORAL | 3 refills | Status: DC

## 2017-09-01 MED ORDER — HYDROCHLOROTHIAZIDE 25 MG PO TABS: 25.0000 mg | ORAL_TABLET | Freq: Every day | ORAL | 3 refills | Status: DC

## 2017-09-01 NOTE — Telephone Encounter (Signed)
Rx has been sent into [facility] as requested by pt. Pt advised of this change.

## 2017-09-01 NOTE — Telephone Encounter (Signed)
We have sent in alternative in for the pt split the medications losartan 100 MG and HCTZ 25 MG   Given the verbal OK by Dr. Sarajane Jews

## 2017-09-04 ENCOUNTER — Encounter: Payer: Self-pay | Admitting: Family Medicine

## 2017-09-04 ENCOUNTER — Ambulatory Visit (INDEPENDENT_AMBULATORY_CARE_PROVIDER_SITE_OTHER): Payer: Medicare Other | Admitting: Family Medicine

## 2017-09-04 VITALS — BP 102/68 | HR 69 | Temp 98.2°F | Ht 61.25 in | Wt 154.6 lb

## 2017-09-04 DIAGNOSIS — I10 Essential (primary) hypertension: Secondary | ICD-10-CM

## 2017-09-04 DIAGNOSIS — K219 Gastro-esophageal reflux disease without esophagitis: Secondary | ICD-10-CM

## 2017-09-04 DIAGNOSIS — M545 Low back pain, unspecified: Secondary | ICD-10-CM

## 2017-09-04 DIAGNOSIS — R739 Hyperglycemia, unspecified: Secondary | ICD-10-CM

## 2017-09-04 DIAGNOSIS — G8929 Other chronic pain: Secondary | ICD-10-CM | POA: Diagnosis not present

## 2017-09-04 LAB — BASIC METABOLIC PANEL
BUN: 15 mg/dL (ref 6–23)
CHLORIDE: 101 meq/L (ref 96–112)
CO2: 31 meq/L (ref 19–32)
CREATININE: 0.9 mg/dL (ref 0.40–1.20)
Calcium: 9.8 mg/dL (ref 8.4–10.5)
GFR: 66.62 mL/min (ref >60.00)
GLUCOSE: 102 mg/dL — AB (ref 70–99)
Potassium: 3.6 mEq/L (ref 3.5–5.1)
SODIUM: 140 meq/L (ref 135–145)

## 2017-09-04 LAB — HEPATIC FUNCTION PANEL
ALBUMIN: 4.4 g/dL (ref 3.5–5.2)
ALK PHOS: 46 U/L (ref 39–117)
ALT: 20 U/L (ref 0–35)
AST: 26 U/L (ref 0–37)
BILIRUBIN DIRECT: 0.2 mg/dL (ref 0.0–0.3)
TOTAL PROTEIN: 7.4 g/dL (ref 6.0–8.3)
Total Bilirubin: 1 mg/dL (ref 0.2–1.2)

## 2017-09-04 LAB — CBC WITH DIFFERENTIAL/PLATELET
BASOS PCT: 1.2 % (ref 0.0–3.0)
Basophils Absolute: 0.1 10*3/uL (ref 0.0–0.1)
EOS PCT: 3.1 % (ref 0.0–5.0)
Eosinophils Absolute: 0.1 10*3/uL (ref 0.0–0.7)
HCT: 39 % (ref 36.0–46.0)
Hemoglobin: 13.5 g/dL (ref 12.0–15.0)
LYMPHS ABS: 1.9 10*3/uL (ref 0.7–4.0)
Lymphocytes Relative: 40.1 % (ref 12.0–46.0)
MCHC: 34.7 g/dL (ref 30.0–36.0)
MCV: 91 fl (ref 78.0–100.0)
MONO ABS: 0.4 10*3/uL (ref 0.1–1.0)
Monocytes Relative: 7.9 % (ref 3.0–12.0)
NEUTROS ABS: 2.2 10*3/uL (ref 1.4–7.7)
NEUTROS PCT: 47.7 % (ref 43.0–77.0)
PLATELETS: 213 10*3/uL (ref 150.0–400.0)
RBC: 4.29 Mil/uL (ref 3.87–5.11)
RDW: 12.7 % (ref 11.5–15.5)
WBC: 4.7 10*3/uL (ref 4.0–10.5)

## 2017-09-04 LAB — LIPID PANEL
CHOL/HDL RATIO: 5
Cholesterol: 185 mg/dL (ref 0–200)
HDL: 39.9 mg/dL (ref >39.00)
LDL CALC: 112 mg/dL — AB (ref 0–99)
NONHDL: 145.5
Triglycerides: 169 mg/dL — ABNORMAL HIGH (ref 0.0–149.0)
VLDL: 33.8 mg/dL (ref 0.0–40.0)

## 2017-09-04 LAB — POC URINALSYSI DIPSTICK (AUTOMATED)
Bilirubin, UA: NEGATIVE
GLUCOSE UA: NEGATIVE
KETONES UA: NEGATIVE
Leukocytes, UA: NEGATIVE
Nitrite, UA: NEGATIVE
Protein, UA: NEGATIVE
RBC UA: NEGATIVE
Spec Grav, UA: 1.015 (ref 1.010–1.025)
Urobilinogen, UA: 0.2 E.U./dL
pH, UA: 7 (ref 5.0–8.0)

## 2017-09-04 LAB — TSH: TSH: 1.01 u[IU]/mL (ref 0.35–4.50)

## 2017-09-04 LAB — HEMOGLOBIN A1C: HEMOGLOBIN A1C: 5.9 % (ref 4.6–6.5)

## 2017-09-04 NOTE — Progress Notes (Signed)
   Subjective:    Patient ID: [name], female    DOB: December 10, 1950, 67 y.o.   MRN: [id number]  HPI Here to follow up on issues. She feels well in general. She stays active and enjoys yoga and playing golf. Her BP at home is stable in th range of 110s to 120s over 70s. She is recovering from a surgery to remove a mucus cyst from the left index finger per Dr. Amedeo Plenty last month.   Review of Systems  Constitutional: Negative.   HENT: Negative.   Eyes: Negative.   Respiratory: Negative.   Cardiovascular: Negative.   Gastrointestinal: Negative.   Genitourinary: Negative for decreased urine volume, difficulty urinating, dyspareunia, dysuria, enuresis, flank pain, frequency, hematuria, pelvic pain and urgency.  Musculoskeletal: Negative.   Skin: Negative.   Neurological: Negative.   Psychiatric/Behavioral: Negative.        Objective:   Physical Exam  Constitutional: She is oriented to person, place, and time. She appears well-developed and well-nourished. No distress.  HENT:  Head: Normocephalic and atraumatic.  Right Ear: External ear normal.  Left Ear: External ear normal.  Nose: Nose normal.  Mouth/Throat: Oropharynx is clear and moist. No oropharyngeal exudate.  Eyes: Pupils are equal, round, and reactive to light. Conjunctivae and EOM are normal. No scleral icterus.  Neck: Normal range of motion. Neck supple. No JVD present. No thyromegaly present.  Cardiovascular: Normal rate, regular rhythm, normal heart sounds and intact distal pulses. Exam reveals no gallop and no friction rub.  No murmur heard. Pulmonary/Chest: Effort normal and breath sounds normal. No respiratory distress. She has no wheezes. She has no rales. She exhibits no tenderness.  Abdominal: Soft. Bowel sounds are normal. She exhibits no distension and no mass. There is no tenderness. There is no rebound and no guarding.  Musculoskeletal: Normal range of motion. She exhibits no edema or tenderness.    Lymphadenopathy:    She has no cervical adenopathy.  Neurological: She is alert and oriented to person, place, and time. She has normal reflexes. She displays normal reflexes. No cranial nerve deficit. She exhibits normal muscle tone. Coordination normal.  Skin: Skin is warm and dry. No rash noted. No erythema.  Psychiatric: She has a normal mood and affect. Her behavior is normal. Judgment and thought content normal.          Assessment & Plan:  Her HTN is stable. She is recovering from finger surgery. Her abdominal pain is stable. Her low back pain is stable. We will get fasting labs to check her lipids, glucose, etc.  Alysia Penna, MD

## 2017-09-19 ENCOUNTER — Other Ambulatory Visit: Payer: Self-pay | Admitting: Family Medicine

## 2017-10-02 ENCOUNTER — Encounter: Payer: Self-pay | Admitting: Family Medicine

## 2017-10-02 ENCOUNTER — Ambulatory Visit (INDEPENDENT_AMBULATORY_CARE_PROVIDER_SITE_OTHER): Payer: Medicare Other | Admitting: Family Medicine

## 2017-10-02 VITALS — BP 120/58 | HR 72 | Temp 98.9°F | Ht 61.25 in | Wt 155.2 lb

## 2017-10-02 DIAGNOSIS — J209 Acute bronchitis, unspecified: Secondary | ICD-10-CM

## 2017-10-02 MED ORDER — LEVOFLOXACIN 500 MG PO TABS: 500.0000 mg | ORAL_TABLET | Freq: Every day | ORAL | 0 refills | Status: DC

## 2017-10-02 MED ORDER — HYDROCODONE-HOMATROPINE 5-1.5 MG/5ML PO SYRP: 5.0000 mL | ORAL_SOLUTION | ORAL | 0 refills | Status: DC | PRN

## 2017-10-02 NOTE — Progress Notes (Signed)
   Subjective:    Patient ID: [name], female    DOB: [birth year], 67 y.o.   MRN: [id number]  HPI Here for 5 days of stuffy head, PND, chest tightness and coughing up yellow sputum. No fever. On Delsym.    Review of Systems  Constitutional: Negative.   HENT: Positive for congestion, postnasal drip and sore throat. Negative for sinus pressure and sinus pain.   Eyes: Negative.   Respiratory: Positive for cough and chest tightness.        Objective:   Physical Exam  Constitutional: She appears well-developed and well-nourished.  HENT:  Right Ear: External ear normal.  Left Ear: External ear normal.  Nose: Nose normal.  Mouth/Throat: Oropharynx is clear and moist.  Eyes: Conjunctivae are normal.  Neck: No thyromegaly present.  Pulmonary/Chest: Effort normal. No stridor. No respiratory distress. She has no wheezes. She has no rales.  Scattered rhonchi   Lymphadenopathy:    She has no cervical adenopathy.          Assessment & Plan:  Bronchitis, treat with Levaquin. Alysia Penna, MD

## 2017-10-03 ENCOUNTER — Telehealth: Payer: Self-pay | Admitting: Family Medicine

## 2017-10-03 ENCOUNTER — Encounter: Payer: Self-pay | Admitting: Family Medicine

## 2017-10-03 ENCOUNTER — Ambulatory Visit (INDEPENDENT_AMBULATORY_CARE_PROVIDER_SITE_OTHER): Payer: Medicare Other | Admitting: Family Medicine

## 2017-10-03 VITALS — BP 148/64 | HR 66 | Temp 97.9°F | Ht 61.25 in

## 2017-10-03 DIAGNOSIS — J209 Acute bronchitis, unspecified: Secondary | ICD-10-CM

## 2017-10-03 DIAGNOSIS — T50905A Adverse effect of unspecified drugs, medicaments and biological substances, initial encounter: Secondary | ICD-10-CM | POA: Diagnosis not present

## 2017-10-03 MED ORDER — CEFTRIAXONE SODIUM 1 G IJ SOLR
1.0000 g | Freq: Once | INTRAMUSCULAR | Status: AC
  Administered 2017-10-03: 1 g via INTRAMUSCULAR

## 2017-10-03 MED ORDER — PROMETHAZINE HCL 25 MG/ML IJ SOLN
50.0000 mg | Freq: Once | INTRAMUSCULAR | Status: AC
  Administered 2017-10-03: 50 mg via INTRAMUSCULAR

## 2017-10-03 MED ORDER — PROMETHAZINE HCL 25 MG PO TABS
25.0000 mg | ORAL_TABLET | ORAL | 1 refills | Status: DC | PRN
Start: 2017-10-03 — End: 2017-10-19

## 2017-10-03 MED ORDER — CEFUROXIME AXETIL 500 MG PO TABS: 500.0000 mg | ORAL_TABLET | Freq: Two times a day (BID) | ORAL | 0 refills | Status: DC

## 2017-10-03 NOTE — Progress Notes (Signed)
   Subjective:    Patient ID: [name], female    DOB: April 04, 1951, 67 y.o.   MRN: [id number]  HPI Here for nausea all day today with several episodes of vomiting. She still has sinus congestion, PND, wheezing and a cough producing yellow sputum. No fever. She was seen here yesterday for the respiratory symptoms and she was started on Levaquin 500 mg daily. She took one dose last night and another this morning. She developed the nausea and vomiting today. She has been taking Ondansetron tablets but these have not helped. Her husband drove her here.    Review of Systems  Constitutional: Negative for chills, diaphoresis and fever.  HENT: Positive for congestion, postnasal drip and sinus pressure. Negative for ear pain, sinus pain and sore throat.   Eyes: Negative.   Respiratory: Positive for cough, chest tightness and wheezing.   Cardiovascular: Negative.   Gastrointestinal: Positive for nausea and vomiting. Negative for abdominal distention, abdominal pain, anal bleeding, blood in stool, constipation and diarrhea.       Objective:   Physical Exam  Constitutional:  She appears ill, in a wheelchair  HENT:  Right Ear: External ear normal.  Left Ear: External ear normal.  Nose: Nose normal.  Mouth/Throat: Oropharynx is clear and moist.  Eyes: Conjunctivae are normal.  Neck: Neck supple. No thyromegaly present.  Cardiovascular: Normal rate, regular rhythm, normal heart sounds and intact distal pulses.  Pulmonary/Chest: Effort normal. No respiratory distress. She has no rales.  Scattered rhonchi and wheezing   Lymphadenopathy:    She has no cervical adenopathy.          Assessment & Plan:  She has a partially treated bronchitis, but she also has GI side effects from the [place]. She will stop this immediately. Given a shot of Phenergan, and she will use Phenergan tablets as needed instead of Ondansetron. Given a shot of Rocephin, and she will start on Ceftin 500 mg bid tomorrow  morning. Drink plenty of fluids.  Alysia Penna, MD

## 2017-10-03 NOTE — Telephone Encounter (Signed)
Pt seen in office on 6/4

## 2017-10-03 NOTE — Telephone Encounter (Signed)
Copied from [place] [id number]. Topic: Quick Communication - Rx Refill/Question >> Oct 03, 2017  2:52 PM Keene Breath wrote: Medication:  Patient states that the antibiotic that was prescribed yesterday is making her tired and dizzy and would like a different antibiotic.   Preferred Pharmacy (with phone number or street name):  [facility], [address] AT [address]**

## 2017-10-03 NOTE — Telephone Encounter (Signed)
I asked Dr. Sarajane Jews and he approved that the pt could cut levofloxacin 500 MG in half and take 1/2 in the A.M. & 1/2 in the P.M.

## 2017-10-03 NOTE — Addendum Note (Signed)
Addended by: Myriam Forehand on: 10/03/2017 05:50 PM   Modules accepted: Orders

## 2017-10-16 ENCOUNTER — Other Ambulatory Visit: Payer: Self-pay | Admitting: Family Medicine

## 2017-10-19 ENCOUNTER — Encounter: Payer: Self-pay | Admitting: Internal Medicine

## 2017-10-19 ENCOUNTER — Ambulatory Visit (INDEPENDENT_AMBULATORY_CARE_PROVIDER_SITE_OTHER)
Admission: RE | Admit: 2017-10-19 | Discharge: 2017-10-19 | Disposition: A | Discharge status: Home / Self Care | Payer: Medicare Other | Source: Ambulatory Visit | Attending: Internal Medicine | Admitting: Internal Medicine

## 2017-10-19 ENCOUNTER — Ambulatory Visit: Payer: Medicare Other | Admitting: Internal Medicine

## 2017-10-19 VITALS — BP 132/68 | HR 74 | Ht 62.0 in | Wt 153.0 lb

## 2017-10-19 DIAGNOSIS — R05 Cough: Secondary | ICD-10-CM | POA: Diagnosis not present

## 2017-10-19 DIAGNOSIS — R058 Other specified cough: Secondary | ICD-10-CM | POA: Insufficient documentation

## 2017-10-19 NOTE — Patient Instructions (Signed)
The next time your cough flares I recommend:  1) delsym 2 tsp every 12 hours as needed   2) Try prilosec otc 20mg   Take 30-60 min before first meal of the day and Pepcid ac (famotidine) 20 mg one @  bedtime until cough is completely gone for at least a week without the need for cough suppression     3) GERD (REFLUX)  is an extremely common cause of intermittent respiratory symptoms just like yours , many times with no obvious heartburn at all.    It can be treated with medication, but also with lifestyle changes including elevation of the head of your bed (ideally with 6 inch  bed blocks),  Smoking cessation, avoidance of late meals, excessive alcohol, and avoid fatty foods, chocolate, peppermint, colas, red wine, and acidic juices such as orange juice.  NO MINT OR MENTHOL PRODUCTS SO NO COUGH DROPS   USE SUGARLESS CANDY INSTEAD (Jolley ranchers or Stover's or Life Savers) or even ice chips will also do - the key is to swallow to prevent all throat clearing. NO OIL BASED VITAMINS - use powdered substitutes.   Please remember to go to the  x-ray department downstairs in the basement  for your tests - we will call you with the results when they are available.      Pulmonary follow up is as needed

## 2017-10-19 NOTE — Progress Notes (Signed)
Subjective:     Patient ID: [name], female   DOB: 28-May-1950,   MRN: [id number]  HPI  67 yo [place] female never smoker emigrated in 1972 perfectly healthy lung hx entire life with new problem of persistent cough that started   w/in 3 days of getting off the jet in [place] in Feb 2019  with several companions sick too rx otc - felt worse while there flew back to [place]  Dierdre Highman same day = 06/22/17  Dx viral [place] rx mucinex dm and hydrocodone all better until end of May with second bout of severe cough assoc with stuffy head/ pnd/ chest tightnesss/ cough with yellow mucus > could not take levaquin/ rx hydrocodone then cefuroxime x 10 days and mucus turned clear but still coughing so self referred to pulmonary clinic 10/19/2017     10/19/2017 1st [facility] office visit/ [name]   Chief Complaint  Patient presents with  . Pulmonary Consult    Self referral. Pt c/o cough since May 2019.  She has been treated for bronchitis. Her cough is currently non prod and bothers her most in the afternoon.   cough worse before before supper, better p supper and does  fine asleep, no longer with nasal symptoms and no assoc wheeze/ some urinary incont when cough was bad but better now and not even taking otcs at this point  Not limited by breathing from desired activities    No obvious day to day or daytime variability or assoc excess/ purulent sputum or mucus plugs or hemoptysis or cp or chest tightness, subjective wheeze or overt sinus or hb symptoms. No unusual exposure hx or h/o childhood pna/ asthma or knowledge of premature birth.  Sleeping  Fine flat  without nocturnal  or early am exacerbation  of respiratory  c/o's or need for noct saba. Also denies any obvious fluctuation of symptoms with weather or environmental changes or other aggravating or alleviating factors except as outlined above   Current Allergies, Complete Past Medical History, Past Surgical History, Family History, and Social History  were reviewed in [facility] record.  ROS  The following are not active complaints unless bolded Hoarseness, sore throat, dysphagia, dental problems, itching, sneezing,  nasal congestion or discharge of excess mucus or purulent secretions, ear ache,   fever, chills, sweats, unintended wt loss or wt gain, classically pleuritic or exertional cp,  orthopnea pnd or arm/hand swelling  or leg swelling, presyncope, palpitations, abdominal pain, anorexia, nausea, vomiting, diarrhea  or change in bowel habits or change in bladder habits, change in stools or change in urine, dysuria, hematuria,  rash, arthralgias, visual complaints, headache, numbness, weakness or ataxia or problems with walking or coordination,  change in mood or  memory.        Current Meds  Medication Sig  . amLODipine (NORVASC) 5 MG tablet TAKE 1 TABLET BY MOUTH  DAILY  . Calcium-Vitamin D-Vitamin K (CALCIUM + D) [phone] MG-UNT-MCG CHEW Chew 1 tablet by mouth daily.  . Cholecalciferol (VITAMIN D) 1000 UNITS capsule Take 2,000 Units by mouth daily.   . hydrochlorothiazide (HYDRODIURIL) 25 MG tablet Take 1 tablet (25 mg total) by mouth daily.  Marland Kitchen HYDROcodone-homatropine (HYDROMET) 5-1.5 MG/5ML syrup Take 5 mLs by mouth every 4 (four) hours as needed.  Marland Kitchen KRILL OIL PO Take 1 tablet by mouth daily.  Marland Kitchen losartan (COZAAR) 100 MG tablet Take 1 tablet (100 mg total) by mouth daily.  . metoprolol succinate (TOPROL-XL) 100 MG 24  hr tablet TAKE 1 TABLET BY MOUTH  TWICE DAILY WITH OR  IMMEDIATELY FOLLOWING WITH  MEAL.  . Probiotic Product (PROBIOTIC DAILY PO) Take 1 tablet by mouth daily.  . valACYclovir (VALTREX) 1000 MG tablet Take 1,000 mg by mouth 2 (two) times daily as needed. For outbreaks         Review of Systems     Objective:   Physical Exam    amb korean female nad   Wt Readings from Last 3 Encounters:  10/19/17 153 lb (69.4 kg)  10/02/17 155 lb 3.2 oz (70.4 kg)  09/04/17 154 lb 9.6 oz (70.1 kg)      Vital signs reviewed - Note on arrival 02 sats  96% on RA       HEENT: nl dentition, turbinates bilaterally, and oropharynx. Nl external ear canals without cough reflex   NECK :  without JVD/Nodes/TM/ nl carotid upstrokes bilaterally   LUNGS: no acc muscle use,  Nl contour chest which is clear to A and P bilaterally without cough on insp or exp maneuvers   CV:  RRR  no s3 or murmur or increase in P2, and no edema   ABD:  soft and nontender with nl inspiratory excursion in the supine position. No bruits or organomegaly appreciated, bowel sounds nl  MS:  Nl gait/ ext warm without deformities, calf tenderness, cyanosis or clubbing No obvious joint restrictions   SKIN: warm and dry without lesions    NEURO:  alert, approp, nl sensorium with  no motor or cerebellar deficits apparent.        CXR PA and Lateral:   10/19/2017 :    I personally reviewed images and agree with radiology impression as follows:    1. Clear lungs. 2. Aortic calcification.      Labs reviewed Lab Results  Component Value Date   WBC 4.7 09/04/2017   HGB 13.5 09/04/2017   HCT 39.0 09/04/2017   MCV 91.0 09/04/2017   PLT 213.0 09/04/2017       EOS                                                               0.1                                    09/04/2017    Assessment:

## 2017-10-20 ENCOUNTER — Encounter: Payer: Self-pay | Admitting: Internal Medicine

## 2017-10-20 NOTE — Progress Notes (Signed)
Spoke with pt and notified of results per Dr. Wert. Pt verbalized understanding and denied any questions. 

## 2017-10-20 NOTE — Assessment & Plan Note (Addendum)
Upper airway cough syndrome (previously labeled PNDS),  is so named because it's frequently impossible to sort out how much is  CR/sinusitis with freq throat clearing (which can be related to primary GERD)   vs  causing  secondary (" extra esophageal")  GERD from wide swings in gastric pressure that occur with throat clearing, often  promoting self use of mint and menthol lozenges that reduce the lower esophageal sphincter tone and exacerbate the problem further in a cyclical fashion.   These are the same pts (now being labeled as having "irritable larynx syndrome" by some cough centers) who not infrequently have a history of having failed to tolerate ace inhibitors,  dry powder inhalers or biphosphonates or report having atypical/extraesophageal reflux symptoms that don't respond to standard doses of PPI  and are easily confused as having aecopd or asthma flares by even experienced allergists/ pulmonologists (myself included).  Upper airway cough syndrome (previously labeled PNDS),  is so named because it's frequently impossible to sort out how much is  CR/sinusitis with freq throat clearing (which can be related to primary GERD)   vs  causing  secondary (" extra esophageal")  GERD from wide swings in gastric pressure that occur with throat clearing, often  promoting self use of mint and menthol lozenges that reduce the lower esophageal sphincter tone and exacerbate the problem further in a cyclical fashion.   These are the same pts (now being labeled as having "irritable larynx syndrome" by some cough centers) who not infrequently have a history of having failed to tolerate ace inhibitors,  dry powder inhalers or biphosphonates or report having atypical/extraesophageal reflux symptoms that don't respond to standard doses of PPI  and are easily confused as having aecopd or asthma flares by even experienced allergists/ pulmonologists (myself included).   Of the three most common causes of  Sub-acute /  recurrent or chronic cough, only one (GERD)  can actually contribute to/ trigger  the other two (asthma and post nasal drip syndrome)  and perpetuate the cylce of cough.  While not intuitively obvious, many patients with chronic low grade reflux do not cough until there is a primary insult that disturbs the protective epithelial barrier and exposes sensitive nerve endings.   This is typically viral but can due to PNDS and  either may apply here.     The point is that once this occurs, it is difficult to eliminate the cycle  using anything but a maximally effective acid suppression regimen at least in the short run, accompanied by an appropriate diet to address non acid GERD and control / eliminate the cough itself with delsym and if not controlled then use the hydromet regularly x 3 days to achieve complete cough elimination and if still coughing at that point then return here for the chronic cough w/u.   As she has no h/o sinus dz or atopy / asthma/eos  I don't feel further w/u is needed at this point but welcomed her to return here at any point   Total time devoted to counseling  > 50 % of initial 60 min office visit:  review case with pt/ discussion of options/alternatives/ personally creating written customized instructions  in presence of pt  then going over those specific  Instructions directly with the pt including how to use all of the meds but in particular covering each new medication in detail and the difference between the maintenance= "automatic" meds and the prns using an action plan format for the latter (  If this problem/symptom => do that organization reading Left to right).  Please see AVS from this visit for a full list of these instructions which I personally wrote for this pt and  are unique to this visit.

## 2017-11-07 ENCOUNTER — Telehealth: Payer: Self-pay | Admitting: Family Medicine

## 2017-11-07 NOTE — Telephone Encounter (Signed)
Patient needs to get the following prescriptions filled through [facility]:  HCTZ Losartan Metoprolol  Prescriptions were sent to [facility] but were not picked up.  They are free through [facility].  Patient is almost out of medication.

## 2017-11-08 NOTE — Telephone Encounter (Signed)
Rx's have been sent to [facility] already.   HCTZ and lisinopril were sent on 09/01/2017   Metoprolol was sent on 10/16/2017.  Called pt and left a VM advised pt to call   [facility] first and if they don't have it to give our office a call back and left our call back number. [phone].

## 2017-12-18 DIAGNOSIS — M65832 Other synovitis and tenosynovitis, left forearm: Secondary | ICD-10-CM | POA: Diagnosis not present

## 2017-12-18 DIAGNOSIS — M65839 Other synovitis and tenosynovitis, unspecified forearm: Secondary | ICD-10-CM | POA: Diagnosis not present

## 2017-12-18 DIAGNOSIS — M79642 Pain in left hand: Secondary | ICD-10-CM | POA: Diagnosis not present

## 2017-12-29 ENCOUNTER — Other Ambulatory Visit: Payer: Self-pay | Admitting: Family Medicine

## 2018-02-05 ENCOUNTER — Telehealth: Payer: Self-pay | Admitting: Internal Medicine

## 2018-02-05 ENCOUNTER — Telehealth: Payer: Self-pay | Admitting: Family Medicine

## 2018-02-05 MED ORDER — ESOMEPRAZOLE MAGNESIUM 40 MG PO CPDR: 40.0000 mg | DELAYED_RELEASE_CAPSULE | Freq: Two times a day (BID) | ORAL | 1 refills | Status: DC

## 2018-02-05 MED ORDER — SUCRALFATE 1 G PO TABS: 1.0000 g | ORAL_TABLET | Freq: Three times a day (TID) | ORAL | 1 refills | Status: DC

## 2018-02-05 NOTE — Telephone Encounter (Signed)
She was seen last year for EGD/colon  Long hx functional dyspepsia - she had gastroenmteritis 10 d ago and still having pc burning epigastric pain despite omeprazole 20 mg daily, FD Gard, dicyclomine, Gaviscon  I think post-infectious sx flare I sent rxs  1) Esopmeprazole 40 mg bid 2) carafate 1 g tidac/hs 3) Pepto bismol  She will contact me in a few days w/ update  Does not need a visit at this time

## 2018-02-05 NOTE — Telephone Encounter (Signed)
Patient states she has been having abd pain and not been able to eat for the last 10 days. Patient wanting some advice from Dr.Gessner, since pt states she knows him personally.

## 2018-02-05 NOTE — Telephone Encounter (Signed)
Pt is in the office today want to change her pharmacy to [facility] on [address] for Rx losartan-hydrochlorothiazide.

## 2018-02-05 NOTE — Telephone Encounter (Signed)
Patient not seen since 2016.  I was going to offer APP appt. Have you spoken with her or have a different plan?

## 2018-02-06 ENCOUNTER — Telehealth: Payer: Self-pay

## 2018-02-06 MED ORDER — ESOMEPRAZOLE MAGNESIUM 40 MG PO CPDR: 40.0000 mg | DELAYED_RELEASE_CAPSULE | Freq: Two times a day (BID) | ORAL | 1 refills | Status: DC

## 2018-02-06 MED ORDER — SUCRALFATE 1 G PO TABS: 1.0000 g | ORAL_TABLET | Freq: Three times a day (TID) | ORAL | 1 refills | Status: DC

## 2018-02-06 NOTE — Telephone Encounter (Signed)
90 day with 1 refill ok

## 2018-02-06 NOTE — Telephone Encounter (Signed)
Pharmacy sent fax requesting 90 day supply of patient's sucralfate tablets. Would you like me to do that Sir? I wasn't sure if this was a long term medicine for her.

## 2018-02-06 NOTE — Telephone Encounter (Signed)
Pharmacy has also requested 90 day supply of generic Nexium be sent in. This has been done as well.

## 2018-03-06 ENCOUNTER — Ambulatory Visit (INDEPENDENT_AMBULATORY_CARE_PROVIDER_SITE_OTHER): Payer: Medicare Other

## 2018-03-06 DIAGNOSIS — Z23 Encounter for immunization: Secondary | ICD-10-CM

## 2018-05-09 DIAGNOSIS — Z1231 Encounter for screening mammogram for malignant neoplasm of breast: Secondary | ICD-10-CM | POA: Diagnosis not present

## 2018-07-04 DIAGNOSIS — M79641 Pain in right hand: Secondary | ICD-10-CM | POA: Diagnosis not present

## 2018-07-04 DIAGNOSIS — M65831 Other synovitis and tenosynovitis, right forearm: Secondary | ICD-10-CM | POA: Diagnosis not present

## 2018-07-06 ENCOUNTER — Telehealth: Payer: Self-pay

## 2018-07-06 NOTE — Telephone Encounter (Signed)
Author phoned pt. To reschedule awv. Appointment rescheduled for 3/23.

## 2018-07-17 ENCOUNTER — Other Ambulatory Visit: Payer: Self-pay | Admitting: Family Medicine

## 2018-07-23 ENCOUNTER — Ambulatory Visit: Payer: Medicare Other

## 2018-09-10 ENCOUNTER — Ambulatory Visit: Payer: Medicare Other

## 2018-09-10 ENCOUNTER — Encounter: Payer: Medicare Other | Admitting: Family Medicine

## 2018-10-12 ENCOUNTER — Other Ambulatory Visit: Payer: Self-pay | Admitting: Family Medicine

## 2018-10-17 NOTE — Telephone Encounter (Signed)
Patient called to inquire about refill that was refused per pharmacy. Patient appointment was rescheduled due to providers request in May please advise. Rx  hydrochlorothiazide (HYDRODIURIL) 25 MG tablet and losartan (COZAAR) 100 MG tablet

## 2018-11-05 ENCOUNTER — Telehealth: Payer: Self-pay | Admitting: General Surgery

## 2018-11-05 NOTE — Telephone Encounter (Signed)
Covid-19 screening questions   Do you now or have you had a fever in the last 14 days? NO  Do you have any respiratory symptoms of shortness of breath or cough now or in the last 14 days?NO  Do you have any family members or close contacts with diagnosed or suspected Covid-19 in the past 14 days? NO  Have you been tested for Covid-19 and found to be positive? NO  Patient was informed to wear a mask to her appointment, if she brings 1 health care partner they will need to mask as well. She verbalized understanding.

## 2018-11-06 ENCOUNTER — Encounter: Payer: Self-pay | Admitting: Internal Medicine

## 2018-11-06 ENCOUNTER — Ambulatory Visit (INDEPENDENT_AMBULATORY_CARE_PROVIDER_SITE_OTHER): Payer: Medicare Other | Admitting: Internal Medicine

## 2018-11-06 ENCOUNTER — Other Ambulatory Visit: Payer: Self-pay

## 2018-11-06 DIAGNOSIS — K30 Functional dyspepsia: Secondary | ICD-10-CM | POA: Diagnosis not present

## 2018-11-06 MED ORDER — SUCRALFATE 1 G PO TABS: 1.0000 g | ORAL_TABLET | Freq: Three times a day (TID) | ORAL | 3 refills | Status: DC

## 2018-11-06 MED ORDER — ESOMEPRAZOLE MAGNESIUM 40 MG PO CPDR: 40.0000 mg | DELAYED_RELEASE_CAPSULE | Freq: Two times a day (BID) | ORAL | 3 refills | Status: DC

## 2018-11-06 NOTE — Progress Notes (Signed)
[name] 67 y.o. 1950/08/09 [id number]  Assessment & Plan:  Functional dyspepsia Stable EGD 2018 ok Continue esomeprazole +/- sucralfate See me prn      Subjective:   Chief Complaint: f/u dyspepsia  HPI  Patient here for f/u and med refills. Many yrs of functional dyspepsia and IBS. Had sucralfate added earlier this year and it helps. Stopped it and had to go back after a couple of months off. Tends to stay on esomeprazole qd-bid. Is hiking on nature trails most days and golfing agin. 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 Husband is well.  Wt Readings from Last 3 Encounters:  11/06/18 151 lb (68.5 kg)  10/19/17 153 lb (69.4 kg)  10/02/17 155 lb 3.2 oz (70.4 kg)    Allergies  Allergen Reactions  . Tramadol Other (See Comments)    Insomnia  . Fish Allergy   . Lansoprazole     REACTION: headaches  . Levaquin [Levofloxacin In D5w] Nausea And Vomiting  . Shellfish Allergy   . Sulfamethoxazole-Trimethoprim     Causes SOB and flu like symptoms  . Penicillins Rash    Has patient had a PCN reaction causing immediate rash, facial/tongue/throat swelling, SOB or lightheadedness with hypotension: No Has patient had a PCN reaction causing severe rash involving mucus membranes or skin necrosis: No Has patient had a PCN reaction that required hospitalization No Has patient had a PCN reaction occurring within the last 10 years: No If all of the above answers are "NO", then may proceed with Cephalosporin use.    Current Meds  Medication Sig  . Calcium-Vitamin D-Vitamin K (CALCIUM + D) [phone] MG-UNT-MCG CHEW Chew 1 tablet by mouth daily.  . Cholecalciferol (VITAMIN D) 1000 UNITS capsule Take 2,000 Units by mouth daily.   Marland Kitchen esomeprazole (NEXIUM) 40 MG capsule Take 1 capsule (40 mg total) by mouth 2 (two) times daily before a meal.  . hydrochlorothiazide (HYDRODIURIL) 25 MG tablet TAKE 1 TABLET(25 MG) BY MOUTH DAILY  . KRILL OIL PO Take 1 tablet by mouth daily.  Marland Kitchen losartan  (COZAAR) 100 MG tablet TAKE 1 TABLET(100 MG) BY MOUTH DAILY..  . metoprolol succinate (TOPROL-XL) 100 MG 24 hr tablet TAKE 1 TABLET BY MOUTH  TWICE DAILY WITH OR  IMMEDIATELY FOLLOWING MEAL.  . Probiotic Product (PROBIOTIC DAILY PO) Take 1 tablet by mouth daily.  . sucralfate (CARAFATE) 1 g tablet Take 1 tablet (1 g total) by mouth 4 (four) times daily -  with meals and at bedtime.  . valACYclovir (VALTREX) 1000 MG tablet Take 1,000 mg by mouth 2 (two) times daily as needed. For outbreaks  . [DISCONTINUED] esomeprazole (NEXIUM) 40 MG capsule Take 1 capsule (40 mg total) by mouth 2 (two) times daily before a meal.  . [DISCONTINUED] sucralfate (CARAFATE) 1 g tablet Take 1 tablet (1 g total) by mouth 4 (four) times daily -  with meals and at bedtime.   Past Medical History:  Diagnosis Date  . Anemia   . Arthritis   . Atherosclerosis of other specified arteries   . Bilateral carpal tunnel syndrome   . Cataract   . GERD (gastroesophageal reflux disease)   . Hemorrhoids   . Hiatal hernia   . Hypertension   . IBS (irritable bowel syndrome)   . Low back pain   . Osteopenia   . UTI (lower urinary tract infection)   . Venous insufficiency    Past Surgical History:  Procedure Laterality Date  . ABDOMINAL HYSTERECTOMY    .  bilateral carpal tunnel release  01/2008 and 06/2005   Dr. Daylene Katayama  . BREAST REDUCTION SURGERY  1999  . COLONOSCOPY  12/29/2016   per Dr. Carlean Purl, hemorrhoids only, no polyps, repeat in 10 yrs   . ENDOVENOUS ABLATION SAPHENOUS VEIN W/ LASER  12-15-2011   right greater saphenous vein by Curt Jews MD  . ENDOVENOUS ABLATION SAPHENOUS VEIN W/ LASER  01-26-2012   left greater saphenous vein by Curt Jews MD  . ESOPHAGOGASTRODUODENOSCOPY  12/29/2016   per Dr. Carlean Purl, gastritis   . hemorrhoid sugery  04/2009   Dr. Dalbert Batman  . left cataract and lens  07/2008   Dr. 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   Review of Systems As above  Objective:   Physical Exam BP 112/62 (BP Location: Left Arm, Patient Position: Sitting, Cuff Size: Normal)   Pulse 65   Ht 5\' 2"  (1.575 m)   Wt 151 lb (68.5 kg)   SpO2 96%   BMI 27.62 kg/m

## 2018-11-06 NOTE — Assessment & Plan Note (Addendum)
Stable EGD 2018 ok Continue esomeprazole +/- sucralfate See me prn

## 2018-11-06 NOTE — Patient Instructions (Addendum)
It was good to see you today.  Your stomach will likely continue to bother you off and on. I wish I could make it go away forever but taking the generic Nexium and the sucralfate as you are (ok to use off and on) is the way to go.  If something changes or you have any ?'s do not hesitate to ask.  I appreciate the opportunity to care for you. Gatha Mayer, MD, [facility]

## 2018-11-19 ENCOUNTER — Encounter: Payer: Self-pay | Admitting: Family Medicine

## 2018-11-19 ENCOUNTER — Ambulatory Visit (INDEPENDENT_AMBULATORY_CARE_PROVIDER_SITE_OTHER): Payer: Medicare Other | Admitting: Family Medicine

## 2018-11-19 VITALS — BP 120/64 | HR 60 | Temp 98.1°F | Ht 62.0 in | Wt 150.6 lb

## 2018-11-19 DIAGNOSIS — R739 Hyperglycemia, unspecified: Secondary | ICD-10-CM | POA: Diagnosis not present

## 2018-11-19 DIAGNOSIS — Z0000 Encounter for general adult medical examination without abnormal findings: Secondary | ICD-10-CM | POA: Diagnosis not present

## 2018-11-19 DIAGNOSIS — E559 Vitamin D deficiency, unspecified: Secondary | ICD-10-CM

## 2018-11-19 LAB — CBC WITH DIFFERENTIAL/PLATELET
Basophils Absolute: 0 10*3/uL (ref 0.0–0.1)
Basophils Relative: 0.8 % (ref 0.0–3.0)
Eosinophils Absolute: 0.1 10*3/uL (ref 0.0–0.7)
Eosinophils Relative: 2.8 % (ref 0.0–5.0)
HCT: 37.8 % (ref 36.0–46.0)
Hemoglobin: 12.7 g/dL (ref 12.0–15.0)
Lymphocytes Relative: 36.8 % (ref 12.0–46.0)
Lymphs Abs: 1.4 10*3/uL (ref 0.7–4.0)
MCHC: 33.5 g/dL (ref 30.0–36.0)
MCV: 92.9 fl (ref 78.0–100.0)
Monocytes Absolute: 0.3 10*3/uL (ref 0.1–1.0)
Monocytes Relative: 8.8 % (ref 3.0–12.0)
Neutro Abs: 1.9 10*3/uL (ref 1.4–7.7)
Neutrophils Relative %: 50.8 % (ref 43.0–77.0)
Platelets: 199 10*3/uL (ref 150.0–400.0)
RBC: 4.07 Mil/uL (ref 3.87–5.11)
RDW: 12.2 % (ref 11.5–15.5)
WBC: 3.8 10*3/uL — ABNORMAL LOW (ref 4.0–10.5)

## 2018-11-19 LAB — LIPID PANEL
Cholesterol: 190 mg/dL (ref 0–200)
HDL: 41.7 mg/dL (ref >39.00)
LDL Cholesterol: 115 mg/dL — ABNORMAL HIGH (ref 0–99)
NonHDL: 148.14
Total CHOL/HDL Ratio: 5
Triglycerides: 165 mg/dL — ABNORMAL HIGH (ref 0.0–149.0)
VLDL: 33 mg/dL (ref 0.0–40.0)

## 2018-11-19 LAB — TSH: TSH: 1.56 u[IU]/mL (ref 0.35–4.50)

## 2018-11-19 LAB — HEPATIC FUNCTION PANEL
ALT: 19 U/L (ref 0–35)
AST: 24 U/L (ref 0–37)
Albumin: 4.6 g/dL (ref 3.5–5.2)
Alkaline Phosphatase: 52 U/L (ref 39–117)
Bilirubin, Direct: 0.1 mg/dL (ref 0.0–0.3)
Total Bilirubin: 0.9 mg/dL (ref 0.2–1.2)
Total Protein: 7.1 g/dL (ref 6.0–8.3)

## 2018-11-19 LAB — POC URINALSYSI DIPSTICK (AUTOMATED)
Bilirubin, UA: NEGATIVE
Blood, UA: NEGATIVE
Glucose, UA: NEGATIVE
Ketones, UA: NEGATIVE
Leukocytes, UA: NEGATIVE
Nitrite, UA: NEGATIVE
Protein, UA: NEGATIVE
Spec Grav, UA: 1.015 (ref 1.010–1.025)
Urobilinogen, UA: 0.2 E.U./dL
pH, UA: 7 (ref 5.0–8.0)

## 2018-11-19 LAB — BASIC METABOLIC PANEL
BUN: 23 mg/dL (ref 6–23)
CO2: 30 mEq/L (ref 19–32)
Calcium: 9.8 mg/dL (ref 8.4–10.5)
Chloride: 103 mEq/L (ref 96–112)
Creatinine, Ser: 1.13 mg/dL (ref 0.40–1.20)
GFR: 48.02 mL/min — ABNORMAL LOW (ref >60.00)
Glucose, Bld: 95 mg/dL (ref 70–99)
Potassium: 4.6 mEq/L (ref 3.5–5.1)
Sodium: 140 mEq/L (ref 135–145)

## 2018-11-19 LAB — VITAMIN D 25 HYDROXY (VIT D DEFICIENCY, FRACTURES): VITD: 69.43 ng/mL (ref 30.00–100.00)

## 2018-11-19 LAB — HEMOGLOBIN A1C: Hgb A1c MFr Bld: 5.8 % (ref 4.6–6.5)

## 2018-11-19 NOTE — Progress Notes (Signed)
   Subjective:    Patient ID: Kaysha Parsell, female    DOB: [birth year], 68 y.o.   MRN: [id number]  HPI Here for a well exam. She feels fine and stays active. She enjoys hiking and playing golf.    Review of Systems  Constitutional: Negative.   HENT: Negative.   Eyes: Negative.   Respiratory: Negative.   Cardiovascular: Negative.   Gastrointestinal: Negative.   Genitourinary: Negative for decreased urine volume, difficulty urinating, dyspareunia, dysuria, enuresis, flank pain, frequency, hematuria, pelvic pain and urgency.  Musculoskeletal: Negative.   Skin: Negative.   Neurological: Negative.   Psychiatric/Behavioral: Negative.        Objective:   Physical Exam Constitutional:      General: She is not in acute distress.    Appearance: She is well-developed.  HENT:     Head: Normocephalic and atraumatic.     Right Ear: External ear normal.     Left Ear: External ear normal.     Nose: Nose normal.     Mouth/Throat:     Pharynx: No oropharyngeal exudate.  Eyes:     General: No scleral icterus.    Conjunctiva/sclera: Conjunctivae normal.     Pupils: Pupils are equal, round, and reactive to light.  Neck:     Musculoskeletal: Normal range of motion and neck supple.     Thyroid: No thyromegaly.     Vascular: No JVD.  Cardiovascular:     Rate and Rhythm: Normal rate and regular rhythm.     Heart sounds: Normal heart sounds. No murmur. No friction rub. No gallop.   Pulmonary:     Effort: Pulmonary effort is normal. No respiratory distress.     Breath sounds: Normal breath sounds. No wheezing or rales.  Chest:     Chest wall: No tenderness.  Abdominal:     General: Bowel sounds are normal. There is no distension.     Palpations: Abdomen is soft. There is no mass.     Tenderness: There is no abdominal tenderness. There is no guarding or rebound.  Musculoskeletal: Normal range of motion.        General: No tenderness.  Lymphadenopathy:     Cervical: No cervical adenopathy.   Skin:    General: Skin is warm and dry.     Findings: No erythema or rash.  Neurological:     Mental Status: She is alert and oriented to person, place, and time.     Cranial Nerves: No cranial nerve deficit.     Motor: No abnormal muscle tone.     Coordination: Coordination normal.     Deep Tendon Reflexes: Reflexes are normal and symmetric. Reflexes normal.  Psychiatric:        Behavior: Behavior normal.        Thought Content: Thought content normal.        Judgment: Judgment normal.           Assessment & Plan:  Well exam. We discussed diet and exercise. Get fasting labs.  Alysia Penna, MD

## 2018-12-11 DIAGNOSIS — Z961 Presence of intraocular lens: Secondary | ICD-10-CM | POA: Diagnosis not present

## 2018-12-11 DIAGNOSIS — H5212 Myopia, left eye: Secondary | ICD-10-CM | POA: Diagnosis not present

## 2019-03-11 ENCOUNTER — Other Ambulatory Visit: Payer: Self-pay

## 2019-03-11 ENCOUNTER — Encounter: Payer: Self-pay | Admitting: Family Medicine

## 2019-03-11 ENCOUNTER — Telehealth (INDEPENDENT_AMBULATORY_CARE_PROVIDER_SITE_OTHER): Payer: Medicare Other | Admitting: Family Medicine

## 2019-03-11 DIAGNOSIS — R300 Dysuria: Secondary | ICD-10-CM | POA: Diagnosis not present

## 2019-03-11 DIAGNOSIS — R739 Hyperglycemia, unspecified: Secondary | ICD-10-CM | POA: Diagnosis not present

## 2019-03-11 NOTE — Progress Notes (Signed)
Virtual Visit via Video Note  I connected with the patient on 03/11/19 at  3:00 PM EST by a video enabled telemedicine application and verified that I am speaking with the correct person using two identifiers.  Location patient: home Location provider:work or home office Persons participating in the virtual visit: patient, provider  I discussed the limitations of evaluation and management by telemedicine and the availability of in person appointments. The patient expressed understanding and agreed to proceed.   HPI: Here for 3 days of urgency to urinate and a foul odor to the urine. No fever or nausea. She has some low back pain but that is not unusual for her. She does drink lots of water every day.    ROS: See pertinent positives and negatives per HPI.  Past Medical History:  Diagnosis Date  . Anemia   . Arthritis   . Atherosclerosis of other specified arteries   . Bilateral carpal tunnel syndrome   . Cataract   . GERD (gastroesophageal reflux disease)   . Hemorrhoids   . Hiatal hernia   . Hypertension   . IBS (irritable bowel syndrome)   . Low back pain   . Osteopenia   . UTI (lower urinary tract infection)   . Venous insufficiency     Past Surgical History:  Procedure Laterality Date  . ABDOMINAL HYSTERECTOMY    . bilateral carpal tunnel release  01/2008 and 06/2005   Dr. Daylene Katayama  . BREAST REDUCTION SURGERY  1999  . COLONOSCOPY  12/29/2016   per Dr. Carlean Purl, hemorrhoids only, no polyps, repeat in 10 yrs   . ENDOVENOUS ABLATION SAPHENOUS VEIN W/ LASER  12-15-2011   right greater saphenous vein by Curt Jews MD  . ENDOVENOUS ABLATION SAPHENOUS VEIN W/ LASER  01-26-2012   left greater saphenous vein by Curt Jews MD  . ESOPHAGOGASTRODUODENOSCOPY  12/29/2016   per Dr. Carlean Purl, gastritis   . hemorrhoid sugery  04/2009   Dr. Dalbert Batman  . left cataract and lens  07/2008   Dr. Ellie Lunch    Family History  Problem Relation Age of Onset  . Stroke Mother   . COPD Father   .  Lung cancer Sister        smoked  . Colon cancer Neg Hx   . Esophageal cancer Neg Hx   . Pancreatic cancer Neg Hx   . Rectal cancer Neg Hx   . Stomach cancer Neg Hx      Current Outpatient Medications:  .  amLODipine (NORVASC) 5 MG tablet, TAKE 1 TABLET BY MOUTH  DAILY, Disp: 90 tablet, Rfl: 1 .  Calcium-Vitamin D-Vitamin K (CALCIUM + D) [phone] MG-UNT-MCG CHEW, Chew 1 tablet by mouth daily., Disp: , Rfl:  .  Cholecalciferol (VITAMIN D) 1000 UNITS capsule, Take 2,000 Units by mouth daily. , Disp: , Rfl:  .  esomeprazole (NEXIUM) 40 MG capsule, Take 1 capsule (40 mg total) by mouth 2 (two) times daily before a meal., Disp: 180 capsule, Rfl: 3 .  hydrochlorothiazide (HYDRODIURIL) 25 MG tablet, TAKE 1 TABLET(25 MG) BY MOUTH DAILY, Disp: 90 tablet, Rfl: 3 .  HYDROcodone-homatropine (HYDROMET) 5-1.5 MG/5ML syrup, Take 5 mLs by mouth every 4 (four) hours as needed., Disp: 240 mL, Rfl: 0 .  KRILL OIL PO, Take 1 tablet by mouth daily., Disp: , Rfl:  .  losartan (COZAAR) 100 MG tablet, TAKE 1 TABLET(100 MG) BY MOUTH DAILY.., Disp: 90 tablet, Rfl: 3 .  metoprolol succinate (TOPROL-XL) 100 MG 24 hr tablet, TAKE 1  TABLET BY MOUTH  TWICE DAILY WITH OR  IMMEDIATELY FOLLOWING MEAL., Disp: 180 tablet, Rfl: 1 .  Probiotic Product (PROBIOTIC DAILY PO), Take 1 tablet by mouth daily., Disp: , Rfl:  .  sucralfate (CARAFATE) 1 g tablet, Take 1 tablet (1 g total) by mouth 4 (four) times daily -  with meals and at bedtime., Disp: 360 tablet, Rfl: 3 .  valACYclovir (VALTREX) 1000 MG tablet, Take 1,000 mg by mouth 2 (two) times daily as needed. For outbreaks, Disp: , Rfl: 3  EXAM:  VITALS per patient if applicable:  GENERAL: alert, oriented, appears well and in no acute distress  HEENT: atraumatic, conjunttiva clear, no obvious abnormalities on inspection of external nose and ears  NECK: normal movements of the head and neck  LUNGS: on inspection no signs of respiratory distress, breathing rate appears  normal, no obvious gross SOB, gasping or wheezing  CV: no obvious cyanosis  MS: moves all visible extremities without noticeable abnormality  PSYCH/NEURO: pleasant and cooperative, no obvious depression or anxiety, speech and thought processing grossly intact  ASSESSMENT AND PLAN: Possible UTI. She will come by the lab tomorrow am to leave a urine sample.  Alysia Penna, MD  Discussed the following assessment and plan:  Hyperglycemia - Plan: A1C/Hgb A1C (Glycohemoglobin)  Dysuria - Plan: Urine Culture, Urinalysis     I discussed the assessment and treatment plan with the patient. The patient was provided an opportunity to ask questions and all were answered. The patient agreed with the plan and demonstrated an understanding of the instructions.   The patient was advised to call back or seek an in-person evaluation if the symptoms worsen or if the condition fails to improve as anticipated.

## 2019-03-12 ENCOUNTER — Other Ambulatory Visit (INDEPENDENT_AMBULATORY_CARE_PROVIDER_SITE_OTHER): Payer: Medicare Other

## 2019-03-12 ENCOUNTER — Other Ambulatory Visit: Payer: Self-pay

## 2019-03-12 DIAGNOSIS — R300 Dysuria: Secondary | ICD-10-CM | POA: Diagnosis not present

## 2019-03-12 DIAGNOSIS — R739 Hyperglycemia, unspecified: Secondary | ICD-10-CM

## 2019-03-12 LAB — URINALYSIS, ROUTINE W REFLEX MICROSCOPIC
Bilirubin Urine: NEGATIVE
Hgb urine dipstick: NEGATIVE
Ketones, ur: NEGATIVE
Nitrite: POSITIVE — AB
Specific Gravity, Urine: 1.01 (ref 1.000–1.030)
Total Protein, Urine: NEGATIVE
Urine Glucose: NEGATIVE
Urobilinogen, UA: 0.2 (ref 0.0–1.0)
pH: 7 (ref 5.0–8.0)

## 2019-03-12 LAB — HEMOGLOBIN A1C: Hgb A1c MFr Bld: 5.7 % (ref 4.6–6.5)

## 2019-03-14 LAB — URINE CULTURE
MICRO NUMBER:: 1084375
SPECIMEN QUALITY:: ADEQUATE

## 2019-03-15 MED ORDER — NITROFURANTOIN MONOHYD MACRO 100 MG PO CAPS: 100.0000 mg | ORAL_CAPSULE | Freq: Two times a day (BID) | ORAL | 0 refills | Status: DC

## 2019-03-18 ENCOUNTER — Ambulatory Visit: Payer: Self-pay | Admitting: *Deleted

## 2019-03-18 ENCOUNTER — Ambulatory Visit: Payer: Self-pay

## 2019-03-18 NOTE — Telephone Encounter (Signed)
Patient started on Macrobid 100 mg twice/day. She took 1st dose Saturday morning and almost immediately experienced chills, fatigue and felt achy all over.Again, Saturday night and Sunday after taking the Macrobid she would feel the same way. Today she has not taken the medication and feels fine without any symptoms. Advised to stop taking the Macrobid, Drink a lot of water today. She continues to have frequency at night and the feeling her bladder is not emptying. Denies fever/SOB/rash.  Routing to PCP for further advice.  Pharmacy [facility] on file.  Reason for Disposition . [1] Caller has URGENT medication question about med that PCP or specialist prescribed AND [2] triager unable to answer question  Answer Assessment - Initial Assessment Questions 1.   NAME of MEDICATION: "What medicine are you calling about?"     macrobid for dysuria. Started taking on Saturday, 03/16/19 2.   QUESTION: "What is your question?"     Causing chills body aches, tiredness. 3.   PRESCRIBING HCP: "Who prescribed it?" Reason: if prescribed by specialist, call should be referred to that group.     Dr. Sarajane Jews 4. SYMPTOMS: "Do you have any symptoms?"    Chills tiredness aches 5. SEVERITY: If symptoms are present, ask "Are they mild, moderate or severe?"     Mild to moderate 6.  PREGNANCY:  "Is there any chance that you are pregnant?" "When was your last menstrual period?"     na  Protocols used: MEDICATION QUESTION CALL-A-AH

## 2019-03-18 NOTE — Telephone Encounter (Signed)
Possible reaction to Macrobid. Please advise

## 2019-03-18 NOTE — Telephone Encounter (Signed)
Patient called stating that she has been on Macobid for UTI.  She started to have symptoms of fever, chills tiredness  She feels that she is having a reaction to medication and has waited today for Dr Sarajane Jews to return call. Triage called on call Dr Sharlet Salina. She instructed me to tell patient to remain off the medication until she talkes with Dr fry when open tomorrow.  She was also instructed to quarantine per Dr Sharlet Salina because symptoms a similar to COVID-19. Patient verbalized understanding of all instructions but insists that she des not have COVID-19 symptoms. Symptoms have gone with stopping the medication today. Patient was instructed to call early tomorrow for further advice from Dr Sarajane Jews.  Answer Assessment - Initial Assessment Questions 1.   NAME of MEDICATION: "What medicine are you calling about?"     macribid 2.   QUESTION: "What is your question?"     Having fever chills after taking medication 3.   PRESCRIBING HCP: "Who prescribed it?" Reason: if prescribed by specialist, call should be referred to that group.     Fry 4. SYMPTOMS: "Do you have any symptoms?"     Chills body ache fever tired 5. SEVERITY: If symptoms are present, ask "Are they mild, moderate or severe?"     mild 6.  PREGNANCY:  "Is there any chance that you are pregnant?" "When was your last menstrual period?"    N/A  Protocols used: MEDICATION QUESTION CALL-A-AH

## 2019-03-19 ENCOUNTER — Telehealth: Payer: Self-pay | Admitting: Family Medicine

## 2019-03-19 ENCOUNTER — Other Ambulatory Visit: Payer: Self-pay

## 2019-03-19 MED ORDER — CEPHALEXIN 500 MG PO CAPS: 500.0000 mg | ORAL_CAPSULE | Freq: Three times a day (TID) | ORAL | 0 refills | Status: DC

## 2019-03-19 NOTE — Telephone Encounter (Signed)
error 

## 2019-03-19 NOTE — Addendum Note (Signed)
Addended by: Gwenyth Ober R on: 03/19/2019 11:45 AM   Modules accepted: Orders

## 2019-03-19 NOTE — Telephone Encounter (Signed)
Pt notified of update. Pt stated that she wanted to one in a week after taking Rx so that she can do a f/u on urine. Pt has been scheduled. Macrobid added to pt allergy list.

## 2019-03-19 NOTE — Telephone Encounter (Signed)
I agree with stopping the Macrobid. Instead call in Keflex 500 mg TID for 7 days

## 2019-03-19 NOTE — Telephone Encounter (Signed)
Copied from [place] [id number]. Topic: General - Other >> Mar 19, 2019  8:16 AM Keene Breath wrote: Reason for CRM: Patient called yesterday regarding her reaction to Microbid for a UTI.  She did not get a call back from the doctor for another antibiotic and is still is in pain.  Please advise and call patient to discuss

## 2019-03-26 ENCOUNTER — Other Ambulatory Visit: Payer: Self-pay | Admitting: Family Medicine

## 2019-04-01 ENCOUNTER — Other Ambulatory Visit (INDEPENDENT_AMBULATORY_CARE_PROVIDER_SITE_OTHER): Payer: Medicare Other

## 2019-04-01 ENCOUNTER — Other Ambulatory Visit: Payer: Self-pay

## 2019-04-01 DIAGNOSIS — R300 Dysuria: Secondary | ICD-10-CM

## 2019-04-01 LAB — POCT URINALYSIS DIPSTICK
Glucose, UA: NEGATIVE
Leukocytes, UA: NEGATIVE
Protein, UA: NEGATIVE
Spec Grav, UA: 1.02 (ref 1.010–1.025)
Urobilinogen, UA: 0.2 E.U./dL
pH, UA: 6.5 (ref 5.0–8.0)

## 2019-04-02 ENCOUNTER — Encounter: Payer: Self-pay | Admitting: Family Medicine

## 2019-04-02 ENCOUNTER — Ambulatory Visit (INDEPENDENT_AMBULATORY_CARE_PROVIDER_SITE_OTHER): Payer: Medicare Other

## 2019-04-02 ENCOUNTER — Ambulatory Visit (INDEPENDENT_AMBULATORY_CARE_PROVIDER_SITE_OTHER): Payer: Medicare Other | Admitting: Family Medicine

## 2019-04-02 VITALS — BP 160/80 | HR 57 | Temp 97.6°F | Ht 62.0 in | Wt 149.0 lb

## 2019-04-02 DIAGNOSIS — Z87440 Personal history of urinary (tract) infections: Secondary | ICD-10-CM | POA: Diagnosis not present

## 2019-04-02 DIAGNOSIS — G8929 Other chronic pain: Secondary | ICD-10-CM

## 2019-04-02 DIAGNOSIS — M546 Pain in thoracic spine: Secondary | ICD-10-CM | POA: Diagnosis not present

## 2019-04-02 DIAGNOSIS — M545 Low back pain: Secondary | ICD-10-CM | POA: Diagnosis not present

## 2019-04-02 NOTE — Addendum Note (Signed)
Addended by: Suzette Battiest on: 04/02/2019 10:17 AM   Modules accepted: Orders

## 2019-04-02 NOTE — Progress Notes (Signed)
   Subjective:    Patient ID: [name], female    DOB: 10-12-1950, 68 y.o.   MRN: [id number]  HPI Here for recurrent low back pain. She was seen here in early November for urinary urgency and she was given Macrobid. She stopped taking this after 2 days because it made her itch, so we then gave her 10 days of Keflex. She was able to take this, and her urinary symptoms resolved. Then the low back pain returned about a week ago, but this time she has no urinary symptoms. No fever or nausea. She takes Tylenol for the back pain and this gives her reasonable relief. She also does stretching exercises and yoga at home. She had an evaluation for low back pain in 1997 with Dr. Charise Killian and this included an MRI scan. I do not have access to these results, but from her description of what she was told, it sounds like this showed only degenerative discs. She came by the lab yesterday and the UA was clear. A culture is still pending.    Review of Systems  Constitutional: Negative.   Respiratory: Negative.   Cardiovascular: Negative.   Genitourinary: Negative.   Musculoskeletal: Positive for back pain.       Objective:   Physical Exam Constitutional:      General: She is not in acute distress.    Appearance: Normal appearance.  Cardiovascular:     Rate and Rhythm: Normal rate and regular rhythm.     Pulses: Normal pulses.     Heart sounds: Normal heart sounds.  Pulmonary:     Effort: Pulmonary effort is normal.     Breath sounds: Normal breath sounds.  Abdominal:     General: Abdomen is flat. Bowel sounds are normal. There is no distension.     Palpations: Abdomen is soft. There is no mass.     Tenderness: There is no abdominal tenderness. There is no guarding or rebound.     Hernia: No hernia is present.  Musculoskeletal:     Comments: She is fairly tender in the lower back around the junction of the thoracic and lumbar spines. ROM is limited by pain. Negative SLR   Neurological:   Mental Status: She is alert.           Assessment & Plan:  It seems that her UTI has resolved, but we will check the culture results. The back pain is spinal in origin. She can use Tylenol as needed. We will send her for Xrays of the thoracic and lumbar spines today.  Alysia Penna, MD

## 2019-04-03 LAB — URINE CULTURE
MICRO NUMBER:: 1146092
Result:: NO GROWTH
SPECIMEN QUALITY:: ADEQUATE

## 2019-04-09 ENCOUNTER — Telehealth: Payer: Self-pay | Admitting: *Deleted

## 2019-04-09 NOTE — Telephone Encounter (Signed)
Copied from [place] [id number]. Topic: General - Other >> Apr 09, 2019  2:21 PM Greggory Keen D wrote: Pt returned phone call to [facility].   [place] [phone]

## 2019-04-10 NOTE — Telephone Encounter (Signed)
This has been taking care of see lab result notes.

## 2019-04-12 DIAGNOSIS — M545 Low back pain: Secondary | ICD-10-CM | POA: Diagnosis not present

## 2019-04-12 DIAGNOSIS — M5136 Other intervertebral disc degeneration, lumbar region: Secondary | ICD-10-CM | POA: Diagnosis not present

## 2019-04-22 DIAGNOSIS — M545 Low back pain: Secondary | ICD-10-CM | POA: Diagnosis not present

## 2019-04-30 DIAGNOSIS — M545 Low back pain: Secondary | ICD-10-CM | POA: Diagnosis not present

## 2019-05-02 DIAGNOSIS — M5136 Other intervertebral disc degeneration, lumbar region: Secondary | ICD-10-CM | POA: Diagnosis not present

## 2019-05-02 DIAGNOSIS — M545 Low back pain: Secondary | ICD-10-CM | POA: Diagnosis not present

## 2019-05-07 DIAGNOSIS — M545 Low back pain: Secondary | ICD-10-CM | POA: Diagnosis not present

## 2019-05-15 DIAGNOSIS — R350 Frequency of micturition: Secondary | ICD-10-CM | POA: Diagnosis not present

## 2019-05-17 DIAGNOSIS — Z1231 Encounter for screening mammogram for malignant neoplasm of breast: Secondary | ICD-10-CM | POA: Diagnosis not present

## 2019-05-17 LAB — HM MAMMOGRAPHY

## 2019-05-28 ENCOUNTER — Encounter: Payer: Self-pay | Admitting: Family Medicine

## 2019-05-29 DIAGNOSIS — R350 Frequency of micturition: Secondary | ICD-10-CM | POA: Diagnosis not present

## 2019-05-29 DIAGNOSIS — N281 Cyst of kidney, acquired: Secondary | ICD-10-CM | POA: Diagnosis not present

## 2019-06-04 DIAGNOSIS — N281 Cyst of kidney, acquired: Secondary | ICD-10-CM | POA: Diagnosis not present

## 2019-06-23 ENCOUNTER — Ambulatory Visit (HOSPITAL_COMMUNITY)
Admission: EM | Admit: 2019-06-23 | Discharge: 2019-06-23 | Disposition: A | Discharge status: Home / Self Care | Payer: Medicare Other

## 2019-06-23 ENCOUNTER — Encounter (HOSPITAL_COMMUNITY): Payer: Self-pay

## 2019-06-23 ENCOUNTER — Other Ambulatory Visit: Payer: Self-pay

## 2019-06-23 DIAGNOSIS — T161XXA Foreign body in right ear, initial encounter: Secondary | ICD-10-CM | POA: Diagnosis not present

## 2019-06-23 HISTORY — DX: Disorder of kidney and ureter, unspecified: N28.9

## 2019-06-23 NOTE — Discharge Instructions (Addendum)
We removed the piece of wood from your ear Do not stick anything in the ear Follow up as needed for continued or worsening symptoms

## 2019-06-23 NOTE — ED Triage Notes (Signed)
Pt states she was cleaning her ears with a wooden ear curette and it broke off in her right ear this morning.

## 2019-06-23 NOTE — ED Notes (Signed)
Alligator forceps at bedside if needed

## 2019-06-23 NOTE — ED Provider Notes (Signed)
Trainer    CSN: [id number] Arrival date & time: 06/23/19  1243      History   Chief Complaint Chief Complaint  Patient presents with  . Foreign Body in [place] is a 69 y.o. female.   Patient is a 69 year old female presents today with foreign body in the right ear.  This occurred prior to arrival when she was using a wooden ear curette to scratch her in her ear.  This problem has been constant.  She denies any specific pain.  Denies any bleeding     Past Medical History:  Diagnosis Date  . Anemia   . Arthritis   . Atherosclerosis of other specified arteries   . Bilateral carpal tunnel syndrome   . Cataract   . GERD (gastroesophageal reflux disease)   . Hemorrhoids   . Hiatal hernia   . Hypertension   . IBS (irritable bowel syndrome)   . Low back pain   . Osteopenia   . Renal disorder   . UTI (lower urinary tract infection)   . Venous insufficiency     Patient Active Problem List   Diagnosis Date Noted  . Upper airway cough syndrome 10/19/2017  . Hyperglycemia 07/16/2015  . Borderline high cholesterol 04/23/2012  . Thyroid nodule 10/27/2011  . Varicose veins of lower extremities with other complications [id number]  . Low back pain 04/20/2011  . ATHEROSCLEROSIS OF OTHER SPECIFIED ARTERIES 10/26/2009  . OSTEOPENIA 04/14/2009  . Functional dyspepsia 01/09/2008  . IRRITABLE BOWEL SYNDROME 04/24/2007  . Essential hypertension 02/12/2007  . VENOUS INSUFFICIENCY 02/12/2007  . GERD 02/12/2007    Past Surgical History:  Procedure Laterality Date  . ABDOMINAL HYSTERECTOMY    . bilateral carpal tunnel release  01/2008 and 06/2005   Dr. Daylene Katayama  . BREAST REDUCTION SURGERY  1999  . COLONOSCOPY  12/29/2016   per Dr. Carlean Purl, hemorrhoids only, no polyps, repeat in 10 yrs   . ENDOVENOUS ABLATION SAPHENOUS VEIN W/ LASER  12-15-2011   right greater saphenous vein by Curt Jews MD  . ENDOVENOUS ABLATION SAPHENOUS VEIN W/ LASER  01-26-2012     left greater saphenous vein by Curt Jews MD  . ESOPHAGOGASTRODUODENOSCOPY  12/29/2016   per Dr. Carlean Purl, gastritis   . hemorrhoid sugery  04/2009   Dr. Dalbert Batman  . left cataract and lens  07/2008   Dr. Ellie Lunch    OB History   No obstetric history on file.      Home Medications    Prior to Admission medications   Medication Sig Start Date End Date Taking? Authorizing Provider  amLODipine (NORVASC) 5 MG tablet TAKE 1 TABLET BY MOUTH  DAILY 03/26/19  Yes [name], MD  ciprofloxacin (CIPRO) 250 MG tablet Take 250 mg by mouth 2 (two) times daily. 05/29/19  Yes [provider]  hydrochlorothiazide (HYDRODIURIL) 25 MG tablet TAKE 1 TABLET(25 MG) BY MOUTH DAILY 10/17/18  Yes [name], MD  losartan (COZAAR) 100 MG tablet TAKE 1 TABLET(100 MG) BY MOUTH DAILY.Marland Kitchen 10/17/18  Yes [name], MD  metoprolol succinate (TOPROL-XL) 100 MG 24 hr tablet TAKE 1 TABLET BY MOUTH  TWICE DAILY WITH OR  IMMEDIATELY FOLLOWING MEAL. 03/26/19  Yes [name], MD  valACYclovir (VALTREX) 1000 MG tablet Take 1,000 mg by mouth 2 (two) times daily as needed. For outbreaks 06/03/14  Yes [provider]  Calcium-Vitamin D-Vitamin K (CALCIUM + D) [phone] MG-UNT-MCG CHEW Chew 1 tablet by  mouth daily.    [provider]  Cholecalciferol (VITAMIN D) 1000 UNITS capsule Take 2,000 Units by mouth daily.     [provider]  esomeprazole (NEXIUM) 40 MG capsule Take 1 capsule (40 mg total) by mouth 2 (two) times daily before a meal. 11/06/18   Gatha Mayer, MD  KRILL OIL PO Take 1 tablet by mouth daily.    [provider]  Probiotic Product (PROBIOTIC DAILY PO) Take 1 tablet by mouth daily.    [provider]  sucralfate (CARAFATE) 1 g tablet Take 1 tablet (1 g total) by mouth 4 (four) times daily -  with meals and at bedtime. 11/06/18   Gatha Mayer, MD    Family History Family History  Problem Relation Age of Onset  . Stroke Mother   . COPD Father    . Lung cancer Sister        smoked  . Colon cancer Neg Hx   . Esophageal cancer Neg Hx   . Pancreatic cancer Neg Hx   . Rectal cancer Neg Hx   . Stomach cancer Neg Hx     Social History Social History   Tobacco Use  . Smoking status: Never Smoker  . Smokeless tobacco: Never Used  Substance Use Topics  . Alcohol use: No    Alcohol/week: 0.0 standard drinks  . Drug use: No     Allergies   Tramadol, Fish allergy, Lansoprazole, Levaquin [levofloxacin in d5w], Macrobid [nitrofurantoin macrocrystal], Shellfish allergy, Sulfamethoxazole-trimethoprim, and Penicillins   Review of Systems Review of Systems   Physical Exam Triage Vital Signs ED Triage Vitals [06/23/19 1329]  Enc Vitals Group     BP (!) 150/75     Pulse Rate 65     Resp 18     Temp 98.3 F (36.8 C)     Temp Source Oral     SpO2 98 %     Weight      Height      Head Circumference      Peak Flow      Pain Score 2     Pain Loc      Pain Edu?      Excl. in [place]?    No data found.  Updated Vital Signs BP (!) 150/75 (BP Location: Left Arm)   Pulse 65   Temp 98.3 F (36.8 C) (Oral)   Resp 18   SpO2 98%   Visual Acuity Right Eye Distance:   Left Eye Distance:   Bilateral Distance:    Right Eye Near:   Left Eye Near:    Bilateral Near:     Physical Exam Vitals and nursing note reviewed.  Constitutional:      General: She is not in acute distress.    Appearance: Normal appearance. She is not ill-appearing, toxic-appearing or diaphoretic.  HENT:     Head: Normocephalic.     Comments: Small sliver removed from inner ear canal    Right Ear: Tympanic membrane normal.     Left Ear: Tympanic membrane normal.     Nose: Nose normal.  Eyes:     Conjunctiva/sclera: Conjunctivae normal.  Pulmonary:     Effort: Pulmonary effort is normal.  Musculoskeletal:        General: Normal range of motion.     Cervical back: Normal range of motion.  Skin:    General: Skin is warm and dry.     Findings: No  rash.  Neurological:  Mental Status: She is alert.  Psychiatric:        Mood and Affect: Mood normal.      UC Treatments / Results  Labs (all labs ordered are listed, but only abnormal results are displayed) Labs Reviewed - No data to display  EKG   Radiology No results found.  Procedures Procedures (including critical care time)  Medications Ordered in UC Medications - No data to display  Initial Impression / Assessment and Plan / UC Course  I have reviewed the triage vital signs and the nursing notes.  Pertinent labs & imaging results that were available during my care of the patient were reviewed by me and considered in my medical decision making (see chart for details).     Foreign body in the right ear-small sliver removed Patient tolerated well. TM normal Recommended not taking foreign objects in the ear Follow up as needed for continued or worsening symptoms  Final Clinical Impressions(s) / UC Diagnoses   Final diagnoses:  Foreign body of right ear, initial encounter     Discharge Instructions     We removed the piece of wood from your ear Do not stick anything in the ear Follow up as needed for continued or worsening symptoms     ED Prescriptions    None     PDMP not reviewed this encounter.   Orvan July, NP 06/23/19 1542

## 2019-07-10 DIAGNOSIS — N3020 Other chronic cystitis without hematuria: Secondary | ICD-10-CM | POA: Diagnosis not present

## 2019-07-10 DIAGNOSIS — R350 Frequency of micturition: Secondary | ICD-10-CM | POA: Diagnosis not present

## 2019-07-22 DIAGNOSIS — M8580 Other specified disorders of bone density and structure, unspecified site: Secondary | ICD-10-CM | POA: Diagnosis not present

## 2019-07-22 DIAGNOSIS — N952 Postmenopausal atrophic vaginitis: Secondary | ICD-10-CM | POA: Diagnosis not present

## 2019-07-22 DIAGNOSIS — R300 Dysuria: Secondary | ICD-10-CM | POA: Diagnosis not present

## 2019-08-02 DIAGNOSIS — M8589 Other specified disorders of bone density and structure, multiple sites: Secondary | ICD-10-CM | POA: Diagnosis not present

## 2019-08-12 DIAGNOSIS — N390 Urinary tract infection, site not specified: Secondary | ICD-10-CM | POA: Diagnosis not present

## 2019-08-20 DIAGNOSIS — M8580 Other specified disorders of bone density and structure, unspecified site: Secondary | ICD-10-CM | POA: Diagnosis not present

## 2019-09-02 DIAGNOSIS — M13849 Other specified arthritis, unspecified hand: Secondary | ICD-10-CM | POA: Diagnosis not present

## 2019-09-02 DIAGNOSIS — R2231 Localized swelling, mass and lump, right upper limb: Secondary | ICD-10-CM | POA: Diagnosis not present

## 2019-09-02 DIAGNOSIS — M79644 Pain in right finger(s): Secondary | ICD-10-CM | POA: Diagnosis not present

## 2019-09-02 DIAGNOSIS — M654 Radial styloid tenosynovitis [de Quervain]: Secondary | ICD-10-CM | POA: Diagnosis not present

## 2019-10-01 DIAGNOSIS — N952 Postmenopausal atrophic vaginitis: Secondary | ICD-10-CM | POA: Diagnosis not present

## 2019-10-01 DIAGNOSIS — R300 Dysuria: Secondary | ICD-10-CM | POA: Diagnosis not present

## 2019-10-11 DIAGNOSIS — N390 Urinary tract infection, site not specified: Secondary | ICD-10-CM | POA: Diagnosis not present

## 2019-10-28 ENCOUNTER — Other Ambulatory Visit: Payer: Self-pay | Admitting: Family Medicine

## 2019-10-28 NOTE — Telephone Encounter (Signed)
Please advise. HCTZ is not on the current med list.

## 2019-10-31 ENCOUNTER — Ambulatory Visit (INDEPENDENT_AMBULATORY_CARE_PROVIDER_SITE_OTHER): Payer: Medicare Other | Admitting: Family Medicine

## 2019-10-31 ENCOUNTER — Other Ambulatory Visit: Payer: Self-pay

## 2019-10-31 ENCOUNTER — Encounter: Payer: Self-pay | Admitting: Family Medicine

## 2019-10-31 VITALS — BP 122/62 | HR 68 | Temp 97.6°F | Wt 149.6 lb

## 2019-10-31 DIAGNOSIS — I83893 Varicose veins of bilateral lower extremities with other complications: Secondary | ICD-10-CM

## 2019-10-31 DIAGNOSIS — R739 Hyperglycemia, unspecified: Secondary | ICD-10-CM

## 2019-10-31 DIAGNOSIS — I10 Essential (primary) hypertension: Secondary | ICD-10-CM

## 2019-10-31 DIAGNOSIS — G8929 Other chronic pain: Secondary | ICD-10-CM

## 2019-10-31 DIAGNOSIS — M545 Low back pain, unspecified: Secondary | ICD-10-CM

## 2019-10-31 DIAGNOSIS — K219 Gastro-esophageal reflux disease without esophagitis: Secondary | ICD-10-CM

## 2019-10-31 LAB — CBC WITH DIFFERENTIAL/PLATELET
Basophils Absolute: 0 10*3/uL (ref 0.0–0.1)
Basophils Relative: 1.3 % (ref 0.0–3.0)
Eosinophils Absolute: 0.1 10*3/uL (ref 0.0–0.7)
Eosinophils Relative: 2.7 % (ref 0.0–5.0)
HCT: 36.9 % (ref 36.0–46.0)
Hemoglobin: 12.8 g/dL (ref 12.0–15.0)
Lymphocytes Relative: 41.6 % (ref 12.0–46.0)
Lymphs Abs: 1.4 10*3/uL (ref 0.7–4.0)
MCHC: 34.7 g/dL (ref 30.0–36.0)
MCV: 93.9 fl (ref 78.0–100.0)
Monocytes Absolute: 0.3 10*3/uL (ref 0.1–1.0)
Monocytes Relative: 8.1 % (ref 3.0–12.0)
Neutro Abs: 1.6 10*3/uL (ref 1.4–7.7)
Neutrophils Relative %: 46.3 % (ref 43.0–77.0)
Platelets: 184 10*3/uL (ref 150.0–400.0)
RBC: 3.93 Mil/uL (ref 3.87–5.11)
RDW: 11.7 % (ref 11.5–15.5)
WBC: 3.4 10*3/uL — ABNORMAL LOW (ref 4.0–10.5)

## 2019-10-31 LAB — LIPID PANEL
Cholesterol: 167 mg/dL (ref 0–200)
HDL: 40.5 mg/dL (ref >39.00)
LDL Cholesterol: 100 mg/dL — ABNORMAL HIGH (ref 0–99)
NonHDL: 126.43
Total CHOL/HDL Ratio: 4
Triglycerides: 134 mg/dL (ref 0.0–149.0)
VLDL: 26.8 mg/dL (ref 0.0–40.0)

## 2019-10-31 LAB — BASIC METABOLIC PANEL
BUN: 18 mg/dL (ref 6–23)
CO2: 31 mEq/L (ref 19–32)
Calcium: 9.6 mg/dL (ref 8.4–10.5)
Chloride: 103 mEq/L (ref 96–112)
Creatinine, Ser: 1.1 mg/dL (ref 0.40–1.20)
GFR: 49.4 mL/min — ABNORMAL LOW (ref >60.00)
Glucose, Bld: 97 mg/dL (ref 70–99)
Potassium: 3.9 mEq/L (ref 3.5–5.1)
Sodium: 142 mEq/L (ref 135–145)

## 2019-10-31 LAB — HEPATIC FUNCTION PANEL
ALT: 15 U/L (ref 0–35)
AST: 22 U/L (ref 0–37)
Albumin: 4.4 g/dL (ref 3.5–5.2)
Alkaline Phosphatase: 52 U/L (ref 39–117)
Bilirubin, Direct: 0.2 mg/dL (ref 0.0–0.3)
Total Bilirubin: 1 mg/dL (ref 0.2–1.2)
Total Protein: 7.1 g/dL (ref 6.0–8.3)

## 2019-10-31 LAB — HEMOGLOBIN A1C: Hgb A1c MFr Bld: 5.8 % (ref 4.6–6.5)

## 2019-10-31 LAB — TSH: TSH: 0.82 u[IU]/mL (ref 0.35–4.50)

## 2019-10-31 NOTE — Progress Notes (Signed)
   Subjective:    Patient ID: [name], female    DOB: April 26, 1951, 69 y.o.   MRN: [id number]  HPI Here to follow up on issues. She feels well in general. She still enjoys playing golf. In February her GYN referred her to Dr. Wendy Poet and he ordered a CT scan which reportedly showed bilateral renal cysts. He mentioned that she should see a nephrologist, but we never got any of these records. Her BP has been stable. Her GERD is well controlled.    Review of Systems  Constitutional: Negative.   HENT: Negative.   Eyes: Negative.   Respiratory: Negative.   Cardiovascular: Negative.   Gastrointestinal: Negative.   Genitourinary: Negative for decreased urine volume, difficulty urinating, dyspareunia, dysuria, enuresis, flank pain, frequency, hematuria, pelvic pain and urgency.  Musculoskeletal: Negative.   Skin: Negative.   Neurological: Negative.   Psychiatric/Behavioral: Negative.        Objective:   Physical Exam Constitutional:      General: She is not in acute distress.    Appearance: She is well-developed.  HENT:     Head: Normocephalic and atraumatic.     Right Ear: External ear normal.     Left Ear: External ear normal.     Nose: Nose normal.     Mouth/Throat:     Pharynx: No oropharyngeal exudate.  Eyes:     General: No scleral icterus.    Conjunctiva/sclera: Conjunctivae normal.     Pupils: Pupils are equal, round, and reactive to light.  Neck:     Thyroid: No thyromegaly.     Vascular: No JVD.  Cardiovascular:     Rate and Rhythm: Normal rate and regular rhythm.     Heart sounds: Normal heart sounds. No murmur heard.  No friction rub. No gallop.   Pulmonary:     Effort: Pulmonary effort is normal. No respiratory distress.     Breath sounds: Normal breath sounds. No wheezing or rales.  Chest:     Chest wall: No tenderness.  Abdominal:     General: Bowel sounds are normal. There is no distension.     Palpations: Abdomen is soft. There is no mass.      Tenderness: There is no abdominal tenderness. There is no guarding or rebound.  Musculoskeletal:        General: No tenderness. Normal range of motion.     Cervical back: Normal range of motion and neck supple.  Lymphadenopathy:     Cervical: No cervical adenopathy.  Skin:    General: Skin is warm and dry.     Findings: No erythema or rash.  Neurological:     Mental Status: She is alert and oriented to person, place, and time.     Cranial Nerves: No cranial nerve deficit.     Motor: No abnormal muscle tone.     Coordination: Coordination normal.     Deep Tendon Reflexes: Reflexes are normal and symmetric. Reflexes normal.  Psychiatric:        Behavior: Behavior normal.        Thought Content: Thought content normal.        Judgment: Judgment normal.           Assessment & Plan:  Her HTN and GERD are stable. Get fasting labs for an A1c and to check renal function. I asked her to have Dr. McDiarmid forward over all his records of her labs and scans.  Alysia Penna, MD

## 2019-12-11 DIAGNOSIS — Z20822 Contact with and (suspected) exposure to covid-19: Secondary | ICD-10-CM | POA: Diagnosis not present

## 2019-12-30 DIAGNOSIS — H5213 Myopia, bilateral: Secondary | ICD-10-CM | POA: Diagnosis not present

## 2019-12-30 DIAGNOSIS — Z961 Presence of intraocular lens: Secondary | ICD-10-CM | POA: Diagnosis not present

## 2020-01-12 ENCOUNTER — Other Ambulatory Visit: Payer: Self-pay | Admitting: Internal Medicine

## 2020-02-28 DIAGNOSIS — I129 Hypertensive chronic kidney disease with stage 1 through stage 4 chronic kidney disease, or unspecified chronic kidney disease: Secondary | ICD-10-CM | POA: Diagnosis not present

## 2020-02-28 DIAGNOSIS — N2881 Hypertrophy of kidney: Secondary | ICD-10-CM | POA: Diagnosis not present

## 2020-02-28 DIAGNOSIS — N1831 Chronic kidney disease, stage 3a: Secondary | ICD-10-CM | POA: Diagnosis not present

## 2020-02-28 DIAGNOSIS — K219 Gastro-esophageal reflux disease without esophagitis: Secondary | ICD-10-CM | POA: Diagnosis not present

## 2020-02-28 DIAGNOSIS — N2581 Secondary hyperparathyroidism of renal origin: Secondary | ICD-10-CM | POA: Diagnosis not present

## 2020-03-02 ENCOUNTER — Other Ambulatory Visit: Payer: Self-pay | Admitting: Nephrology

## 2020-03-02 DIAGNOSIS — N1831 Chronic kidney disease, stage 3a: Secondary | ICD-10-CM

## 2020-03-02 DIAGNOSIS — N2581 Secondary hyperparathyroidism of renal origin: Secondary | ICD-10-CM

## 2020-03-02 DIAGNOSIS — I129 Hypertensive chronic kidney disease with stage 1 through stage 4 chronic kidney disease, or unspecified chronic kidney disease: Secondary | ICD-10-CM

## 2020-03-09 ENCOUNTER — Ambulatory Visit
Admission: RE | Admit: 2020-03-09 | Discharge: 2020-03-09 | Disposition: A | Discharge status: Home / Self Care | Payer: Medicare Other | Source: Ambulatory Visit | Attending: Nephrology | Admitting: Nephrology

## 2020-03-09 DIAGNOSIS — N2581 Secondary hyperparathyroidism of renal origin: Secondary | ICD-10-CM

## 2020-03-09 DIAGNOSIS — I129 Hypertensive chronic kidney disease with stage 1 through stage 4 chronic kidney disease, or unspecified chronic kidney disease: Secondary | ICD-10-CM

## 2020-03-09 DIAGNOSIS — N1831 Chronic kidney disease, stage 3a: Secondary | ICD-10-CM

## 2020-03-09 DIAGNOSIS — I159 Secondary hypertension, unspecified: Secondary | ICD-10-CM | POA: Diagnosis not present

## 2020-03-09 DIAGNOSIS — N281 Cyst of kidney, acquired: Secondary | ICD-10-CM | POA: Diagnosis not present

## 2020-04-05 ENCOUNTER — Other Ambulatory Visit: Payer: Self-pay | Admitting: Family Medicine

## 2020-04-29 ENCOUNTER — Telehealth: Payer: Self-pay | Admitting: Family Medicine

## 2020-04-29 NOTE — Telephone Encounter (Signed)
Left message for patient to call back and schedule Medicare Annual Wellness Visit (AWV) either virtually or in office.   Last AWV no information please schedule at anytime with [facility] Nurse Health Advisor 1 or 2   This should be a 45 minute visit. 

## 2020-05-29 DIAGNOSIS — Z1231 Encounter for screening mammogram for malignant neoplasm of breast: Secondary | ICD-10-CM | POA: Diagnosis not present

## 2020-06-11 ENCOUNTER — Encounter: Payer: Self-pay | Admitting: Internal Medicine

## 2020-06-11 ENCOUNTER — Other Ambulatory Visit: Payer: Self-pay

## 2020-06-11 ENCOUNTER — Ambulatory Visit: Payer: Medicare Other | Admitting: Internal Medicine

## 2020-06-11 VITALS — BP 110/62 | HR 58 | Ht 62.0 in | Wt 149.0 lb

## 2020-06-11 DIAGNOSIS — G4700 Insomnia, unspecified: Secondary | ICD-10-CM

## 2020-06-11 DIAGNOSIS — R1013 Epigastric pain: Secondary | ICD-10-CM

## 2020-06-11 DIAGNOSIS — R6881 Early satiety: Secondary | ICD-10-CM | POA: Diagnosis not present

## 2020-06-11 NOTE — Patient Instructions (Addendum)
Normal BMI (Body Mass Index- based on height and weight) is between 23 and 30. Your BMI today is Body mass index is 27.25 kg/m. Marland Kitchen Please consider follow up  regarding your BMI with your Primary Care Provider.  You have been scheduled for an endoscopy. Please follow written instructions given to you at your visit today. If you use inhalers (even only as needed), please bring them with you on the day of your procedure.  Due to recent changes in healthcare laws, you may see the results of your imaging and laboratory studies on MyChart before your provider has had a chance to review them.  We understand that in some cases there may be results that are confusing or concerning to you. Not all laboratory results come back in the same time frame and the provider may be waiting for multiple results in order to interpret others.  Please give [place] 48 hours in order for your provider to thoroughly review all the results before contacting the office for clarification of your results.   Try Sleep 3 to hopefully help with your insomnia. Handout provided.   I appreciate the opportunity to care for you. Silvano Rusk, MD, [facility]
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
## 2020-06-12 ENCOUNTER — Encounter: Payer: Self-pay | Admitting: Internal Medicine

## 2020-06-12 ENCOUNTER — Ambulatory Visit (AMBULATORY_SURGERY_CENTER): Payer: Medicare Other | Admitting: Internal Medicine

## 2020-06-12 ENCOUNTER — Other Ambulatory Visit: Payer: Self-pay

## 2020-06-12 VITALS — BP 99/56 | HR 54 | Temp 97.2°F | Resp 16 | Ht 62.0 in | Wt 149.0 lb

## 2020-06-12 DIAGNOSIS — R1013 Epigastric pain: Secondary | ICD-10-CM | POA: Diagnosis not present

## 2020-06-12 DIAGNOSIS — K2970 Gastritis, unspecified, without bleeding: Secondary | ICD-10-CM | POA: Diagnosis not present

## 2020-06-12 DIAGNOSIS — K317 Polyp of stomach and duodenum: Secondary | ICD-10-CM | POA: Diagnosis not present

## 2020-06-12 DIAGNOSIS — D131 Benign neoplasm of stomach: Secondary | ICD-10-CM

## 2020-06-12 MED ORDER — SODIUM CHLORIDE 0.9 % IV SOLN: 500.0000 mL | Freq: Once | INTRAVENOUS | Status: DC

## 2020-06-12 NOTE — Progress Notes (Signed)
Medical history reviewed with no changes noted. VS assessed by C.W 

## 2020-06-12 NOTE — Op Note (Signed)
[facility] Patient Name: [name] Procedure Date: 06/12/2020 9:52 AM MRN: [id number] Endoscopist: Gatha Mayer , MD Age: 70 Referring MD:  Date of Birth: [birth year] Gender: Female Account #: [id number] Procedure:                Upper GI endoscopy Indications:              Epigastric abdominal pain, Dyspepsia Medicines:                Propofol per Anesthesia, Monitored Anesthesia Care Procedure:                Pre-Anesthesia Assessment:                           - Prior to the procedure, a History and Physical                            was performed, and patient medications and                            allergies were reviewed. The patient's tolerance of                            previous anesthesia was also reviewed. The risks                            and benefits of the procedure and the sedation                            options and risks were discussed with the patient.                            All questions were answered, and informed consent                            was obtained. Prior Anticoagulants: The patient has                            taken no previous anticoagulant or antiplatelet                            agents. ASA Grade Assessment: II - A patient with                            mild systemic disease. After reviewing the risks                            and benefits, the patient was deemed in                            satisfactory condition to undergo the procedure.                           After obtaining informed consent, the endoscope was  passed under direct vision. Throughout the                            procedure, the patient's blood pressure, pulse, and                            oxygen saturations were monitored continuously. The                            Endoscope was introduced through the mouth, and                            advanced to the second part of duodenum. The upper                             GI endoscopy was accomplished without difficulty.                            The patient tolerated the procedure well. Scope In: Scope Out: Findings:                 The examined esophagus was normal.                           Multiple diminutive sessile polyps were found in                            the gastric fundus and in the gastric body.                            Biopsies were taken with a cold forceps for                            histology. Verification of patient identification                            for the specimen was done. Estimated blood loss was                            minimal.                           Diffuse mild inflammation characterized by erythema                            and granularity was found in the gastric fundus and                            in the gastric body. Biopsies were taken with a                            cold forceps for histology. Verification of patient  identification for the specimen was done. Estimated                            blood loss was minimal.                           A single 5 to 8 mm sessile polyp was found in the                            second portion of the duodenum. Biopsies were taken                            with a cold forceps for histology. Verification of                            patient identification for the specimen was done.                            Estimated blood loss was minimal.                           The gastroesophageal flap valve was visualized                            endoscopically and classified as Hill Grade II                            (fold present, opens with respiration).                           The cardia and gastric fundus were otherwise normal                            on retroflexion. Complications:            No immediate complications. Estimated Blood Loss:     Estimated blood loss was minimal. Impression:               - Normal esophagus.                            - Multiple gastric polyps. Biopsied. Look like                            benign fundic gland polyps                           - Gastritis. Biopsied. Proximal stomach                           - A single duodenal polyp. Biopsied. Could be a                            prominent fold and not a true polyp                           -  Gastroesophageal flap valve classified as Hill                            Grade II (fold present, opens with respiration). Recommendation:           - Patient has a contact number available for                            emergencies. The signs and symptoms of potential                            delayed complications were discussed with the                            patient. Return to normal activities tomorrow.                            Written discharge instructions were provided to the                            patient.                           - Resume previous diet.                           - Continue present medications.                           - Await pathology results. Gatha Mayer, MD 06/12/2020 10:18:47 AM This report has been signed electronically.

## 2020-06-12 NOTE — Patient Instructions (Addendum)
I saw a small polyp in the upper intestine and some tiny polyps in the stomach along with some inflammation. Biopsies taken and I will call with results.  No ulcers, no cancer seen.  I appreciate the opportunity to care for you. Gatha Mayer, MD, FACG   YOU HAD AN ENDOSCOPIC PROCEDURE TODAY AT [facility]:   Refer to the procedure report that was given to you for any specific questions about what was found during the examination.  If the procedure report does not answer your questions, please call your gastroenterologist to clarify.  If you requested that your care partner not be given the details of your procedure findings, then the procedure report has been included in a sealed envelope for you to review at your convenience later.  YOU SHOULD EXPECT: Some feelings of bloating in the abdomen. Passage of more gas than usual.  Walking can help get rid of the air that was put into your GI tract during the procedure and reduce the bloating. If you had a lower endoscopy (such as a colonoscopy or flexible sigmoidoscopy) you may notice spotting of blood in your stool or on the toilet paper. If you underwent a bowel prep for your procedure, you may not have a normal bowel movement for a few days.  Please Note:  You might notice some irritation and congestion in your nose or some drainage.  This is from the oxygen used during your procedure.  There is no need for concern and it should clear up in a day or so.  SYMPTOMS TO REPORT IMMEDIATELY:   Following upper endoscopy (EGD)  Vomiting of blood or coffee ground material  New chest pain or pain under the shoulder blades  Painful or persistently difficult swallowing  New shortness of breath  Fever of 100F or higher  Black, tarry-looking stools  For urgent or emergent issues, a gastroenterologist can be reached at any hour by calling [phone]. Do not use MyChart messaging for urgent concerns.    DIET:  We do recommend a  small meal at first, but then you may proceed to your regular diet.  Drink plenty of fluids but you should avoid alcoholic beverages for 24 hours.  ACTIVITY:  You should plan to take it easy for the rest of today and you should NOT DRIVE or use heavy machinery until tomorrow (because of the sedation medicines used during the test).    FOLLOW UP: Our staff will call the number listed on your records 48-72 hours following your procedure to check on you and address any questions or concerns that you may have regarding the information given to you following your procedure. If we do not reach you, we will leave a message.  We will attempt to reach you two times.  During this call, we will ask if you have developed any symptoms of COVID 19. If you develop any symptoms (ie: fever, flu-like symptoms, shortness of breath, cough etc.) before then, please call [phone].  If you test positive for Covid 19 in the 2 weeks post procedure, please call and report this information to [place].    If any biopsies were taken you will be contacted by phone or by letter within the next 1-3 weeks.  Please call us at [phone] if you have not heard about the biopsies in 3 weeks.    SIGNATURES/CONFIDENTIALITY: You and/or your care partner have signed paperwork which will be entered into your electronic medical record.  These signatures  attest to the fact that that the information above on your After Visit Summary has been reviewed and is understood.  Full responsibility of the confidentiality of this discharge information lies with you and/or your care-partner.

## 2020-06-12 NOTE — Progress Notes (Signed)
Called to room to assist during endoscopic procedure.  Patient ID and intended procedure confirmed with present staff. Received instructions for my participation in the procedure from the performing physician.  

## 2020-06-12 NOTE — Progress Notes (Signed)
pt tolerated well. VSS. awake and to recovery. Report given to RN. Bite block used without trauma.

## 2020-06-16 ENCOUNTER — Telehealth: Payer: Self-pay | Admitting: *Deleted

## 2020-06-16 ENCOUNTER — Telehealth: Payer: Self-pay

## 2020-06-16 NOTE — Telephone Encounter (Signed)
Second attempt, left VM.  

## 2020-06-16 NOTE — Telephone Encounter (Signed)
Left message on answering machine. 

## 2020-06-26 ENCOUNTER — Telehealth: Payer: Self-pay | Admitting: Family Medicine

## 2020-06-26 NOTE — Telephone Encounter (Signed)
Left message for patient to call back and schedule Medicare Annual Wellness Visit (AWV) either virtually or in office. No detailed message left   AWVI  please schedule at anytime with [facility] Nurse Health Advisor 1 or 2   This should be a 45 minute visit. 

## 2020-06-29 ENCOUNTER — Telehealth: Payer: Self-pay | Admitting: Internal Medicine

## 2020-06-29 NOTE — Telephone Encounter (Signed)
Patient calling for results from EGD 2/11.  Please advise

## 2020-06-29 NOTE — Telephone Encounter (Signed)
I spoke to the patient and explained that the gastric biopsy showed benign innocent fundic gland polyps and gastritis.  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  I told her I wanted to discuss with my colleagues about whether or not endoscopic ultrasound was necessary or useful here.  I will contact her with the results of that discussion.  She does not need a letter or recall from the [facility].

## 2020-07-02 ENCOUNTER — Other Ambulatory Visit: Payer: Self-pay | Admitting: Internal Medicine

## 2020-07-02 MED ORDER — ESOMEPRAZOLE MAGNESIUM 40 MG PO CPDR: 40.0000 mg | DELAYED_RELEASE_CAPSULE | Freq: Every day | ORAL | 3 refills | Status: DC

## 2020-07-02 MED ORDER — GAVISCON 80-14.2 MG PO CHEW: CHEWABLE_TABLET | ORAL | Status: DC

## 2020-07-02 NOTE — Progress Notes (Signed)
I spoke to her and told her we need to set up snare polypectomy of the duodenal polyp for the hospital.  She is nervous about waiting until my next available hospital day but medically that is fine so I reassured her as best I could.  I think that is May 9.  I have also told her to reduce Nexium to 40 mg daily and to use Gaviscon 2 tablets or 2 tablespoons every night at bedtime to try to help her morning "sour stomach"

## 2020-07-03 ENCOUNTER — Other Ambulatory Visit: Payer: Self-pay

## 2020-07-03 DIAGNOSIS — K317 Polyp of stomach and duodenum: Secondary | ICD-10-CM

## 2020-07-08 ENCOUNTER — Other Ambulatory Visit: Payer: Self-pay

## 2020-07-08 ENCOUNTER — Encounter: Payer: Self-pay | Admitting: Family Medicine

## 2020-07-08 ENCOUNTER — Ambulatory Visit (INDEPENDENT_AMBULATORY_CARE_PROVIDER_SITE_OTHER): Payer: Medicare Other | Admitting: Family Medicine

## 2020-07-08 VITALS — BP 128/70 | HR 70 | Temp 99.0°F | Wt 151.8 lb

## 2020-07-08 DIAGNOSIS — S0083XA Contusion of other part of head, initial encounter: Secondary | ICD-10-CM
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
## 2020-07-09 ENCOUNTER — Ambulatory Visit (INDEPENDENT_AMBULATORY_CARE_PROVIDER_SITE_OTHER)
Admission: RE | Admit: 2020-07-09 | Discharge: 2020-07-09 | Disposition: A | Discharge status: Home / Self Care | Payer: Medicare Other | Source: Ambulatory Visit | Attending: Family Medicine | Admitting: Family Medicine

## 2020-07-09 DIAGNOSIS — S0990XA Unspecified injury of head, initial encounter: Secondary | ICD-10-CM | POA: Diagnosis not present

## 2020-07-09 DIAGNOSIS — S0083XA Contusion of other part of head, initial encounter: Secondary | ICD-10-CM

## 2020-07-13 ENCOUNTER — Other Ambulatory Visit: Payer: Medicare Other

## 2020-07-17 NOTE — Progress Notes (Unsigned)
Subjective:   [name] is a 70 y.o. female who presents for an Initial Medicare Annual Wellness Visit.  Review of Systems    N/A        Objective:    There were no vitals filed for this visit. There is no height or weight on file to calculate BMI.  Advanced Directives 12/15/2016 01/25/2015 10/09/2014  Does Patient Have a Medical Advance Directive? Yes No No  Type of Paramedic of [place];Living will - -  Would patient like information on creating a medical advance directive? - No - patient declined information No - patient declined information    Current Medications (verified) Outpatient Encounter Medications as of 07/20/2020  Medication Sig  . Alum Hydroxide-Mag Trisilicate (GAVISCON) 29-47.6 MG CHEW 2 tablets at bedtime and 2 tablets as needed  . amLODipine (NORVASC) 5 MG tablet TAKE 1 TABLET BY MOUTH  DAILY  . b complex vitamins capsule Take 1 capsule by mouth daily.  Marland Kitchen esomeprazole (NEXIUM) 40 MG capsule Take 1 capsule (40 mg total) by mouth daily before breakfast.  . estradiol (ESTRACE) 0.1 MG/GM vaginal cream Place 1 Applicatorful vaginally once a week.  . hydrochlorothiazide (HYDRODIURIL) 25 MG tablet TAKE 1 TABLET(25 MG) BY MOUTH DAILY  . KRILL OIL PO Take 1 tablet by mouth daily.  Marland Kitchen losartan (COZAAR) 100 MG tablet TAKE 1 TABLET(100 MG) BY MOUTH DAILY  . metoprolol succinate (TOPROL-XL) 100 MG 24 hr tablet TAKE 1 TABLET BY MOUTH  TWICE DAILY WITH OR  IMMEDIATELY FOLLOWING MEAL  . Probiotic Product (PROBIOTIC DAILY PO) Take 1 tablet by mouth daily.  . valACYclovir (VALTREX) 1000 MG tablet Take 1,000 mg by mouth 2 (two) times daily as needed. For outbreaks   No facility-administered encounter medications on file as of 07/20/2020.    Allergies (verified) Tramadol, Lansoprazole, Levaquin [levofloxacin in d5w], Macrobid [nitrofurantoin macrocrystal], Shellfish allergy, Sulfamethoxazole-trimethoprim, and Penicillins   History: Past Medical History:   Diagnosis Date  . Allergy   . Anemia   . Arthritis   . Atherosclerosis of other specified arteries   . Bilateral carpal tunnel syndrome   . Cataract   . GERD (gastroesophageal reflux disease)   . Hemorrhoids   . Hiatal hernia   . Hypertension   . IBS (irritable bowel syndrome)   . Low back pain   . Osteopenia   . Renal disorder   . UTI (lower urinary tract infection)   . Venous insufficiency    Past Surgical History:  Procedure Laterality Date  . ABDOMINAL HYSTERECTOMY    . bilateral carpal tunnel release  01/2008 and 06/2005   Dr. Daylene Katayama  . BREAST REDUCTION SURGERY  1999  . COLONOSCOPY  12/29/2016   per Dr. Carlean Purl, hemorrhoids only, no polyps, repeat in 10 yrs   . ENDOVENOUS ABLATION SAPHENOUS VEIN W/ LASER  12-15-2011   right greater saphenous vein by Curt Jews MD  . ENDOVENOUS ABLATION SAPHENOUS VEIN W/ LASER  01-26-2012   left greater saphenous vein by Curt Jews MD  . ESOPHAGOGASTRODUODENOSCOPY  12/29/2016   per Dr. Carlean Purl, gastritis   . hemorrhoid sugery  04/2009   Dr. Dalbert Batman  . left cataract and lens  07/2008   Dr. Ellie Lunch   Family History  Problem Relation Age of Onset  . Stroke Mother   . COPD Father   . Lung cancer Sister        smoked  . Colon cancer Neg Hx   . Esophageal cancer Neg Hx   .  Pancreatic cancer Neg Hx   . Rectal cancer Neg Hx   . Stomach cancer Neg Hx    Social History   Socioeconomic History  . Marital status: Married    Spouse name: Not on file  . Number of children: 2  . Years of education: Not on file  . Highest education level: Not on file  Occupational History  . Occupation: retired  Tobacco Use  . Smoking status: Never Smoker  . Smokeless tobacco: Never Used  Vaping Use  . Vaping Use: Never used  Substance and Sexual Activity  . Alcohol use: No  . Drug use: No  . Sexual activity: Not Currently  Other Topics Concern  . 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 No          Activities of Daily Living No flowsheet data found.  Patient Care Team: [name], MD as PCP - General (Family Medicine)  Indicate any recent Medical Services you may have received from other than [facility] providers in the past year (date may be approximate).     Assessment:   This is a routine wellness examination for [name].  Hearing/Vision screen No exam data present  Dietary issues and exercise activities discussed:    Goals   None    Depression Screen PHQ 2/9 Scores 09/04/2017 10/24/2012  PHQ - 2 Score 0 0    Fall Risk Fall Risk  09/04/2017  Falls in the past year? No    FALL RISK PREVENTION PERTAINING TO THE HOME:  Any stairs in or around the home? {YES/NO:21197} If so, are there any without handrails? No  Home free of loose throw rugs in walkways, pet beds, electrical cords, etc? Yes  Adequate lighting in your home to reduce risk of falls? Yes   ASSISTIVE DEVICES UTILIZED TO PREVENT FALLS:  Life alert? {YES/NO:21197} Use of a cane, walker or w/c? {YES/NO:21197} Grab bars in the bathroom? {YES/NO:21197} Shower chair or bench in shower? {YES/NO:21197} Elevated toilet seat or a handicapped toilet? {YES/NO:21197}  TIMED UP AND GO:  Was the test performed? Yes .  Length of time to ambulate 10 feet: *** sec.   {Appearance of GBTD:1761607}  Cognitive Function:        Immunizations Immunization History  Administered Date(s) Administered  . Fluad Quad(high Dose 65+)  01/22/2019  . Influenza Split 01/21/2013  . Influenza, High Dose Seasonal PF 01/25/2017, 03/06/2018  . Influenza,inj,Quad PF,6+ Mos 02/21/2014, 01/20/2015, 02/04/2016  . Pneumococcal Polysaccharide-23 01/22/2019  . Tdap 01/20/2015    TDAP status: Up to date  {Flu Vaccine status:2101806}  {Pneumococcal vaccine status:2101807}  {Covid-19 vaccine status:2101808}  Qualifies for Shingles Vaccine? Yes   Zostavax completed No   Shingrix Completed?: No.    Education has been provided regarding the importance of this vaccine. Patient has been advised to call insurance company to determine  out of pocket expense if they have not yet received this vaccine. Advised may also receive vaccine at local pharmacy or Health Dept. Verbalized acceptance and understanding.  Screening Tests Health Maintenance  Topic Date Due  . COVID-19 Vaccine (1) Never done  . INFLUENZA VACCINE  12/01/2019  . PNA vac Low Risk Adult (2 of 2 - PCV13) 01/22/2020  . MAMMOGRAM  05/16/2021  . TETANUS/TDAP  01/19/2025  . COLONOSCOPY (Pts 45-72yrs Insurance coverage will need to be confirmed)  12/30/2026  . DEXA SCAN  Completed  . Hepatitis C Screening  Completed  . HPV VACCINES  Aged Out    Health Maintenance  Health Maintenance Due  Topic Date Due  . COVID-19 Vaccine (1) Never done  . INFLUENZA VACCINE  12/01/2019  . PNA vac Low Risk Adult (2 of 2 - PCV13) 01/22/2020    Colorectal cancer screening: Type of screening: Colonoscopy. Completed 12/29/2016. Repeat every 10 years  Mammogram status: Ordered 07/20/2020. Pt provided with contact info and advised to call to schedule appt.   Bone Density status: Completed 08/02/2019. Results reflect: Bone density results: OSTEOPENIA. Repeat every 5 years.  Lung Cancer Screening: (Low Dose CT Chest recommended if Age 69-80 years, 30 pack-year currently smoking OR have quit w/in 15years.) does not qualify.   Lung Cancer Screening Referral: N/a   Additional  Screening:  Hepatitis C Screening: does qualify; Completed 10/05/2016  Vision Screening: Recommended annual ophthalmology exams for early detection of glaucoma and other disorders of the eye. Is the patient up to date with their annual eye exam?  {YES/NO:21197} Who is the provider or what is the name of the office in which the patient attends annual eye exams? *** If pt is not established with a provider, would they like to be referred to a provider to establish care? {YES/NO:21197}.   Dental Screening: Recommended annual dental exams for proper oral hygiene  Community Resource Referral / Chronic Care Management: CRR required this visit?  No   CCM required this visit?  No      Plan:     I have personally reviewed and noted the following in the patient's chart:   . Medical and social history . Use of alcohol, tobacco or illicit drugs  . Current medications and supplements . Functional ability and status . Nutritional status . Physical activity . Advanced directives . List of other physicians . Hospitalizations, surgeries, and ER visits in previous 12 months . Vitals . Screenings to include cognitive, depression, and falls . Referrals and appointments  In addition, I have reviewed and discussed with patient certain preventive protocols, quality metrics, and best practice recommendations. A written personalized care plan for preventive services as well as general preventive health recommendations were provided to patient.     Ofilia Neas, LPN   12/25/4156   Nurse Notes: None

## 2020-07-20 ENCOUNTER — Ambulatory Visit: Payer: Medicare Other

## 2020-07-20 DIAGNOSIS — Z0000 Encounter for general adult medical examination without abnormal findings: Secondary | ICD-10-CM

## 2020-08-06 ENCOUNTER — Other Ambulatory Visit (HOSPITAL_COMMUNITY)
Admission: RE | Admit: 2020-08-06 | Discharge: 2020-08-06 | Disposition: A | Discharge status: Home / Self Care | Payer: Medicare Other | Source: Ambulatory Visit | Attending: Internal Medicine | Admitting: Internal Medicine

## 2020-08-06 ENCOUNTER — Other Ambulatory Visit: Payer: Self-pay

## 2020-08-06 ENCOUNTER — Encounter (HOSPITAL_COMMUNITY): Payer: Self-pay | Admitting: Internal Medicine

## 2020-08-06 DIAGNOSIS — Z20822 Contact with and (suspected) exposure to covid-19: Secondary | ICD-10-CM | POA: Insufficient documentation

## 2020-08-06 DIAGNOSIS — Z01812 Encounter for preprocedural laboratory examination: Secondary | ICD-10-CM | POA: Insufficient documentation

## 2020-08-06 DIAGNOSIS — K317 Polyp of stomach and duodenum: Secondary | ICD-10-CM

## 2020-08-06 LAB — SARS CORONAVIRUS 2 (TAT 6-24 HRS): SARS Coronavirus 2: NEGATIVE

## 2020-08-07 ENCOUNTER — Ambulatory Visit: Payer: Medicare Other

## 2020-08-10 ENCOUNTER — Ambulatory Visit (HOSPITAL_COMMUNITY): Payer: Medicare Other | Admitting: Anesthesiology

## 2020-08-10 ENCOUNTER — Ambulatory Visit (HOSPITAL_COMMUNITY)
Admission: RE | Admit: 2020-08-10 | Discharge: 2020-08-10 | Disposition: A | Discharge status: Home / Self Care | Payer: Medicare Other | Attending: Internal Medicine | Admitting: Internal Medicine

## 2020-08-10 ENCOUNTER — Other Ambulatory Visit: Payer: Self-pay

## 2020-08-10 ENCOUNTER — Encounter (HOSPITAL_COMMUNITY): Payer: Self-pay | Admitting: Internal Medicine

## 2020-08-10 ENCOUNTER — Encounter (HOSPITAL_COMMUNITY)
Admission: RE | Disposition: A | Discharge status: Home / Self Care | Payer: Self-pay | Source: Home / Self Care | Attending: Internal Medicine

## 2020-08-10 DIAGNOSIS — Z881 Allergy status to other antibiotic agents status: Secondary | ICD-10-CM | POA: Diagnosis not present

## 2020-08-10 DIAGNOSIS — Z885 Allergy status to narcotic agent status: Secondary | ICD-10-CM | POA: Diagnosis not present

## 2020-08-10 DIAGNOSIS — K317 Polyp of stomach and duodenum: Secondary | ICD-10-CM | POA: Diagnosis not present

## 2020-08-10 DIAGNOSIS — Z79899 Other long term (current) drug therapy: Secondary | ICD-10-CM | POA: Diagnosis not present

## 2020-08-10 DIAGNOSIS — Z880 Allergy status to penicillin: Secondary | ICD-10-CM | POA: Diagnosis not present

## 2020-08-10 DIAGNOSIS — Z888 Allergy status to other drugs, medicaments and biological substances status: Secondary | ICD-10-CM | POA: Insufficient documentation

## 2020-08-10 DIAGNOSIS — M859 Disorder of bone density and structure, unspecified: Secondary | ICD-10-CM | POA: Diagnosis not present

## 2020-08-10 DIAGNOSIS — D132 Benign neoplasm of duodenum: Secondary | ICD-10-CM

## 2020-08-10 DIAGNOSIS — Z882 Allergy status to sulfonamides status: Secondary | ICD-10-CM | POA: Diagnosis not present

## 2020-08-10 DIAGNOSIS — Z8719 Personal history of other diseases of the digestive system: Secondary | ICD-10-CM | POA: Insufficient documentation

## 2020-08-10 DIAGNOSIS — K219 Gastro-esophageal reflux disease without esophagitis: Secondary | ICD-10-CM | POA: Diagnosis not present

## 2020-08-10 DIAGNOSIS — Z79890 Hormone replacement therapy (postmenopausal): Secondary | ICD-10-CM | POA: Insufficient documentation

## 2020-08-10 DIAGNOSIS — I10 Essential (primary) hypertension: Secondary | ICD-10-CM | POA: Diagnosis not present

## 2020-08-10 HISTORY — PX: ESOPHAGOGASTRODUODENOSCOPY (EGD) WITH PROPOFOL: SHX5813

## 2020-08-10 HISTORY — PX: SUBMUCOSAL TATTOO INJECTION: SHX6856

## 2020-08-10 HISTORY — PX: POLYPECTOMY: SHX5525

## 2020-08-10 SURGERY — ESOPHAGOGASTRODUODENOSCOPY (EGD) WITH PROPOFOL
Anesthesia: Monitor Anesthesia Care

## 2020-08-10 MED ORDER — PROPOFOL 500 MG/50ML IV EMUL
INTRAVENOUS | Status: DC | PRN
  Administered 2020-08-10: 125 ug/kg/min via INTRAVENOUS

## 2020-08-10 MED ORDER — SPOT INK MARKER SYRINGE KIT
PACK | SUBMUCOSAL | Status: AC
  Filled 2020-08-10: qty 5

## 2020-08-10 MED ORDER — PROPOFOL 10 MG/ML IV BOLUS
INTRAVENOUS | Status: DC | PRN
  Administered 2020-08-10 (×2): 10 mg via INTRAVENOUS

## 2020-08-10 MED ORDER — PROPOFOL 500 MG/50ML IV EMUL
INTRAVENOUS | Status: AC
  Filled 2020-08-10: qty 50

## 2020-08-10 MED ORDER — PHENYLEPHRINE HCL (PRESSORS) 10 MG/ML IV SOLN
INTRAVENOUS | Status: DC | PRN
  Administered 2020-08-10: 40 ug via INTRAVENOUS

## 2020-08-10 MED ORDER — LIDOCAINE HCL 1 % IJ SOLN
INTRAMUSCULAR | Status: DC | PRN
  Administered 2020-08-10: 50 mg via INTRADERMAL
  Administered 2020-08-10: 30 mg via INTRADERMAL

## 2020-08-10 MED ORDER — LACTATED RINGERS IV SOLN
INTRAVENOUS | Status: DC
  Administered 2020-08-10: 1000 mL via INTRAVENOUS

## 2020-08-10 MED ORDER — SPOT INK MARKER SYRINGE KIT
PACK | SUBMUCOSAL | Status: DC | PRN
Start: 2020-08-10 — End: 2020-08-10
  Administered 2020-08-10: 1 mL via SUBMUCOSAL

## 2020-08-10 MED ORDER — EPHEDRINE SULFATE 50 MG/ML IJ SOLN
INTRAMUSCULAR | Status: DC | PRN
  Administered 2020-08-10: 5 mg via INTRAVENOUS

## 2020-08-10 SURGICAL SUPPLY — 15 items

## 2020-08-10 NOTE — Discharge Instructions (Signed)
I removed the polyp.  I will let you know results soon. Nothing looks bad.   I marked the spot with a tattoo also.  I appreciate the opportunity to care for you. Brittany Mayer, MD, FACG   YOU HAD AN ENDOSCOPIC PROCEDURE TODAY: Refer to the procedure report and other information in the discharge instructions given to you for any specific questions about what was found during the examination. If this information does not answer your questions, please call Dr. Celesta Aver office at [phone] to clarify.   YOU SHOULD EXPECT: Some feelings of bloating in the abdomen. Passage of more gas than usual. Walking can help get rid of the air that was put into your GI tract during the procedure and reduce the bloating. If you had a lower endoscopy (such as a colonoscopy or flexible sigmoidoscopy) you may notice spotting of blood in your stool or on the toilet paper. Some abdominal soreness may be present for a day or two, also.  DIET: Your first meal following the procedure should be a light meal and then it is ok to progress to your normal diet. A half-sandwich or bowl of soup is an example of a good first meal. Heavy or fried foods are harder to digest and may make you feel nauseous or bloated. Drink plenty of fluids but you should avoid alcoholic beverages for 24 hours.   ACTIVITY: Your care partner should take you home directly after the procedure. You should plan to take it easy, moving slowly for the rest of the day. You can resume normal activity the day after the procedure however YOU SHOULD NOT DRIVE, use power tools, machinery or perform tasks that involve climbing or major physical exertion for 24 hours (because of the sedation medicines used during the test).   SYMPTOMS TO REPORT IMMEDIATELY: A gastroenterologist can be reached at any hour. Please call [phone]  for any of the following symptoms:   Following upper endoscopy (EGD, EUS, ERCP, esophageal dilation) Vomiting of blood or coffee  ground material  New, significant abdominal pain  New, significant chest pain or pain under the shoulder blades  Painful or persistently difficult swallowing  New shortness of breath  Black, tarry-looking or red, bloody stools

## 2020-08-10 NOTE — Transfer of Care (Signed)
Immediate Anesthesia Transfer of Care Note  Patient: [name]  Procedure(s) Performed: ESOPHAGOGASTRODUODENOSCOPY (EGD) WITH PROPOFOL (N/A ) POLYPECTOMY  Patient Location: PACU and Endoscopy Unit  Anesthesia Type:MAC  Level of Consciousness: awake, alert , oriented and patient cooperative  Airway & Oxygen Therapy: Patient Spontanous Breathing and Patient connected to face mask oxygen  Post-op Assessment: Report given to RN and Post -op Vital signs reviewed and stable  Post vital signs: Reviewed and stable  Last Vitals:  Vitals Value Taken Time  BP    Temp    Pulse 59 08/10/20 1103  Resp 21 08/10/20 1103  SpO2 100 % 08/10/20 1103  Vitals shown include unvalidated device data.  Last Pain:  Vitals:   08/10/20 0935  TempSrc: Oral  PainSc: 0-No pain         Complications: No complications documented.

## 2020-08-10 NOTE — Anesthesia Postprocedure Evaluation (Signed)
Anesthesia Post Note  Patient: [name]  Procedure(s) Performed: ESOPHAGOGASTRODUODENOSCOPY (EGD) WITH PROPOFOL (N/A ) POLYPECTOMY     Patient location during evaluation: PACU Anesthesia Type: MAC Level of consciousness: awake Pain management: pain level controlled Vital Signs Assessment: post-procedure vital signs reviewed and stable Respiratory status: spontaneous breathing Cardiovascular status: stable Postop Assessment: no apparent nausea or vomiting Anesthetic complications: no   No complications documented.  Last Vitals:  Vitals:   08/10/20 1119 08/10/20 1134  BP: (!) 121/41 125/60  Pulse: (!) 54 (!) 57  Resp: 14 17  Temp:    SpO2: 99% 100%    Last Pain:  Vitals:   08/10/20 1134  TempSrc:   PainSc: (P) 0-No pain                 John F Maricarmen Braziel Brooke Bonito

## 2020-08-10 NOTE — Op Note (Signed)
[facility] Patient Name: [name] Procedure Date: 08/10/2020 MRN: [id number] Attending MD: Gatha Mayer , MD Date of Birth: [birth year] CSN: [id number] Age: 70 Admit Type: Outpatient Procedure:                Upper GI endoscopy Indications:              Therapeutic procedure, Polyps in the duodenum, For                            therapy of polyps in the duodenum Providers:                Gatha Mayer, MD, Erenest Rasher, RN, Cherylynn Ridges, Technician, Karis Juba, CRNA Referring MD:              Medicines:                Propofol per Anesthesia, Monitored Anesthesia Care Complications:            No immediate complications. Estimated Blood Loss:     Estimated blood loss was minimal. Procedure:                Pre-Anesthesia Assessment:                           - Prior to the procedure, a History and Physical                            was performed, and patient medications and                            allergies were reviewed. The patient's tolerance of                            previous anesthesia was also reviewed. The risks                            and benefits of the procedure and the sedation                            options and risks were discussed with the patient.                            All questions were answered, and informed consent                            was obtained. Prior Anticoagulants: The patient has                            taken no previous anticoagulant or antiplatelet                            agents. ASA Grade Assessment: II - A patient with  mild systemic disease. After reviewing the risks                            and benefits, the patient was deemed in                            satisfactory condition to undergo the procedure.                           After obtaining informed consent, the endoscope was                            passed under direct vision.  Throughout the                            procedure, the patient's blood pressure, pulse, and                            oxygen saturations were monitored continuously. The                            GIF-H190 ([id number]) Olympus gastroscope was                            introduced through the mouth, and advanced to the                            second part of duodenum. The upper GI endoscopy was                            accomplished without difficulty. The patient                            tolerated the procedure well. Scope In: Scope Out: Findings:      A single 6 mm sessile polyp was found in the second portion of the       duodenum. The polyp was removed with a cold snare. Resection and       retrieval were complete. Verification of patient identification for the       specimen was done. Estimated blood loss was minimal. Area was tattooed       with an injection of 1 mL of Spot (carbon black). Estimated blood loss:       none.      The exam was otherwise without abnormality.      The cardia and gastric fundus were normal on retroflexion. Impression:               - A single duodenal polyp. Resected and retrieved.                            Tattooed.                           - The examination was otherwise normal. Moderate Sedation:      Not Applicable - Patient had care per Anesthesia. Recommendation:           -  Patient has a contact number available for                            emergencies. The signs and symptoms of potential                            delayed complications were discussed with the                            patient. Return to normal activities tomorrow.                            Written discharge instructions were provided to the                            patient.                           - Resume previous diet.                           - Continue present medications.                           - No aspirin, ibuprofen, naproxen, or other                             non-steroidal anti-inflammatory drugs for 2 weeks                            after polyp removal. Procedure Code(s):        --- Professional ---                           [phone], Esophagogastroduodenoscopy, flexible,                            transoral; with removal of tumor(s), polyp(s), or                            other lesion(s) by snare technique                           43236, Esophagogastroduodenoscopy, flexible,                            transoral; with directed submucosal injection(s),                            any substance Diagnosis Code(s):        --- Professional ---                           K31.7, Polyp of stomach and duodenum CPT copyright 2019 American Medical Association. All rights reserved. The codes documented in this report are preliminary and upon coder review may  be revised to meet current compliance requirements. Gatha Mayer,  MD 08/10/2020 11:13:18 AM This report has been signed electronically. Number of Addenda: 0

## 2020-08-10 NOTE — Anesthesia Preprocedure Evaluation (Signed)
Anesthesia Evaluation  Patient identified by MRN, date of birth, ID band Patient awake    Reviewed: Allergy & Precautions, NPO status , Patient's Chart, lab work & pertinent test results, reviewed documented beta blocker date and time   Airway Mallampati: I       Dental no notable dental hx.    Pulmonary neg pulmonary ROS,    Pulmonary exam normal        Cardiovascular hypertension, Pt. on home beta blockers Normal cardiovascular exam     Neuro/Psych negative psych ROS   GI/Hepatic GERD  Medicated and Controlled,  Endo/Other    Renal/GU      Musculoskeletal   Abdominal Normal abdominal exam  (+)   Peds  Hematology   Anesthesia Other Findings   Reproductive/Obstetrics                             Anesthesia Physical Anesthesia Plan  ASA: II  Anesthesia Plan: MAC   Post-op Pain Management:    Induction:   PONV Risk Score and Plan: 2 and Propofol infusion  Airway Management Planned: Natural Airway and Mask  Additional Equipment: None  Intra-op Plan:   Post-operative Plan:   Informed Consent: I have reviewed the patients History and Physical, chart, labs and discussed the procedure including the risks, benefits and alternatives for the proposed anesthesia with the patient or authorized representative who has indicated his/her understanding and acceptance.       Plan Discussed with: CRNA  Anesthesia Plan Comments:         Anesthesia Quick Evaluation

## 2020-08-10 NOTE — H&P (Signed)
[facility] History and Physical   Primary Care Physician:  [name], MD   Reason for Procedure:   remove polyp  Plan:    Duodenal polypectomy via EGD     HPI: [name] is a 70 y.o. 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  Here for definitive removal.  Past Medical History:  Diagnosis Date  . Allergy   . Anemia   . Arthritis   . Atherosclerosis of other specified arteries   . Bilateral carpal tunnel syndrome   . Cataract   . GERD (gastroesophageal reflux disease)   . Hemorrhoids   . Hiatal hernia   . Hypertension   . IBS (irritable bowel syndrome)   . Low back pain   . Osteopenia   . Renal disorder   . UTI (lower urinary tract infection)   . Venous insufficiency     Past Surgical History:  Procedure Laterality Date  . ABDOMINAL HYSTERECTOMY    . bilateral carpal tunnel release  01/2008 and 06/2005   Dr. Daylene Katayama  . BREAST REDUCTION SURGERY  1999  . COLONOSCOPY  12/29/2016   per Dr. Carlean Purl, hemorrhoids only, no polyps, repeat in 10 yrs   . ENDOVENOUS ABLATION SAPHENOUS VEIN W/ LASER  12-15-2011   right greater saphenous vein by Curt Jews MD  . ENDOVENOUS ABLATION SAPHENOUS VEIN W/ LASER  01-26-2012   left greater saphenous vein by Curt Jews MD  . ESOPHAGOGASTRODUODENOSCOPY  12/29/2016   per Dr. Carlean Purl, gastritis   . hemorrhoid sugery  04/2009   Dr. Dalbert Batman  . left cataract and lens  07/2008   Dr. Ellie Lunch    Prior to Admission medications   Medication Sig Start Date End Date Taking? Authorizing Provider  amLODipine (NORVASC) 5 MG tablet TAKE 1 TABLET BY MOUTH  DAILY Patient taking differently: Take 5 mg by mouth daily. 04/06/20  Yes [name], MD  b complex vitamins capsule Take 1 capsule by mouth daily.   Yes [provider]  Cholecalciferol (VITAMIN D) 50 MCG (2000 UT) CAPS Take 2,000 Units by mouth daily.   Yes [provider]  esomeprazole (NEXIUM) 40 MG  capsule Take 1 capsule (40 mg total) by mouth daily before breakfast. 07/02/20  Yes Gatha Mayer, MD  estradiol (ESTRACE) 0.1 MG/GM vaginal cream Place 1 Applicatorful vaginally once a week. 10/01/19  Yes [provider]  hydrochlorothiazide (HYDRODIURIL) 25 MG tablet TAKE 1 TABLET(25 MG) BY MOUTH DAILY Patient taking differently: Take 25 mg by mouth daily. 10/29/19  Yes [name], MD  KRILL OIL PO Take 1 tablet by mouth daily.   Yes [provider]  losartan (COZAAR) 100 MG tablet TAKE 1 TABLET(100 MG) BY MOUTH DAILY Patient taking differently: Take 100 mg by mouth daily. 10/29/19  Yes [name], MD  metoprolol succinate (TOPROL-XL) 100 MG 24 hr tablet TAKE 1 TABLET BY MOUTH  TWICE DAILY WITH OR  IMMEDIATELY FOLLOWING MEAL Patient taking differently: Take 100 mg by mouth 2 (two) times daily with a meal. 04/06/20  Yes [name], MD  Probiotic Product (PROBIOTIC DAILY PO) Take 1 tablet by mouth daily.   Yes [provider]  valACYclovir (VALTREX) 500 MG tablet Take 500 mg by mouth 2 (two) times daily as needed (fever blisters). 06/03/14  Yes [provider]  Alum Hydroxide-Mag Trisilicate (GAVISCON) 82-99.3 MG CHEW 2 tablets at bedtime and 2 tablets as needed  Patient not taking: No sig reported 07/02/20   Gatha Mayer, MD    Current Facility-Administered Medications  Medication Dose Route Frequency Provider Last Rate Last Admin  . lactated ringers infusion   Intravenous Continuous Gatha Mayer, MD 10 mL/hr at 08/10/20 1026 Continued from Pre-op at 08/10/20 1026    Allergies as of 07/03/2020 - Review Complete 06/12/2020  Allergen Reaction Noted  . Tramadol Other (See Comments) 01/20/2015  . Lansoprazole  06/03/2009  . Levaquin [levofloxacin in d5w] Nausea And Vomiting 10/03/2017  . Macrobid [nitrofurantoin macrocrystal] Other (See Comments) 03/19/2019  . Shellfish allergy  11/26/2015  . Sulfamethoxazole-trimethoprim    . Penicillins Rash      Family History  Problem Relation Age of Onset  . Stroke Mother   . COPD Father   . Lung cancer Sister        smoked  . Colon cancer Neg Hx   . Esophageal cancer Neg Hx   . Pancreatic cancer Neg Hx   . Rectal cancer Neg Hx   . 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  Physical Exam: Vital signs in last 24 hours: Temp:  [98.1 F (36.7 C)] 98.1 F (36.7 C) (04/11 0935) Pulse Rate:  [53] 53 (04/11 0935) Resp:  [20] 20 (04/11 0935) BP: (143)/(61) 143/61 (04/11 0935) SpO2:  [100 %] 100 % (04/11 0935)   General:   Alert,  Well-developed, well-nourished, pleasant and cooperative in NAD Lungs:  Clear throughout to auscultation.   Heart:  Regular rate and rhythm; no murmurs, clicks, rubs,  or gallops. Abdomen:  Soft, nontender and nondistended. Normal bowel sounds.   Neuro/Psych:  Alert and cooperative. Normal mood and affect. A and O x 3   @Elam Ellis  Simonne Maffucci, MD, [facility] [phone] (pager) 08/10/2020 10:33 AM@

## 2020-08-11 ENCOUNTER — Encounter (HOSPITAL_COMMUNITY): Payer: Self-pay | Admitting: Internal Medicine

## 2020-08-11 LAB — SURGICAL PATHOLOGY

## 2020-08-18 ENCOUNTER — Encounter: Payer: Self-pay | Admitting: Internal Medicine

## 2020-09-17 DIAGNOSIS — I129 Hypertensive chronic kidney disease with stage 1 through stage 4 chronic kidney disease, or unspecified chronic kidney disease: Secondary | ICD-10-CM | POA: Diagnosis not present

## 2020-09-17 DIAGNOSIS — N281 Cyst of kidney, acquired: Secondary | ICD-10-CM | POA: Diagnosis not present

## 2020-09-17 DIAGNOSIS — K219 Gastro-esophageal reflux disease without esophagitis: Secondary | ICD-10-CM | POA: Diagnosis not present

## 2020-09-17 DIAGNOSIS — N2581 Secondary hyperparathyroidism of renal origin: Secondary | ICD-10-CM | POA: Diagnosis not present

## 2020-09-17 DIAGNOSIS — N1831 Chronic kidney disease, stage 3a: Secondary | ICD-10-CM | POA: Diagnosis not present

## 2020-09-23 DIAGNOSIS — N1831 Chronic kidney disease, stage 3a: Secondary | ICD-10-CM | POA: Diagnosis not present

## 2020-09-23 DIAGNOSIS — R799 Abnormal finding of blood chemistry, unspecified: Secondary | ICD-10-CM | POA: Diagnosis not present

## 2020-09-24 ENCOUNTER — Telehealth: Payer: Self-pay | Admitting: Family Medicine

## 2020-09-24 NOTE — Telephone Encounter (Signed)
Left message for patient to call back and schedule Medicare Annual Wellness Visit (AWV) either virtually or in office.   AWV-I per [place] 10/30/17  please schedule at anytime with [facility] Nurse Health Advisor 1 or 2   This should be a 45 minute visit.

## 2020-11-06 ENCOUNTER — Other Ambulatory Visit: Payer: Self-pay | Admitting: Family Medicine

## 2020-11-07 ENCOUNTER — Other Ambulatory Visit: Payer: Self-pay | Admitting: Family Medicine

## 2020-11-25 ENCOUNTER — Other Ambulatory Visit: Payer: Self-pay

## 2020-11-26 ENCOUNTER — Encounter: Payer: Self-pay | Admitting: Family Medicine

## 2020-11-26 ENCOUNTER — Ambulatory Visit (INDEPENDENT_AMBULATORY_CARE_PROVIDER_SITE_OTHER): Payer: Medicare Other | Admitting: Family Medicine

## 2020-11-26 VITALS — BP 118/72 | HR 66 | Temp 98.2°F | Ht 62.0 in | Wt 148.4 lb

## 2020-11-26 DIAGNOSIS — E785 Hyperlipidemia, unspecified: Secondary | ICD-10-CM | POA: Insufficient documentation

## 2020-11-26 DIAGNOSIS — I10 Essential (primary) hypertension: Secondary | ICD-10-CM

## 2020-11-26 DIAGNOSIS — M159 Polyosteoarthritis, unspecified: Secondary | ICD-10-CM

## 2020-11-26 DIAGNOSIS — E041 Nontoxic single thyroid nodule: Secondary | ICD-10-CM

## 2020-11-26 DIAGNOSIS — M8949 Other hypertrophic osteoarthropathy, multiple sites: Secondary | ICD-10-CM | POA: Diagnosis not present

## 2020-11-26 DIAGNOSIS — R739 Hyperglycemia, unspecified: Secondary | ICD-10-CM

## 2020-11-26 DIAGNOSIS — K219 Gastro-esophageal reflux disease without esophagitis: Secondary | ICD-10-CM | POA: Diagnosis not present

## 2020-11-26 DIAGNOSIS — M1990 Unspecified osteoarthritis, unspecified site: Secondary | ICD-10-CM | POA: Insufficient documentation

## 2020-11-26 LAB — HEPATIC FUNCTION PANEL
ALT: 18 U/L (ref 0–35)
AST: 23 U/L (ref 0–37)
Albumin: 4.5 g/dL (ref 3.5–5.2)
Alkaline Phosphatase: 54 U/L (ref 39–117)
Bilirubin, Direct: 0.2 mg/dL (ref 0.0–0.3)
Total Bilirubin: 1.1 mg/dL (ref 0.2–1.2)
Total Protein: 7.4 g/dL (ref 6.0–8.3)

## 2020-11-26 LAB — LDL CHOLESTEROL, DIRECT: Direct LDL: 106 mg/dL

## 2020-11-26 LAB — BASIC METABOLIC PANEL
BUN: 20 mg/dL (ref 6–23)
CO2: 30 mEq/L (ref 19–32)
Calcium: 9.8 mg/dL (ref 8.4–10.5)
Chloride: 101 mEq/L (ref 96–112)
Creatinine, Ser: 1.05 mg/dL (ref 0.40–1.20)
GFR: 54.39 mL/min — ABNORMAL LOW (ref >60.00)
Glucose, Bld: 100 mg/dL — ABNORMAL HIGH (ref 70–99)
Potassium: 4.1 mEq/L (ref 3.5–5.1)
Sodium: 138 mEq/L (ref 135–145)

## 2020-11-26 LAB — CBC WITH DIFFERENTIAL/PLATELET
Basophils Absolute: 0 10*3/uL (ref 0.0–0.1)
Basophils Relative: 0.8 % (ref 0.0–3.0)
Eosinophils Absolute: 0.1 10*3/uL (ref 0.0–0.7)
Eosinophils Relative: 3.1 % (ref 0.0–5.0)
HCT: 39.4 % (ref 36.0–46.0)
Hemoglobin: 13.5 g/dL (ref 12.0–15.0)
Lymphocytes Relative: 18 % (ref 12.0–46.0)
Lymphs Abs: 0.8 10*3/uL (ref 0.7–4.0)
MCHC: 34.3 g/dL (ref 30.0–36.0)
MCV: 91.6 fl (ref 78.0–100.0)
Monocytes Absolute: 0.4 10*3/uL (ref 0.1–1.0)
Monocytes Relative: 8.6 % (ref 3.0–12.0)
Neutro Abs: 2.9 10*3/uL (ref 1.4–7.7)
Neutrophils Relative %: 69.5 % (ref 43.0–77.0)
Platelets: 209 10*3/uL (ref 150.0–400.0)
RBC: 4.3 Mil/uL (ref 3.87–5.11)
RDW: 12.4 % (ref 11.5–15.5)
WBC: 4.2 10*3/uL (ref 4.0–10.5)

## 2020-11-26 LAB — LIPID PANEL
Cholesterol: 188 mg/dL (ref 0–200)
HDL: 42 mg/dL (ref >39.00)
NonHDL: 146.29
Total CHOL/HDL Ratio: 4
Triglycerides: 208 mg/dL — ABNORMAL HIGH (ref 0.0–149.0)
VLDL: 41.6 mg/dL — ABNORMAL HIGH (ref 0.0–40.0)

## 2020-11-26 LAB — TSH: TSH: 1.14 u[IU]/mL (ref 0.35–5.50)

## 2020-11-26 LAB — T4, FREE: Free T4: 0.84 ng/dL (ref 0.60–1.60)

## 2020-11-26 LAB — T3, FREE: T3, Free: 3.5 pg/mL (ref 2.3–4.2)

## 2020-11-26 LAB — HEMOGLOBIN A1C: Hgb A1c MFr Bld: 6 % (ref 4.6–6.5)

## 2020-11-26 MED ORDER — TEMAZEPAM 30 MG PO CAPS: 30.0000 mg | ORAL_CAPSULE | Freq: Every evening | ORAL | 0 refills | Status: DC | PRN

## 2020-11-26 NOTE — Progress Notes (Signed)
Subjective:    Patient ID: [name], female    DOB: 03/06/1951, 70 y.o.   MRN: [id number]  HPI Here to follow up on issues. She feels well in general and she remains active. She still enjoys golf. She does mention stiffness and pain in her neck and also the hands. She knows she has arthritis and she takes Tylenol at times. She avoids taking NSAIDs because of her GERD, so she asks if there is anything else she can do to help. Her BP is stable. She had a benign duodenal polyp removed in February, and Dr. Carlean Purl wants to do a one year repeat EGD.  Her mammograms remain normal.   Review of Systems  Constitutional: Negative.   HENT: Negative.    Eyes: Negative.   Respiratory: Negative.    Cardiovascular: Negative.   Gastrointestinal: Negative.   Genitourinary:  Negative for decreased urine volume, difficulty urinating, dyspareunia, dysuria, enuresis, flank pain, frequency, hematuria, pelvic pain and urgency.  Musculoskeletal:  Positive for arthralgias.  Skin: Negative.   Neurological: Negative.  Negative for headaches.  Psychiatric/Behavioral: Negative.        Objective:   Physical Exam Constitutional:      General: She is not in acute distress.    Appearance: Normal appearance. She is well-developed.  HENT:     Head: Normocephalic and atraumatic.     Right Ear: External ear normal.     Left Ear: External ear normal.     Nose: Nose normal.     Mouth/Throat:     Pharynx: No oropharyngeal exudate.  Eyes:     General: No scleral icterus.    Conjunctiva/sclera: Conjunctivae normal.     Pupils: Pupils are equal, round, and reactive to light.  Neck:     Thyroid: No thyromegaly.     Vascular: No JVD.  Cardiovascular:     Rate and Rhythm: Normal rate and regular rhythm.     Heart sounds: Normal heart sounds. No murmur heard.   No friction rub. No gallop.  Pulmonary:     Effort: Pulmonary effort is normal. No respiratory distress.     Breath sounds: Normal breath sounds. No  wheezing or rales.  Chest:     Chest wall: No tenderness.  Abdominal:     General: Bowel sounds are normal. There is no distension.     Palpations: Abdomen is soft. There is no mass.     Tenderness: There is no abdominal tenderness. There is no guarding or rebound.  Musculoskeletal:        General: No tenderness. Normal range of motion.     Cervical back: Normal range of motion and neck supple.  Lymphadenopathy:     Cervical: No cervical adenopathy.  Skin:    General: Skin is warm and dry.     Findings: No erythema or rash.  Neurological:     Mental Status: She is alert and oriented to person, place, and time.     Cranial Nerves: No cranial nerve deficit.     Motor: No abnormal muscle tone.     Coordination: Coordination normal.     Deep Tendon Reflexes: Reflexes are normal and symmetric. Reflexes normal.  Psychiatric:        Behavior: Behavior normal.        Thought Content: Thought content normal.        Judgment: Judgment normal.          Assessment & Plan:  Her HTN and GERD are stable.  For the OA I suggested she try taking glucosamine and chondroitin sulfate. She can also try turmeric. We will get fasting labs to check A1c, lipids, etc. We spent 35 minutes reviewing records and discussing these issues.  Alysia Penna, MD

## 2020-11-26 NOTE — Addendum Note (Signed)
Addended by: Elmer Picker on: 11/26/2020 08:50 AM   Modules accepted: Orders

## 2020-12-01 ENCOUNTER — Telehealth: Payer: Self-pay

## 2020-12-01 NOTE — Telephone Encounter (Signed)
Pt would like to discuss lab results while she is here for nurse visit

## 2020-12-02 ENCOUNTER — Other Ambulatory Visit: Payer: Self-pay

## 2020-12-02 NOTE — Patient Instructions (Signed)
Health Maintenance Due  Topic Date Due   PNA vac Low Risk Adult (2 of 2 - PCV13) 01/22/2020   INFLUENZA VACCINE  11/30/2020    Depression screen PHQ 2/9 09/04/2017 10/24/2012  Decreased Interest 0 0  Down, Depressed, Hopeless 0 0  PHQ - 2 Score 0 0

## 2020-12-02 NOTE — Telephone Encounter (Signed)
Results reviewed by Dr Sarajane Jews

## 2020-12-10 NOTE — Progress Notes (Signed)
Pt needed to reschedule due to having previous vaccine.

## 2020-12-14 ENCOUNTER — Ambulatory Visit (INDEPENDENT_AMBULATORY_CARE_PROVIDER_SITE_OTHER): Payer: Medicare Other

## 2020-12-14 ENCOUNTER — Other Ambulatory Visit: Payer: Self-pay

## 2020-12-14 DIAGNOSIS — Z23 Encounter for immunization: Secondary | ICD-10-CM

## 2020-12-14 NOTE — Progress Notes (Signed)
Per orders of Dr. Sarajane Jews , injection of Prevnar 13 given by Franco Collet. Patient tolerated injection well.

## 2020-12-15 DIAGNOSIS — N390 Urinary tract infection, site not specified: Secondary | ICD-10-CM | POA: Diagnosis not present

## 2020-12-15 DIAGNOSIS — M8580 Other specified disorders of bone density and structure, unspecified site: Secondary | ICD-10-CM | POA: Diagnosis not present

## 2021-01-18 ENCOUNTER — Telehealth: Payer: Self-pay | Admitting: *Deleted

## 2021-01-18 NOTE — Telephone Encounter (Signed)
Not interested in scheduling her medicare wellness visit at this time

## 2021-01-20 ENCOUNTER — Other Ambulatory Visit: Payer: Self-pay

## 2021-01-20 ENCOUNTER — Ambulatory Visit (INDEPENDENT_AMBULATORY_CARE_PROVIDER_SITE_OTHER): Payer: Medicare Other

## 2021-01-20 DIAGNOSIS — H5212 Myopia, left eye: Secondary | ICD-10-CM | POA: Diagnosis not present

## 2021-01-20 DIAGNOSIS — Z23 Encounter for immunization: Secondary | ICD-10-CM | POA: Diagnosis not present

## 2021-01-20 DIAGNOSIS — Z961 Presence of intraocular lens: Secondary | ICD-10-CM | POA: Diagnosis not present

## 2021-02-04 ENCOUNTER — Other Ambulatory Visit: Payer: Self-pay | Admitting: Internal Medicine

## 2021-02-04 ENCOUNTER — Other Ambulatory Visit: Payer: Self-pay | Admitting: Family Medicine

## 2021-02-06 ENCOUNTER — Other Ambulatory Visit: Payer: Self-pay | Admitting: Family Medicine

## 2021-02-08 NOTE — Telephone Encounter (Signed)
Last office visit- 11/26/20 Last refill- 10 caps, no refills  No future office visit scheduled.  Can this patient receive a refill?

## 2021-03-18 ENCOUNTER — Other Ambulatory Visit: Payer: Self-pay | Admitting: Family Medicine

## 2021-04-01 DIAGNOSIS — N281 Cyst of kidney, acquired: Secondary | ICD-10-CM | POA: Diagnosis not present

## 2021-04-01 DIAGNOSIS — K219 Gastro-esophageal reflux disease without esophagitis: Secondary | ICD-10-CM | POA: Diagnosis not present

## 2021-04-01 DIAGNOSIS — N2581 Secondary hyperparathyroidism of renal origin: Secondary | ICD-10-CM | POA: Diagnosis not present

## 2021-04-01 DIAGNOSIS — N1831 Chronic kidney disease, stage 3a: Secondary | ICD-10-CM | POA: Diagnosis not present

## 2021-04-01 DIAGNOSIS — I129 Hypertensive chronic kidney disease with stage 1 through stage 4 chronic kidney disease, or unspecified chronic kidney disease: Secondary | ICD-10-CM | POA: Diagnosis not present

## 2021-05-05 ENCOUNTER — Other Ambulatory Visit: Payer: Self-pay | Admitting: Family Medicine

## 2021-05-13 DIAGNOSIS — M79644 Pain in right finger(s): Secondary | ICD-10-CM | POA: Diagnosis not present

## 2021-05-13 DIAGNOSIS — M25742 Osteophyte, left hand: Secondary | ICD-10-CM | POA: Diagnosis not present

## 2021-05-13 DIAGNOSIS — R2231 Localized swelling, mass and lump, right upper limb: Secondary | ICD-10-CM | POA: Diagnosis not present

## 2021-06-04 DIAGNOSIS — Z1231 Encounter for screening mammogram for malignant neoplasm of breast: Secondary | ICD-10-CM | POA: Diagnosis not present

## 2021-06-04 LAB — HM MAMMOGRAPHY

## 2021-06-16 ENCOUNTER — Ambulatory Visit: Payer: Medicare Other | Admitting: Internal Medicine

## 2021-06-16 ENCOUNTER — Other Ambulatory Visit: Payer: Self-pay | Admitting: Family Medicine

## 2021-06-16 ENCOUNTER — Encounter: Payer: Self-pay | Admitting: Internal Medicine

## 2021-06-16 VITALS — BP 100/56 | HR 71 | Ht 62.0 in | Wt 151.0 lb

## 2021-06-16 DIAGNOSIS — K30 Functional dyspepsia: Secondary | ICD-10-CM | POA: Diagnosis not present

## 2021-06-16 DIAGNOSIS — K219 Gastro-esophageal reflux disease without esophagitis: Secondary | ICD-10-CM | POA: Diagnosis not present

## 2021-06-16 DIAGNOSIS — D132 Benign neoplasm of duodenum: Secondary | ICD-10-CM

## 2021-06-16 NOTE — Progress Notes (Signed)
[name] 70 y.o. 1950-10-20 [id number]  Assessment & Plan:   Encounter Diagnoses  Name Primary?   Duodenal adenoma Yes   Functional dyspepsia    Gastroesophageal reflux disease, unspecified whether esophagitis present    Plan to repeat an EGD this Spring, to ensure the duodenal adenoma had been completely removed last year.  Continue medications otherwise.    Subjective:   Chief Complaint:  HPI 71 year old [place] woman with a history of functional dyspepsia, and more recently suspected fundic gland polyps and a single duodenal polyp that was a pyloric adenoma.  That was found with biopsy in 2022 and had a follow-up as outlined below. Overall doing well occasional dyspepsia treated with Gaviscon on top of her chronic PPI use. EGD 06/12/20 - Normal esophagus. - Multiple gastric polyps. Biopsied. Look like benign fundic gland polyps - Gastritis. Biopsied. Proximal stomach - A single duodenal polyp. Biopsied. Could be a prominent fold and not a true polyp - Gastroesophageal flap valve classified as Hill Grade II (fold present, opens with respiration). 1. Surgical [P], duodenal, polyp - RARE FRAGMENTS OF MONOMORPHIC PYLORIC TYPE GLANDS, SUGGESTIVE OF A PYLORIC GLAND ADENOMA - BACKGROUND DUODENAL MUCOSA WITH NO SPECIFIC HISTOPATHOLOGIC CHANGES - NEGATIVE FOR HIGH-GRADE DYSPLASIA OR CARCINOMA 2. Surgical [P], gastric antrum and body, polyp (multiple) - GASTRIC ANTRAL AND OXYNTIC MUCOSA WITH NO SPECIFIC HISTOPATHOLOGIC CHANGES - WARTHIN STARRY STAIN IS NEGATIVE FOR HELICOBACTER PYLORI - FUNDIC GLAND POLYP(S), NEGATIVE FOR DYSPLASIA   EGD 08/10/2020 A single duodenal polyp. Resected and retrieved. Tattooed. - The examination was otherwise normal.  SURGICAL PATHOLOGY  CASE: [id number]  PATIENT: [name]  Surgical Pathology Report      Clinical History: Duodenal polyp (crm)    FINAL MICROSCOPIC DIAGNOSIS:   A. DUODENUM, POLYPECTOMY:  -  Polypoid fragments of  benign duodenal mucosa  -  No acute inflammation, villous blunting or increased intraepithelial  lymphocytes identified  Wt Readings from Last 3 Encounters:  06/16/21 151 lb (68.5 kg)  11/26/20 148 lb 6.4 oz (67.3 kg)  08/06/20 149 lb (67.6 kg)    Allergies  Allergen Reactions   Sulfamethoxazole-Trimethoprim Shortness Of Breath    flu like symptoms   Tramadol Other (See Comments)    Insomnia   Lansoprazole     REACTION: headaches   Levaquin [Levofloxacin In D5w] Nausea And Vomiting   Macrobid [Nitrofurantoin Macrocrystal] Other (See Comments)    Fever Chills and Body aches   Shellfish Allergy    Penicillins Rash    Has patient had a PCN reaction causing immediate rash, facial/tongue/throat swelling, SOB or lightheadedness with hypotension: No Has patient had a PCN reaction causing severe rash involving mucus membranes or skin necrosis: No Has patient had a PCN reaction that required hospitalization No Has patient had a PCN reaction occurring within the last 10 years: No If all of the above answers are "NO", then may proceed with Cephalosporin use.    Current Meds  Medication Sig   Alum Hydroxide-Mag Trisilicate (GAVISCON) 56-31.4 MG CHEW 2 tablets at bedtime and 2 tablets as needed   amLODipine (NORVASC) 5 MG tablet TAKE 1 TABLET BY MOUTH  DAILY   b complex vitamins capsule Take 1 capsule by mouth daily.   Cholecalciferol (VITAMIN D) 50 MCG (2000 UT) CAPS Take 2,000 Units by mouth daily.   esomeprazole (NEXIUM) 40 MG capsule TAKE 1 CAPSULE BY MOUTH 2 TIMES DAILY BEFORE A MEAL (Patient taking differently: 40 mg daily.)   estradiol (ESTRACE) 0.1 MG/GM vaginal cream  Place 1 Applicatorful vaginally once a week.   hydrochlorothiazide (HYDRODIURIL) 25 MG tablet TAKE 1 TABLET(25 MG) BY MOUTH DAILY   KRILL OIL PO Take 1 tablet by mouth daily.   losartan (COZAAR) 100 MG tablet TAKE 1 TABLET(100 MG) BY MOUTH DAILY   metoprolol succinate (TOPROL-XL) 100 MG 24 hr tablet TAKE 1 TABLET BY  MOUTH  TWICE DAILY WITH OR  IMMEDIATELY FOLLOWING MEAL   Probiotic Product (PROBIOTIC DAILY PO) Take 1 tablet by mouth daily.   temazepam (RESTORIL) 30 MG capsule TAKE 1 CAPSULE(30 MG) BY MOUTH AT BEDTIME AS NEEDED FOR SLEEP   valACYclovir (VALTREX) 500 MG tablet Take 500 mg by mouth 2 (two) times daily as needed (fever blisters).   Past Medical History:  Diagnosis Date   Allergy    Anemia    Arthritis    Atherosclerosis of other specified arteries    Bilateral carpal tunnel syndrome    Cataract    GERD (gastroesophageal reflux disease)    Hemorrhoids    Hiatal hernia    Hypertension    IBS (irritable bowel syndrome)    Low back pain    Osteopenia    Renal disorder    UTI (lower urinary tract infection)    Venous insufficiency    Past Surgical History:  Procedure Laterality Date   ABDOMINAL HYSTERECTOMY     bilateral carpal tunnel release  01/2008 and 06/2005   Dr. Daylene Katayama   BREAST REDUCTION SURGERY  1999   COLONOSCOPY  12/29/2016   per Dr. Carlean Purl, hemorrhoids only, no polyps, repeat in 10 yrs    ENDOVENOUS ABLATION SAPHENOUS VEIN W/ LASER  12-15-2011   right greater saphenous vein by Curt Jews MD   ENDOVENOUS ABLATION SAPHENOUS VEIN W/ LASER  01-26-2012   left greater saphenous vein by Curt Jews MD   ESOPHAGOGASTRODUODENOSCOPY  12/29/2016   per Dr. Carlean Purl, gastritis    ESOPHAGOGASTRODUODENOSCOPY (EGD) WITH PROPOFOL N/A 08/10/2020   Procedure: ESOPHAGOGASTRODUODENOSCOPY (EGD) WITH PROPOFOL;  Surgeon: Gatha Mayer, MD;  Location: [facility];  Service: Endoscopy;  Laterality: N/A;   hemorrhoid sugery  04/2009   Dr. Dalbert Batman   left cataract and lens  07/2008   Dr. 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   Review of Systems As above  Objective:   Physical Exam BP (!) 100/56    Pulse 71    Ht 5\' 2"  (1.575 m)    Wt 151 lb (68.5 kg)    SpO2 98%    BMI 27.62 kg/m

## 2021-06-16 NOTE — Patient Instructions (Signed)
We will contact you when our May schedule comes out to set up your EGD.  If you are age 70 or older, your body mass index should be between 23-30. Your Body mass index is 27.62 kg/m. If this is out of the aforementioned range listed, please consider follow up with your Primary Care Provider.  If you are age 64 or younger, your body mass index should be between 19-25. Your Body mass index is 27.62 kg/m. If this is out of the aformentioned range listed, please consider follow up with your Primary Care Provider.   ________________________________________________________  The [facility] providers would like to encourage you to use [facility] to communicate with providers for non-urgent requests or questions.  Due to long hold times on the telephone, sending your provider a message by [facility] may be a faster and more efficient way to get a response.  Please allow 48 business hours for a response.  Please remember that this is for non-urgent requests.  _______________________________________________________  I appreciate the opportunity to care for you. Silvano Rusk, MD, [facility]

## 2021-06-18 ENCOUNTER — Encounter: Payer: Self-pay | Admitting: Family Medicine

## 2021-07-08 ENCOUNTER — Telehealth: Payer: Self-pay

## 2021-07-08 DIAGNOSIS — D132 Benign neoplasm of duodenum: Secondary | ICD-10-CM

## 2021-07-08 NOTE — Telephone Encounter (Signed)
[name] was seen in February and Dr Carlean Purl wants her to do a EGD in May. I called and set this up. Will mail her instructions and she will let me know if she has questions. ?

## 2021-08-13 ENCOUNTER — Ambulatory Visit: Payer: Medicare Other | Admitting: Family Medicine

## 2021-08-16 ENCOUNTER — Ambulatory Visit (INDEPENDENT_AMBULATORY_CARE_PROVIDER_SITE_OTHER): Payer: Medicare Other | Admitting: Family Medicine

## 2021-08-16 ENCOUNTER — Encounter: Payer: Self-pay | Admitting: Family Medicine

## 2021-08-16 VITALS — BP 126/70 | HR 67 | Temp 98.7°F | Wt 148.0 lb

## 2021-08-16 DIAGNOSIS — F418 Other specified anxiety disorders: Secondary | ICD-10-CM | POA: Diagnosis not present

## 2021-08-16 MED ORDER — LORAZEPAM 0.5 MG PO TABS: 0.5000 mg | ORAL_TABLET | Freq: Four times a day (QID) | ORAL | 1 refills | Status: DC | PRN

## 2021-08-16 NOTE — Progress Notes (Signed)
? ?  Subjective:  ? ? Patient ID: [name], female    DOB: 03-25-1951, 71 y.o.   MRN: [id number] ? ?HPI ?Here asking for help with anxiety over her dog's health issues. Her dog is 11 years old and recently had surgery to remove both her eyes. She had glaucoma and was in a lot of pain. [name] and her husband are struggling with their decision for surgery as opposed to euthanizing the dog.  ? ? ?Review of Systems  ?Constitutional: Negative.   ?Respiratory: Negative.    ?Cardiovascular: Negative.   ?Psychiatric/Behavioral:  Negative for agitation, behavioral problems, confusion, decreased concentration, dysphoric mood and sleep disturbance. The patient is nervous/anxious.   ? ?   ?Objective:  ? Physical Exam ?Constitutional:   ?   Comments: In tears   ?Cardiovascular:  ?   Rate and Rhythm: Normal rate and regular rhythm.  ?   Pulses: Normal pulses.  ?   Heart sounds: Normal heart sounds.  ?Pulmonary:  ?   Effort: Pulmonary effort is normal.  ?   Breath sounds: Normal breath sounds.  ?Neurological:  ?   Mental Status: She is alert.  ?Psychiatric:     ?   Thought Content: Thought content normal.  ?   Comments: Anxious and tearful   ? ? ? ? ? ?   ?Assessment & Plan:  ?Anxiety. She will try Lorazepam to use when needed. We anticipate this woll be for a short period of time.  ?Alysia Penna, MD ? ? ? ? ?

## 2021-08-19 ENCOUNTER — Other Ambulatory Visit: Payer: Self-pay | Admitting: Family Medicine

## 2021-08-22 ENCOUNTER — Encounter: Payer: Self-pay | Admitting: Certified Registered Nurse Anesthetist

## 2021-08-24 DIAGNOSIS — G8918 Other acute postprocedural pain: Secondary | ICD-10-CM | POA: Diagnosis not present

## 2021-08-24 DIAGNOSIS — R2231 Localized swelling, mass and lump, right upper limb: Secondary | ICD-10-CM | POA: Diagnosis not present

## 2021-08-24 DIAGNOSIS — M25741 Osteophyte, right hand: Secondary | ICD-10-CM | POA: Diagnosis not present

## 2021-08-24 DIAGNOSIS — M71341 Other bursal cyst, right hand: Secondary | ICD-10-CM | POA: Diagnosis not present

## 2021-08-24 DIAGNOSIS — M659 Synovitis and tenosynovitis, unspecified: Secondary | ICD-10-CM | POA: Diagnosis not present

## 2021-08-29 ENCOUNTER — Other Ambulatory Visit: Payer: Self-pay | Admitting: Family Medicine

## 2021-08-30 ENCOUNTER — Ambulatory Visit (AMBULATORY_SURGERY_CENTER): Payer: Medicare Other | Admitting: Internal Medicine

## 2021-08-30 ENCOUNTER — Encounter: Payer: Self-pay | Admitting: Internal Medicine

## 2021-08-30 VITALS — BP 115/42 | HR 59 | Temp 98.2°F | Resp 17 | Ht 62.0 in | Wt 151.0 lb

## 2021-08-30 DIAGNOSIS — M25531 Pain in right wrist: Secondary | ICD-10-CM | POA: Diagnosis not present

## 2021-08-30 DIAGNOSIS — S8002XA Contusion of left knee, initial encounter: Secondary | ICD-10-CM | POA: Diagnosis not present

## 2021-08-30 DIAGNOSIS — D132 Benign neoplasm of duodenum: Secondary | ICD-10-CM

## 2021-08-30 DIAGNOSIS — M25562 Pain in left knee: Secondary | ICD-10-CM | POA: Diagnosis not present

## 2021-08-30 MED ORDER — SODIUM CHLORIDE 0.9 % IV SOLN: 500.0000 mL | Freq: Once | INTRAVENOUS | Status: DC

## 2021-08-30 NOTE — Progress Notes (Signed)
Report given to PACU, vss 

## 2021-08-30 NOTE — Patient Instructions (Addendum)
The polyp in the duodenum (intestine) is gone. ? ?You still have tiny benign stomach polyps. ? ?I appreciate the opportunity to care for you. ?Gatha Mayer, MD, [facility] ? ? ?YOU HAD AN ENDOSCOPIC PROCEDURE TODAY AT [facility]:   Refer to the procedure report that was given to you for any specific questions about what was found during the examination.  If the procedure report does not answer your questions, please call your gastroenterologist to clarify.  If you requested that your care partner not be given the details of your procedure findings, then the procedure report has been included in a sealed envelope for you to review at your convenience later. ? ?YOU SHOULD EXPECT: Some feelings of bloating in the abdomen. Passage of more gas than usual.  Walking can help get rid of the air that was put into your GI tract during the procedure and reduce the bloating. If you had a lower endoscopy (such as a colonoscopy or flexible sigmoidoscopy) you may notice spotting of blood in your stool or on the toilet paper. If you underwent a bowel prep for your procedure, you may not have a normal bowel movement for a few days. ? ?Please Note:  You might notice some irritation and congestion in your nose or some drainage.  This is from the oxygen used during your procedure.  There is no need for concern and it should clear up in a day or so. ? ?SYMPTOMS TO REPORT IMMEDIATELY: ? ?Following upper endoscopy (EGD) ? Vomiting of blood or coffee ground material ? New chest pain or pain under the shoulder blades ? Painful or persistently difficult swallowing ? New shortness of breath ? Fever of 100?F or higher ? Black, tarry-looking stools ? ?For urgent or emergent issues, a gastroenterologist can be reached at any hour by calling [phone]. ?Do not use MyChart messaging for urgent concerns.  ? ? ?DIET:  We do recommend a small meal at first, but then you may proceed to your regular diet.  Drink plenty of fluids  but you should avoid alcoholic beverages for 24 hours. ? ?ACTIVITY:  You should plan to take it easy for the rest of today and you should NOT DRIVE or use heavy machinery until tomorrow (because of the sedation medicines used during the test).   ? ?FOLLOW UP: ?Our staff will call the number listed on your records 48-72 hours following your procedure to check on you and address any questions or concerns that you may have regarding the information given to you following your procedure. If we do not reach you, we will leave a message.  We will attempt to reach you two times.  During this call, we will ask if you have developed any symptoms of COVID 19. If you develop any symptoms (ie: fever, flu-like symptoms, shortness of breath, cough etc.) before then, please call [phone].  If you test positive for Covid 19 in the 2 weeks post procedure, please call and report this information to [place].   ? ?SIGNATURES/CONFIDENTIALITY: ?You and/or your care partner have signed paperwork which will be entered into your electronic medical record.  These signatures attest to the fact that that the information above on your After Visit Summary has been reviewed and is understood.  Full responsibility of the confidentiality of this discharge information lies with you and/or your care-partner.  ?

## 2021-08-30 NOTE — Op Note (Signed)
[facility] ?Patient Name: [name] ?Procedure Date: 08/30/2021 9:24 AM ?MRN: [id number] ?Endoscopist: Gatha Mayer , MD ?Age: 71 ?Referring MD:  ?Date of Birth: 1950/05/17 ?Gender: Female ?Account #: [id number] ?Procedure:                Upper GI endoscopy ?Indications:              Follow-up of polyps in the duodenum ?Medicines:                Monitored Anesthesia Care ?Procedure:                Pre-Anesthesia Assessment: ?                          - Prior to the procedure, a History and Physical  ?                          was performed, and patient medications and  ?                          allergies were reviewed. The patient's tolerance of  ?                          previous anesthesia was also reviewed. The risks  ?                          and benefits of the procedure and the sedation  ?                          options and risks were discussed with the patient.  ?                          All questions were answered, and informed consent  ?                          was obtained. Prior Anticoagulants: The patient has  ?                          taken no previous anticoagulant or antiplatelet  ?                          agents. ASA Grade Assessment: II - A patient with  ?                          mild systemic disease. After reviewing the risks  ?                          and benefits, the patient was deemed in  ?                          satisfactory condition to undergo the procedure. ?                          After obtaining informed consent, the endoscope was  ?  passed under direct vision. Throughout the  ?                          procedure, the patient's blood pressure, pulse, and  ?                          oxygen saturations were monitored continuously. The  ?                          Endoscope was introduced through the mouth, and  ?                          advanced to the second part of duodenum. The upper  ?                          GI endoscopy was  accomplished without difficulty.  ?                          The patient tolerated the procedure well. ?Scope In: ?Scope Out: ?Findings:                 A tattoo was seen in the second portion of the  ?                          duodenum. No residual polyp seen. ?                          Multiple diminutive sessile polyps were found in  ?                          the gastric fundus and in the gastric body. ?                          The exam was otherwise without abnormality. ?                          The cardia and gastric fundus were normal on  ?                          retroflexion. ?Complications:            No immediate complications. ?Estimated Blood Loss:     Estimated blood loss: none. ?Impression:               - A tattoo was seen in the duodenum. No signs of  ?                          polyp(s) ?                          - Multiple gastric polyps. Known benign fundic  ?                          gland polyps. Mucosa slightly erythematous in areas  ?                          (  prior biopsies normal) ?                          - The examination was otherwise normal. ?                          - No specimens collected. ?Recommendation:           - Patient has a contact number available for  ?                          emergencies. The signs and symptoms of potential  ?                          delayed complications were discussed with the  ?                          patient. Return to normal activities tomorrow.  ?                          Written discharge instructions were provided to the  ?                          patient. ?                          - Resume previous diet. ?                          - Continue present medications. ?                          - Return to my office PRN. ?Gatha Mayer, MD ?08/30/2021 10:04:24 AM ?This report has been signed electronically. ?

## 2021-08-30 NOTE — Progress Notes (Signed)
[facility] History and Physical ? ? ?Primary Care Physician:  [name], MD ? ? ?Reason for Procedure:   F/u duodenal adenoma ? ?Plan:    EGD ? ? ? ? ?HPI: Lou Irigoyen is a 71 y.o. female s/p resection of duodenal adenoma 2022 here for surveillance examination. 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 ? ? ?Past Medical History:  ?Diagnosis Date  ? Allergy   ? Anemia   ? Arthritis   ? Atherosclerosis of other specified arteries   ? Bilateral carpal tunnel syndrome   ? Cataract   ? GERD (gastroesophageal reflux disease)   ? Hemorrhoids   ? Hiatal hernia   ? Hypertension   ? IBS (irritable bowel syndrome)   ? Low back pain   ? Osteopenia   ? Renal disorder   ? UTI (lower urinary tract infection)   ? Venous insufficiency   ? ? ?Past Surgical History:  ?Procedure Laterality Date  ? ABDOMINAL HYSTERECTOMY    ? bilateral carpal tunnel release  01/2008 and 06/2005  ? Dr. Daylene Katayama  ? BREAST REDUCTION SURGERY  1999  ? COLONOSCOPY  12/29/2016  ? per Dr. Carlean Purl, hemorrhoids only, no polyps, repeat in 10 yrs   ? ENDOVENOUS ABLATION SAPHENOUS VEIN W/ LASER  12-15-2011  ? right greater saphenous vein by Curt Jews MD  ? ENDOVENOUS ABLATION SAPHENOUS VEIN W/ LASER  01-26-2012  ? left greater saphenous vein by Curt Jews MD  ? ESOPHAGOGASTRODUODENOSCOPY  12/29/2016  ? per Dr. Carlean Purl, gastritis   ? ESOPHAGOGASTRODUODENOSCOPY (EGD) WITH PROPOFOL N/A 08/10/2020  ? Procedure: ESOPHAGOGASTRODUODENOSCOPY (EGD) WITH PROPOFOL;  Surgeon: Gatha Mayer, MD;  Location: [facility];  Service: Endoscopy;  Laterality: N/A;  ? hemorrhoid sugery  04/2009  ? Dr. Dalbert Batman  ? left cataract and lens  07/2008  ? Dr. Ellie Lunch  ? POLYPECTOMY  08/10/2020  ? Procedure: POLYPECTOMY;  Surgeon: Gatha Mayer, MD;  Location: [facility];  Service: Endoscopy;;  ? SUBMUCOSAL TATTOO INJECTION  08/10/2020  ? Procedure: SUBMUCOSAL TATTOO INJECTION;  Surgeon: Gatha Mayer, MD;  Location: [facility];  Service:  Endoscopy;;  ? ? ?Prior to Admission medications   ?Medication Sig Start Date End Date Taking? Authorizing Provider  ?amLODipine (NORVASC) 5 MG tablet TAKE 1 TABLET BY MOUTH  DAILY 03/19/21  Yes [name], MD  ?b complex vitamins capsule Take 1 capsule by mouth daily.   Yes [provider]  ?Cholecalciferol (VITAMIN D) 50 MCG (2000 UT) CAPS Take 2,000 Units by mouth daily.   Yes [provider]  ?esomeprazole (NEXIUM) 40 MG capsule TAKE 1 CAPSULE BY MOUTH 2 TIMES DAILY BEFORE A MEAL ?Patient taking differently: 40 mg daily. 02/04/21  Yes Gatha Mayer, MD  ?hydrochlorothiazide (HYDRODIURIL) 25 MG tablet TAKE 1 TABLET(25 MG) BY MOUTH DAILY 05/05/21  Yes [name], MD  ?KRILL OIL PO Take 1 tablet by mouth daily.   Yes [provider]  ?losartan (COZAAR) 100 MG tablet TAKE 1 TABLET(100 MG) BY MOUTH DAILY 08/19/21  Yes [name], MD  ?metoprolol succinate (TOPROL-XL) 100 MG 24 hr tablet TAKE 1 TABLET BY MOUTH  TWICE DAILY WITH OR  IMMEDIATELY FOLLOWING MEAL 03/19/21  Yes [name], MD  ?Probiotic Product (PROBIOTIC DAILY PO) Take 1 tablet by mouth daily.   Yes [provider]  ?valACYclovir (VALTREX) 500 MG tablet Take 500 mg by mouth 2 (two) times daily as needed (fever blisters). 06/03/14  Yes [provider]  ?Alum Hydroxide-Mag Trisilicate (GAVISCON) 95-63.8 MG CHEW 2 tablets at bedtime and 2 tablets as needed ?Patient not taking: Reported on 08/30/2021 07/02/20   Gatha Mayer, MD  ?estradiol (ESTRACE) 0.1 MG/GM vaginal cream Place 1 Applicatorful vaginally once a week. 10/01/19   [provider]  ?LORazepam (ATIVAN) 0.5 MG tablet Take 1 tablet (0.5 mg total) by mouth every 6 (six) hours as needed for anxiety. ?Patient not taking: Reported on 08/30/2021 08/16/21   [name], MD  ?temazepam (RESTORIL) 30 MG capsule TAKE 1 CAPSULE(30 MG) BY MOUTH AT BEDTIME AS NEEDED FOR SLEEP 02/09/21   [name], MD  ? ? ?Current Outpatient Medications   ?Medication Sig Dispense Refill  ? amLODipine (NORVASC) 5 MG tablet TAKE 1 TABLET BY MOUTH  DAILY 90 tablet 3  ? b complex vitamins capsule Take 1 capsule by mouth daily.    ? Cholecalciferol (VITAMIN D) 50 MCG (2000 UT) CAPS Take 2,000 Units by mouth daily.    ? esomeprazole (NEXIUM) 40 MG capsule TAKE 1 CAPSULE BY MOUTH 2 TIMES DAILY BEFORE A MEAL (Patient taking differently: 40 mg daily.) 180 capsule 3  ? hydrochlorothiazide (HYDRODIURIL) 25 MG tablet TAKE 1 TABLET(25 MG) BY MOUTH DAILY 90 tablet 0  ? KRILL OIL PO Take 1 tablet by mouth daily.    ? losartan (COZAAR) 100 MG tablet TAKE 1 TABLET(100 MG) BY MOUTH DAILY 90 tablet 0  ? metoprolol succinate (TOPROL-XL) 100 MG 24 hr tablet TAKE 1 TABLET BY MOUTH  TWICE DAILY WITH OR  IMMEDIATELY FOLLOWING MEAL 180 tablet 3  ? Probiotic Product (PROBIOTIC DAILY PO) Take 1 tablet by mouth daily.    ? valACYclovir (VALTREX) 500 MG tablet Take 500 mg by mouth 2 (two) times daily as needed (fever blisters).  3  ? Alum Hydroxide-Mag Trisilicate (GAVISCON) 75-64.3 MG CHEW 2 tablets at bedtime and 2 tablets as needed (Patient not taking: Reported on 08/30/2021) 224 tablet   ? estradiol (ESTRACE) 0.1 MG/GM vaginal cream Place 1 Applicatorful vaginally once a week.    ? LORazepam (ATIVAN) 0.5 MG tablet Take 1 tablet (0.5 mg total) by mouth every 6 (six) hours as needed for anxiety. (Patient not taking: Reported on 08/30/2021) 60 tablet 1  ? temazepam (RESTORIL) 30 MG capsule TAKE 1 CAPSULE(30 MG) BY MOUTH AT BEDTIME AS NEEDED FOR SLEEP 30 capsule 5  ? ?Current Facility-Administered Medications  ?Medication Dose Route Frequency Provider Last Rate Last Admin  ? 0.9 %  sodium chloride infusion  500 mL Intravenous Once Gatha Mayer, MD      ? ? ?Allergies as of 08/30/2021 - Review Complete 08/30/2021  ?Allergen Reaction Noted  ? Keflex [cephalexin] Anaphylaxis 08/30/2021  ? Sulfamethoxazole-trimethoprim Shortness Of Breath   ? Tramadol Other (See Comments) 01/20/2015  ? Lansoprazole   06/03/2009  ? Levaquin [levofloxacin in d5w] Nausea And Vomiting 10/03/2017  ? Macrobid [nitrofurantoin macrocrystal] Other (See Comments) 03/19/2019  ? Shellfish allergy  11/26/2015  ? Penicillins Rash   ? ? ?Family History  ?Problem Relation Age of Onset  ? Stroke Mother   ? COPD Father   ? Lung cancer Sister   ?     smoked  ? Colon cancer Neg Hx   ? Esophageal cancer Neg Hx   ? Pancreatic cancer Neg Hx   ? Rectal cancer Neg Hx   ? Stomach cancer Neg Hx   ? ? ?Social History  ? ?Socioeconomic History  ? Marital status:  Married  ?  Spouse name: Not on file  ? Number of children: 2  ? Years of education: Not on file  ? Highest education level: Not on file  ?Occupational History  ? Occupation: retired  ?Tobacco Use  ? Smoking status: Never  ? Smokeless tobacco: Never  ?Vaping Use  ? Vaping Use: Never used  ?Substance and Sexual Activity  ? Alcohol use: No  ? Drug use: No  ? Sexual activity: Not Currently  ?Other Topics Concern  ? Not on file  ?Social History Narrative  ? Married, originally from [place]  ? 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 1 son one daughter both adults daughter in [place] area son in [place], [place]  ? No EtOH/tobacco/drugs  ? ? ? ? ?Review of Systems: ?Recent fall and minor injuries ?All other review of systems negative except as mentioned in the HPI. ? ?Physical Exam: ?Vital signs ?BP (!) 121/58   Pulse 67   Temp 98.2 ?F (36.8 ?C) (Temporal)   Ht '5\' 2"'$  (1.575 m)   Wt 151 lb (68.5 kg)   SpO2 99%   BMI 27.62 kg/m?  ? ?General:   Alert,  Well-developed, well-nourished, pleasant and cooperative in NAD ?Lungs:  Clear throughout to auscultation.   ?Heart:  Regular rate and rhythm; no murmurs, clicks, rubs,  or gallops. ?Abdomen:  Soft, nontender and nondistended. Normal bowel sounds.   ?Neuro/Psych:  Alert and cooperative. Normal mood and affect. A and O x 3 ? ? ?'@Glendi Mohiuddin'$  Simonne Maffucci, MD, [facility] ?[facility] ?[phone] (pager) ?08/30/2021 9:43 AM@ ? ?

## 2021-08-30 NOTE — Progress Notes (Signed)
0945 Robinul 0.1 mg IV given due large amount of secretions upon assessment.  MD made aware, vss. Pt states she fell after surgery on hand and was told to please call her surgeon after today's case.  ?

## 2021-09-01 ENCOUNTER — Telehealth: Payer: Self-pay | Admitting: *Deleted

## 2021-09-01 NOTE — Telephone Encounter (Signed)
?  Follow up Call- ? ? ?  08/30/2021  ?  9:31 AM 06/12/2020  ?  8:46 AM  ?Call back number  ?Post procedure Call Back phone  # [phone]  ?Permission to leave phone message Yes Yes  ?  ? ?Patient questions: ? ?Do you have a fever, pain , or abdominal swelling? No. ?Pain Score  0 * ? ?Have you tolerated food without any problems? Yes.   ? ?Have you been able to return to your normal activities? Yes.   ? ?Do you have any questions about your discharge instructions: ?Diet   No. ?Medications  No. ?Follow up visit  No. ? ?Do you have questions or concerns about your Care? No. ? ?Actions: ?* If pain score is 4 or above: ?No action needed, pain <4. ? ? ?

## 2021-09-13 DIAGNOSIS — M79644 Pain in right finger(s): Secondary | ICD-10-CM | POA: Diagnosis not present

## 2021-11-25 ENCOUNTER — Other Ambulatory Visit: Payer: Self-pay | Admitting: Family Medicine

## 2021-11-29 ENCOUNTER — Telehealth: Payer: Self-pay | Admitting: Family Medicine

## 2021-11-29 DIAGNOSIS — R739 Hyperglycemia, unspecified: Secondary | ICD-10-CM

## 2021-11-29 DIAGNOSIS — E785 Hyperlipidemia, unspecified: Secondary | ICD-10-CM

## 2021-11-29 DIAGNOSIS — I10 Essential (primary) hypertension: Secondary | ICD-10-CM

## 2021-11-29 NOTE — Telephone Encounter (Signed)
Patient wants to do her labs on the 2nd and come in for her physical on Friday the 11th at 1:00 due to fasting requirements. Patient will need orders to be placed in order to have labs before appointment.     Please advise

## 2021-11-30 NOTE — Telephone Encounter (Signed)
The lab orders are ready

## 2021-11-30 NOTE — Telephone Encounter (Signed)
Spoke with patient, lab appointment scheduled for 12/01/21 at 8am

## 2021-12-01 ENCOUNTER — Encounter: Payer: Medicare Other | Admitting: Family Medicine

## 2021-12-01 ENCOUNTER — Other Ambulatory Visit (INDEPENDENT_AMBULATORY_CARE_PROVIDER_SITE_OTHER): Payer: Medicare Other

## 2021-12-01 DIAGNOSIS — E785 Hyperlipidemia, unspecified: Secondary | ICD-10-CM | POA: Diagnosis not present

## 2021-12-01 DIAGNOSIS — R739 Hyperglycemia, unspecified: Secondary | ICD-10-CM | POA: Diagnosis not present

## 2021-12-01 LAB — LIPID PANEL
Cholesterol: 172 mg/dL (ref 0–200)
HDL: 36.5 mg/dL — ABNORMAL LOW (ref >39.00)
NonHDL: 135.82
Total CHOL/HDL Ratio: 5
Triglycerides: 236 mg/dL — ABNORMAL HIGH (ref 0.0–149.0)
VLDL: 47.2 mg/dL — ABNORMAL HIGH (ref 0.0–40.0)

## 2021-12-01 LAB — HEPATIC FUNCTION PANEL
ALT: 17 U/L (ref 0–35)
AST: 25 U/L (ref 0–37)
Albumin: 4.4 g/dL (ref 3.5–5.2)
Alkaline Phosphatase: 45 U/L (ref 39–117)
Bilirubin, Direct: 0.2 mg/dL (ref 0.0–0.3)
Total Bilirubin: 0.8 mg/dL (ref 0.2–1.2)
Total Protein: 7.4 g/dL (ref 6.0–8.3)

## 2021-12-01 LAB — CBC WITH DIFFERENTIAL/PLATELET
Basophils Absolute: 0 10*3/uL (ref 0.0–0.1)
Basophils Relative: 1.1 % (ref 0.0–3.0)
Eosinophils Absolute: 0.1 10*3/uL (ref 0.0–0.7)
Eosinophils Relative: 2.1 % (ref 0.0–5.0)
HCT: 39 % (ref 36.0–46.0)
Hemoglobin: 13.5 g/dL (ref 12.0–15.0)
Lymphocytes Relative: 36.9 % (ref 12.0–46.0)
Lymphs Abs: 1.4 10*3/uL (ref 0.7–4.0)
MCHC: 34.5 g/dL (ref 30.0–36.0)
MCV: 94.4 fl (ref 78.0–100.0)
Monocytes Absolute: 0.3 10*3/uL (ref 0.1–1.0)
Monocytes Relative: 8.8 % (ref 3.0–12.0)
Neutro Abs: 1.9 10*3/uL (ref 1.4–7.7)
Neutrophils Relative %: 51.1 % (ref 43.0–77.0)
Platelets: 198 10*3/uL (ref 150.0–400.0)
RBC: 4.13 Mil/uL (ref 3.87–5.11)
RDW: 12.6 % (ref 11.5–15.5)
WBC: 3.8 10*3/uL — ABNORMAL LOW (ref 4.0–10.5)

## 2021-12-01 LAB — HEMOGLOBIN A1C: Hgb A1c MFr Bld: 5.9 % (ref 4.6–6.5)

## 2021-12-01 LAB — BASIC METABOLIC PANEL
BUN: 21 mg/dL (ref 6–23)
CO2: 29 mEq/L (ref 19–32)
Calcium: 9.9 mg/dL (ref 8.4–10.5)
Chloride: 101 mEq/L (ref 96–112)
Creatinine, Ser: 1.03 mg/dL (ref 0.40–1.20)
GFR: 55.26 mL/min — ABNORMAL LOW (ref >60.00)
Glucose, Bld: 96 mg/dL (ref 70–99)
Potassium: 4 mEq/L (ref 3.5–5.1)
Sodium: 138 mEq/L (ref 135–145)

## 2021-12-01 LAB — TSH: TSH: 2.1 u[IU]/mL (ref 0.35–5.50)

## 2021-12-01 LAB — LDL CHOLESTEROL, DIRECT: Direct LDL: 93 mg/dL

## 2021-12-10 ENCOUNTER — Encounter: Payer: Self-pay | Admitting: Family Medicine

## 2021-12-10 ENCOUNTER — Ambulatory Visit (INDEPENDENT_AMBULATORY_CARE_PROVIDER_SITE_OTHER): Payer: Medicare Other | Admitting: Family Medicine

## 2021-12-10 VITALS — BP 100/60 | HR 68 | Temp 98.0°F | Ht 62.0 in | Wt 150.0 lb

## 2021-12-10 DIAGNOSIS — E785 Hyperlipidemia, unspecified: Secondary | ICD-10-CM

## 2021-12-10 DIAGNOSIS — I872 Venous insufficiency (chronic) (peripheral): Secondary | ICD-10-CM | POA: Diagnosis not present

## 2021-12-10 DIAGNOSIS — M159 Polyosteoarthritis, unspecified: Secondary | ICD-10-CM | POA: Diagnosis not present

## 2021-12-10 DIAGNOSIS — G8929 Other chronic pain: Secondary | ICD-10-CM | POA: Diagnosis not present

## 2021-12-10 DIAGNOSIS — R0602 Shortness of breath: Secondary | ICD-10-CM | POA: Diagnosis not present

## 2021-12-10 DIAGNOSIS — M545 Low back pain, unspecified: Secondary | ICD-10-CM

## 2021-12-10 DIAGNOSIS — I10 Essential (primary) hypertension: Secondary | ICD-10-CM | POA: Diagnosis not present

## 2021-12-10 DIAGNOSIS — R739 Hyperglycemia, unspecified: Secondary | ICD-10-CM

## 2021-12-10 DIAGNOSIS — N3281 Overactive bladder: Secondary | ICD-10-CM | POA: Diagnosis not present

## 2021-12-10 DIAGNOSIS — K219 Gastro-esophageal reflux disease without esophagitis: Secondary | ICD-10-CM | POA: Diagnosis not present

## 2021-12-10 MED ORDER — LOSARTAN POTASSIUM 100 MG PO TABS: ORAL_TABLET | ORAL | 3 refills | Status: DC

## 2021-12-10 MED ORDER — AMLODIPINE BESYLATE 5 MG PO TABS: 5.0000 mg | ORAL_TABLET | Freq: Every day | ORAL | 3 refills | Status: DC

## 2021-12-10 MED ORDER — HYDROCHLOROTHIAZIDE 25 MG PO TABS: ORAL_TABLET | ORAL | 3 refills | Status: DC

## 2021-12-10 MED ORDER — SOLIFENACIN SUCCINATE 5 MG PO TABS: 5.0000 mg | ORAL_TABLET | Freq: Every day | ORAL | 2 refills | Status: DC

## 2021-12-10 MED ORDER — METOPROLOL SUCCINATE ER 100 MG PO TB24: ORAL_TABLET | ORAL | 3 refills | Status: DC

## 2021-12-10 MED ORDER — TEMAZEPAM 15 MG PO CAPS: 15.0000 mg | ORAL_CAPSULE | Freq: Every evening | ORAL | 0 refills | Status: DC | PRN

## 2021-12-10 NOTE — Progress Notes (Signed)
Subjective:    Patient ID: [name], female    DOB: 01-26-1951, 71 y.o.   MRN: [id number]  HPI Here to follow up on issues. Her BP has been stable. Her GERD is stable. Her OA is stable. She had fasting labs done on 12-01-21, and these were remarkable only for a TG that has risen to 236. She says she eats a healthy low fat diet. She takes 500 mg of omega 3 fatty acids (one capsule) every day. She does complain of having to get up to urinate 3-4 times at night. During the day she also has urgency to urinate but no discomfort. She also mentions some mild SOB that comes and goes. No chest pain. This is not related to exertion.    Review of Systems  Constitutional: Negative.   HENT: Negative.    Eyes: Negative.   Respiratory: Negative.    Cardiovascular: Negative.   Gastrointestinal: Negative.   Genitourinary:  Positive for urgency. Negative for decreased urine volume, difficulty urinating, dyspareunia, dysuria, enuresis, flank pain, frequency, hematuria and pelvic pain.  Musculoskeletal:  Positive for arthralgias.  Skin: Negative.   Neurological: Negative.  Negative for headaches.  Psychiatric/Behavioral: Negative.         Objective:   Physical Exam Constitutional:      General: She is not in acute distress.    Appearance: Normal appearance. She is well-developed.  HENT:     Head: Normocephalic and atraumatic.     Right Ear: External ear normal.     Left Ear: External ear normal.     Nose: Nose normal.     Mouth/Throat:     Pharynx: No oropharyngeal exudate.  Eyes:     General: No scleral icterus.    Conjunctiva/sclera: Conjunctivae normal.     Pupils: Pupils are equal, round, and reactive to light.  Neck:     Thyroid: No thyromegaly.     Vascular: No JVD.  Cardiovascular:     Rate and Rhythm: Normal rate and regular rhythm.     Heart sounds: Normal heart sounds. No murmur heard.    No friction rub. No gallop.     Comments: EKG today is unremarkable  Pulmonary:      Effort: Pulmonary effort is normal. No respiratory distress.     Breath sounds: Normal breath sounds. No wheezing or rales.  Chest:     Chest wall: No tenderness.  Abdominal:     General: Bowel sounds are normal. There is no distension.     Palpations: Abdomen is soft. There is no mass.     Tenderness: There is no abdominal tenderness. There is no guarding or rebound.  Musculoskeletal:        General: No tenderness. Normal range of motion.     Cervical back: Normal range of motion and neck supple.  Lymphadenopathy:     Cervical: No cervical adenopathy.  Skin:    General: Skin is warm and dry.     Findings: No erythema or rash.  Neurological:     Mental Status: She is alert and oriented to person, place, and time.     Cranial Nerves: No cranial nerve deficit.     Motor: No abnormal muscle tone.     Coordination: Coordination normal.     Deep Tendon Reflexes: Reflexes are normal and symmetric. Reflexes normal.  Psychiatric:        Behavior: Behavior normal.        Thought Content: Thought content normal.  Judgment: Judgment normal.           Assessment & Plan:  Her HTN and GRED are stable. Her renal insufficiency is stable. Her OA is stable. Her TG is highs so she will increase her daily omega 3 fatty acids to 1000 mg (2 capsules) a day. She has OAB so she will try taking Vesicare 5 mg daily. We spent a total of ( 31  ) minutes reviewing records and discussing these issues.  Alysia Penna, MD

## 2022-01-07 DIAGNOSIS — N1831 Chronic kidney disease, stage 3a: Secondary | ICD-10-CM | POA: Diagnosis not present

## 2022-01-07 DIAGNOSIS — I129 Hypertensive chronic kidney disease with stage 1 through stage 4 chronic kidney disease, or unspecified chronic kidney disease: Secondary | ICD-10-CM | POA: Diagnosis not present

## 2022-01-25 ENCOUNTER — Ambulatory Visit (INDEPENDENT_AMBULATORY_CARE_PROVIDER_SITE_OTHER): Payer: Medicare Other | Admitting: Family Medicine

## 2022-01-25 ENCOUNTER — Encounter: Payer: Self-pay | Admitting: Family Medicine

## 2022-01-25 ENCOUNTER — Ambulatory Visit (INDEPENDENT_AMBULATORY_CARE_PROVIDER_SITE_OTHER): Payer: Medicare Other

## 2022-01-25 VITALS — BP 120/62 | HR 64 | Temp 98.0°F | Wt 146.4 lb

## 2022-01-25 DIAGNOSIS — G8929 Other chronic pain: Secondary | ICD-10-CM

## 2022-01-25 DIAGNOSIS — M542 Cervicalgia: Secondary | ICD-10-CM

## 2022-01-25 DIAGNOSIS — G44229 Chronic tension-type headache, not intractable: Secondary | ICD-10-CM | POA: Diagnosis not present

## 2022-01-25 MED ORDER — METHOCARBAMOL 500 MG PO TABS: 500.0000 mg | ORAL_TABLET | Freq: Four times a day (QID) | ORAL | 3 refills | Status: DC | PRN

## 2022-01-25 MED ORDER — METOPROLOL SUCCINATE ER 100 MG PO TB24: 100.0000 mg | ORAL_TABLET | Freq: Every day | ORAL | 3 refills | Status: DC

## 2022-01-25 NOTE — Progress Notes (Signed)
   Subjective:    Patient ID: [name], female    DOB: Sep 26, 1950, 71 y.o.   MRN: [id number]  HPI Here for 6 weeks of pain in the posterior neck that radiates up to the left side of her head. No recent trauma. The pain in the neck is intermittent but it lasts for 5-30 minutes at a time. The headaches are fleeting and she describes them as sharp stabs of pain in the left parietal and temple areas. No light sensitivity. No nausea. She has tried moist heat, 1 cupping session, and 3 massages but none of these have helped. She gets some relief with Tylenol. She cannot take NAIDs due to her kidney disease.    Review of Systems  Constitutional: Negative.   HENT: Negative.    Eyes: Negative.   Respiratory: Negative.    Cardiovascular: Negative.   Musculoskeletal:  Positive for neck pain.  Neurological:  Positive for headaches. Negative for dizziness, tremors, seizures, syncope, facial asymmetry, speech difficulty, weakness, light-headedness and numbness.       Objective:   Physical Exam Constitutional:      General: She is not in acute distress.    Appearance: Normal appearance.  Eyes:     Extraocular Movements: Extraocular movements intact.     Pupils: Pupils are equal, round, and reactive to light.  Neck:     Comments: She is tender directly over the spine at the C5 to C7 area, as well as over both trapezius muscles. ROM of the neck is full  Neurological:     General: No focal deficit present.     Mental Status: She is alert and oriented to person, place, and time. Mental status is at baseline.           Assessment & Plan:  Chronic neck pain and left sided headache, which is likely to be a tension headache. We will get Xrays of her cervical spine today. She will try applying heat as well as taking Robaxin every 6 hours as needed. She may add Tylenol as needed.  Alysia Penna, MD

## 2022-01-26 DIAGNOSIS — H5213 Myopia, bilateral: Secondary | ICD-10-CM | POA: Diagnosis not present

## 2022-01-26 DIAGNOSIS — Z961 Presence of intraocular lens: Secondary | ICD-10-CM | POA: Diagnosis not present

## 2022-01-28 ENCOUNTER — Ambulatory Visit (INDEPENDENT_AMBULATORY_CARE_PROVIDER_SITE_OTHER): Payer: Medicare Other | Admitting: *Deleted

## 2022-01-28 DIAGNOSIS — Z23 Encounter for immunization: Secondary | ICD-10-CM | POA: Diagnosis not present

## 2022-02-10 ENCOUNTER — Other Ambulatory Visit: Payer: Self-pay | Admitting: Internal Medicine

## 2022-03-10 ENCOUNTER — Other Ambulatory Visit: Payer: Self-pay | Admitting: Family Medicine

## 2022-03-11 NOTE — Telephone Encounter (Signed)
Ok to send one year supply per pt pharmacy

## 2022-03-14 NOTE — Telephone Encounter (Signed)
Yes refill for one year

## 2022-03-15 NOTE — Telephone Encounter (Signed)
I already did this yesterday  

## 2022-04-07 ENCOUNTER — Other Ambulatory Visit: Payer: Self-pay | Admitting: Family Medicine

## 2022-04-22 DIAGNOSIS — M8580 Other specified disorders of bone density and structure, unspecified site: Secondary | ICD-10-CM | POA: Diagnosis not present

## 2022-04-27 DIAGNOSIS — M654 Radial styloid tenosynovitis [de Quervain]: Secondary | ICD-10-CM | POA: Diagnosis not present

## 2022-04-27 DIAGNOSIS — M25532 Pain in left wrist: Secondary | ICD-10-CM | POA: Diagnosis not present

## 2022-05-12 ENCOUNTER — Other Ambulatory Visit: Payer: Self-pay | Admitting: Internal Medicine

## 2022-06-07 DIAGNOSIS — Z1231 Encounter for screening mammogram for malignant neoplasm of breast: Secondary | ICD-10-CM | POA: Diagnosis not present

## 2022-06-07 DIAGNOSIS — M8589 Other specified disorders of bone density and structure, multiple sites: Secondary | ICD-10-CM | POA: Diagnosis not present

## 2022-06-07 LAB — HM MAMMOGRAPHY

## 2022-06-07 LAB — HM DEXA SCAN

## 2022-06-14 DIAGNOSIS — M8580 Other specified disorders of bone density and structure, unspecified site: Secondary | ICD-10-CM | POA: Diagnosis not present

## 2022-08-15 DIAGNOSIS — M654 Radial styloid tenosynovitis [de Quervain]: Secondary | ICD-10-CM | POA: Diagnosis not present

## 2022-08-22 ENCOUNTER — Other Ambulatory Visit: Payer: Self-pay | Admitting: Internal Medicine

## 2022-09-19 ENCOUNTER — Other Ambulatory Visit: Payer: Self-pay | Admitting: Family Medicine

## 2022-10-21 ENCOUNTER — Telehealth: Payer: Self-pay | Admitting: Family Medicine

## 2022-10-21 DIAGNOSIS — R739 Hyperglycemia, unspecified: Secondary | ICD-10-CM

## 2022-10-21 DIAGNOSIS — E785 Hyperlipidemia, unspecified: Secondary | ICD-10-CM

## 2022-10-21 NOTE — Telephone Encounter (Signed)
Pt will be here on 12/13/22 for her CPE.  Pt would like to come in on 12/06/19 for her labs.  Please advise.

## 2022-10-24 NOTE — Telephone Encounter (Signed)
I put in the lab orders  

## 2022-10-25 NOTE — Telephone Encounter (Signed)
Thank you, Dr. Fry 

## 2022-12-03 ENCOUNTER — Other Ambulatory Visit: Payer: Self-pay | Admitting: Family Medicine

## 2022-12-06 ENCOUNTER — Other Ambulatory Visit: Payer: Medicare Other

## 2022-12-13 ENCOUNTER — Encounter: Payer: Medicare Other | Admitting: Family Medicine

## 2022-12-14 ENCOUNTER — Other Ambulatory Visit: Payer: Self-pay | Admitting: Family Medicine

## 2022-12-16 ENCOUNTER — Other Ambulatory Visit: Payer: Medicare Other

## 2022-12-16 DIAGNOSIS — E785 Hyperlipidemia, unspecified: Secondary | ICD-10-CM

## 2022-12-16 DIAGNOSIS — R739 Hyperglycemia, unspecified: Secondary | ICD-10-CM | POA: Diagnosis not present

## 2022-12-16 LAB — CBC WITH DIFFERENTIAL/PLATELET
Basophils Absolute: 0.1 10*3/uL (ref 0.0–0.1)
Basophils Relative: 1.2 % (ref 0.0–3.0)
Eosinophils Absolute: 0.2 10*3/uL (ref 0.0–0.7)
Eosinophils Relative: 3.4 % (ref 0.0–5.0)
HCT: 40.7 % (ref 36.0–46.0)
Hemoglobin: 13.5 g/dL (ref 12.0–15.0)
Lymphocytes Relative: 37.8 % (ref 12.0–46.0)
Lymphs Abs: 1.7 10*3/uL (ref 0.7–4.0)
MCHC: 33.2 g/dL (ref 30.0–36.0)
MCV: 93.8 fl (ref 78.0–100.0)
Monocytes Absolute: 0.4 10*3/uL (ref 0.1–1.0)
Monocytes Relative: 8.8 % (ref 3.0–12.0)
Neutro Abs: 2.2 10*3/uL (ref 1.4–7.7)
Neutrophils Relative %: 48.8 % (ref 43.0–77.0)
Platelets: 222 10*3/uL (ref 150.0–400.0)
RBC: 4.34 Mil/uL (ref 3.87–5.11)
RDW: 12.6 % (ref 11.5–15.5)
WBC: 4.6 10*3/uL (ref 4.0–10.5)

## 2022-12-16 LAB — HEPATIC FUNCTION PANEL
ALT: 14 U/L (ref 0–35)
AST: 19 U/L (ref 0–37)
Albumin: 4.4 g/dL (ref 3.5–5.2)
Alkaline Phosphatase: 54 U/L (ref 39–117)
Bilirubin, Direct: 0.1 mg/dL (ref 0.0–0.3)
Total Bilirubin: 0.8 mg/dL (ref 0.2–1.2)
Total Protein: 7.3 g/dL (ref 6.0–8.3)

## 2022-12-16 LAB — HEMOGLOBIN A1C: Hgb A1c MFr Bld: 5.6 % (ref 4.6–6.5)

## 2022-12-16 LAB — BASIC METABOLIC PANEL
BUN: 20 mg/dL (ref 6–23)
CO2: 28 mEq/L (ref 19–32)
Calcium: 9.6 mg/dL (ref 8.4–10.5)
Chloride: 99 mEq/L (ref 96–112)
Creatinine, Ser: 1.05 mg/dL (ref 0.40–1.20)
GFR: 53.61 mL/min — ABNORMAL LOW (ref >60.00)
Glucose, Bld: 99 mg/dL (ref 70–99)
Potassium: 3.6 mEq/L (ref 3.5–5.1)
Sodium: 131 mEq/L — ABNORMAL LOW (ref 135–145)

## 2022-12-16 LAB — LIPID PANEL
Cholesterol: 184 mg/dL (ref 0–200)
HDL: 38.6 mg/dL — ABNORMAL LOW (ref >39.00)
NonHDL: 145.26
Total CHOL/HDL Ratio: 5
Triglycerides: 205 mg/dL — ABNORMAL HIGH (ref 0.0–149.0)
VLDL: 41 mg/dL — ABNORMAL HIGH (ref 0.0–40.0)

## 2022-12-16 LAB — TSH: TSH: 1.26 u[IU]/mL (ref 0.35–5.50)

## 2022-12-16 LAB — LDL CHOLESTEROL, DIRECT: Direct LDL: 125 mg/dL

## 2022-12-23 ENCOUNTER — Encounter: Payer: Medicare Other | Admitting: Family Medicine

## 2022-12-26 ENCOUNTER — Ambulatory Visit (INDEPENDENT_AMBULATORY_CARE_PROVIDER_SITE_OTHER): Payer: Medicare Other | Admitting: Family Medicine

## 2022-12-26 ENCOUNTER — Encounter: Payer: Self-pay | Admitting: Family Medicine

## 2022-12-26 VITALS — BP 110/64 | HR 76 | Temp 98.6°F | Wt 151.0 lb

## 2022-12-26 DIAGNOSIS — R739 Hyperglycemia, unspecified: Secondary | ICD-10-CM | POA: Diagnosis not present

## 2022-12-26 DIAGNOSIS — I10 Essential (primary) hypertension: Secondary | ICD-10-CM

## 2022-12-26 DIAGNOSIS — E785 Hyperlipidemia, unspecified: Secondary | ICD-10-CM | POA: Diagnosis not present

## 2022-12-26 DIAGNOSIS — M899 Disorder of bone, unspecified: Secondary | ICD-10-CM | POA: Diagnosis not present

## 2022-12-26 DIAGNOSIS — M159 Polyosteoarthritis, unspecified: Secondary | ICD-10-CM | POA: Diagnosis not present

## 2022-12-26 DIAGNOSIS — R200 Anesthesia of skin: Secondary | ICD-10-CM | POA: Diagnosis not present

## 2022-12-26 DIAGNOSIS — M150 Primary generalized (osteo)arthritis: Secondary | ICD-10-CM

## 2022-12-26 DIAGNOSIS — R202 Paresthesia of skin: Secondary | ICD-10-CM | POA: Diagnosis not present

## 2022-12-26 DIAGNOSIS — N3281 Overactive bladder: Secondary | ICD-10-CM

## 2022-12-26 DIAGNOSIS — K219 Gastro-esophageal reflux disease without esophagitis: Secondary | ICD-10-CM

## 2022-12-26 DIAGNOSIS — M949 Disorder of cartilage, unspecified: Secondary | ICD-10-CM

## 2022-12-26 DIAGNOSIS — K580 Irritable bowel syndrome with diarrhea: Secondary | ICD-10-CM | POA: Diagnosis not present

## 2022-12-26 MED ORDER — LOSARTAN POTASSIUM 100 MG PO TABS: 50.0000 mg | ORAL_TABLET | Freq: Every day | ORAL | Status: DC

## 2022-12-26 MED ORDER — ATORVASTATIN CALCIUM 10 MG PO TABS: 10.0000 mg | ORAL_TABLET | Freq: Every day | ORAL | 3 refills | Status: DC

## 2022-12-26 NOTE — Progress Notes (Signed)
Subjective:    Patient ID: [name], female    DOB: 03-07-1951, 72 y.o.   MRN: [id number]  HPI Here to follow up on issues. She feels well except for some intermittent tingling on the left side of her face that began about 2 months ago. No other neurologic deficits. Her recent labs results showed normal electrolytes. Her BP has been well controlled, but at her last nephrology visit they suggested she may be on too much BP medication. Her recent labs also showed a mild increase in the lipids, with the LDL up to 125 and the TG to 205. She says she eats a very healthy diet. Her IBS and OA have been stable. We had talked about her OAB at her last visit and we prescribed Vesicare. However she never took it, and she says she wants to avoid treating this as long as possible.    Review of Systems  Constitutional: Negative.   HENT: Negative.    Eyes: Negative.   Respiratory: Negative.    Cardiovascular: Negative.   Gastrointestinal: Negative.   Genitourinary:  Positive for frequency. Negative for decreased urine volume, difficulty urinating, dyspareunia, dysuria, enuresis, flank pain, hematuria, pelvic pain and urgency.  Musculoskeletal:  Positive for arthralgias.  Skin: Negative.   Neurological: Negative.  Negative for headaches.  Psychiatric/Behavioral: Negative.         Objective:   Physical Exam Constitutional:      General: She is not in acute distress.    Appearance: Normal appearance. She is well-developed.  HENT:     Head: Normocephalic and atraumatic.     Right Ear: External ear normal.     Left Ear: External ear normal.     Nose: Nose normal.     Mouth/Throat:     Pharynx: No oropharyngeal exudate.  Eyes:     General: No scleral icterus.    Conjunctiva/sclera: Conjunctivae normal.     Pupils: Pupils are equal, round, and reactive to light.  Neck:     Thyroid: No thyromegaly.     Vascular: No JVD.  Cardiovascular:     Rate and Rhythm: Normal rate and regular rhythm.      Pulses: Normal pulses.     Heart sounds: Normal heart sounds. No murmur heard.    No friction rub. No gallop.  Pulmonary:     Effort: Pulmonary effort is normal. No respiratory distress.     Breath sounds: Normal breath sounds. No wheezing or rales.  Chest:     Chest wall: No tenderness.  Abdominal:     General: Bowel sounds are normal. There is no distension.     Palpations: Abdomen is soft. There is no mass.     Tenderness: There is no abdominal tenderness. There is no guarding or rebound.  Musculoskeletal:        General: No tenderness. Normal range of motion.     Cervical back: Normal range of motion and neck supple.  Lymphadenopathy:     Cervical: No cervical adenopathy.  Skin:    General: Skin is warm and dry.     Findings: No erythema or rash.  Neurological:     General: No focal deficit present.     Mental Status: She is alert and oriented to person, place, and time.     Cranial Nerves: No cranial nerve deficit.     Motor: No abnormal muscle tone.     Coordination: Coordination normal.     Deep Tendon Reflexes: Reflexes are normal  and symmetric. Reflexes normal.  Psychiatric:        Mood and Affect: Mood normal.        Behavior: Behavior normal.        Thought Content: Thought content normal.        Judgment: Judgment normal.           Assessment & Plan:  Her HTN is well controlled, and we will decrease the Losartan to 1/2 a pill a day (50 mg). Her OAB is stable with no medication. For the dyslipidemia, she will start on Lipitor 10 mg daily. For the left facial tingling, we will set up carotid dopplers. Her IBS and OA are stable. We spent a total of (35   ) minutes reviewing records and discussing these issues.  Gershon Crane, MD  Gershon Crane, MD

## 2023-01-05 DIAGNOSIS — M25562 Pain in left knee: Secondary | ICD-10-CM | POA: Diagnosis not present

## 2023-01-05 DIAGNOSIS — M1711 Unilateral primary osteoarthritis, right knee: Secondary | ICD-10-CM | POA: Diagnosis not present

## 2023-01-05 DIAGNOSIS — M25561 Pain in right knee: Secondary | ICD-10-CM | POA: Diagnosis not present

## 2023-01-09 ENCOUNTER — Ambulatory Visit (HOSPITAL_COMMUNITY)
Admission: RE | Admit: 2023-01-09 | Discharge: 2023-01-09 | Disposition: A | Discharge status: Home / Self Care | Payer: Medicare Other | Source: Ambulatory Visit | Attending: Cardiovascular Disease | Admitting: Cardiovascular Disease

## 2023-01-09 DIAGNOSIS — R202 Paresthesia of skin: Secondary | ICD-10-CM | POA: Diagnosis not present

## 2023-01-09 DIAGNOSIS — R200 Anesthesia of skin: Secondary | ICD-10-CM | POA: Insufficient documentation

## 2023-01-11 ENCOUNTER — Ambulatory Visit (INDEPENDENT_AMBULATORY_CARE_PROVIDER_SITE_OTHER): Payer: Medicare Other | Admitting: Family Medicine

## 2023-01-11 ENCOUNTER — Encounter: Payer: Self-pay | Admitting: Family Medicine

## 2023-01-11 VITALS — BP 118/60 | HR 65 | Temp 98.0°F | Wt 148.8 lb

## 2023-01-11 DIAGNOSIS — I10 Essential (primary) hypertension: Secondary | ICD-10-CM

## 2023-01-11 MED ORDER — AMLODIPINE BESYLATE 5 MG PO TABS: 10.0000 mg | ORAL_TABLET | Freq: Every day | ORAL | Status: DC

## 2023-01-11 NOTE — Progress Notes (Signed)
   Subjective:    Patient ID: [name], female    DOB: 06-13-1950, 72 y.o.   MRN: [id number]  HPI Since our last visit she has decreased the Losartan to 1/2 a tablet daily (50 mg) at the advice of her nephrologist. Since then her BP has gone up, averaging in the 160's over 80's. She feels fine.    Review of Systems  Constitutional: Negative.   Respiratory: Negative.    Cardiovascular: Negative.        Objective:   Physical Exam Constitutional:      Appearance: Normal appearance.  Cardiovascular:     Rate and Rhythm: Normal rate and regular rhythm.     Pulses: Normal pulses.     Heart sounds: Normal heart sounds.  Pulmonary:     Effort: Pulmonary effort is normal.     Breath sounds: Normal breath sounds.  Neurological:     Mental Status: She is alert.           Assessment & Plan:  HTN, we will increase the Amlodipine to 10 mg daily. She will follow up with Nephrology next month.  Gershon Crane, MD

## 2023-01-19 ENCOUNTER — Other Ambulatory Visit: Payer: Self-pay | Admitting: Family Medicine

## 2023-01-23 DIAGNOSIS — N1831 Chronic kidney disease, stage 3a: Secondary | ICD-10-CM | POA: Diagnosis not present

## 2023-01-31 DIAGNOSIS — I129 Hypertensive chronic kidney disease with stage 1 through stage 4 chronic kidney disease, or unspecified chronic kidney disease: Secondary | ICD-10-CM | POA: Diagnosis not present

## 2023-01-31 DIAGNOSIS — N1831 Chronic kidney disease, stage 3a: Secondary | ICD-10-CM | POA: Diagnosis not present

## 2023-02-01 DIAGNOSIS — Z961 Presence of intraocular lens: Secondary | ICD-10-CM | POA: Diagnosis not present

## 2023-02-01 DIAGNOSIS — H5213 Myopia, bilateral: Secondary | ICD-10-CM | POA: Diagnosis not present

## 2023-02-01 DIAGNOSIS — H26491 Other secondary cataract, right eye: Secondary | ICD-10-CM | POA: Diagnosis not present

## 2023-02-10 ENCOUNTER — Ambulatory Visit (INDEPENDENT_AMBULATORY_CARE_PROVIDER_SITE_OTHER): Payer: Medicare Other

## 2023-02-10 DIAGNOSIS — Z23 Encounter for immunization: Secondary | ICD-10-CM | POA: Diagnosis not present

## 2023-04-03 DIAGNOSIS — J22 Unspecified acute lower respiratory infection: Secondary | ICD-10-CM | POA: Diagnosis not present

## 2023-04-05 ENCOUNTER — Ambulatory Visit: Payer: Medicare Other | Admitting: Family Medicine

## 2023-04-11 ENCOUNTER — Ambulatory Visit (INDEPENDENT_AMBULATORY_CARE_PROVIDER_SITE_OTHER): Payer: Medicare Other | Admitting: Family Medicine

## 2023-04-11 ENCOUNTER — Encounter: Payer: Self-pay | Admitting: Family Medicine

## 2023-04-11 VITALS — BP 100/60 | HR 61 | Temp 98.4°F | Wt 154.0 lb

## 2023-04-11 DIAGNOSIS — J40 Bronchitis, not specified as acute or chronic: Secondary | ICD-10-CM | POA: Diagnosis not present

## 2023-04-11 MED ORDER — METHYLPREDNISOLONE ACETATE 40 MG/ML IJ SUSP
40.0000 mg | Freq: Once | INTRAMUSCULAR | Status: AC
  Administered 2023-04-11: 40 mg via INTRAMUSCULAR

## 2023-04-11 MED ORDER — PROMETHAZINE-DM 6.25-15 MG/5ML PO SYRP: 5.0000 mL | ORAL_SOLUTION | Freq: Four times a day (QID) | ORAL | 0 refills | Status: DC | PRN

## 2023-04-11 MED ORDER — METHYLPREDNISOLONE ACETATE 80 MG/ML IJ SUSP
80.0000 mg | Freq: Once | INTRAMUSCULAR | Status: AC
  Administered 2023-04-11: 80 mg via INTRAMUSCULAR

## 2023-04-11 MED ORDER — DOXYCYCLINE HYCLATE 100 MG PO TABS: 100.0000 mg | ORAL_TABLET | Freq: Two times a day (BID) | ORAL | 0 refills | Status: DC

## 2023-04-11 NOTE — Addendum Note (Signed)
Addended by: Carola Rhine on: 04/11/2023 04:52 PM   Modules accepted: Orders

## 2023-04-11 NOTE — Progress Notes (Signed)
   Subjective:    Patient ID: [name], female    DOB: 07-13-1950, 72 y.o.   MRN: [id number]  HPI Here for 2 weeks of chest congestion and coughing up yellow sputum. No SOB or fever. While she was on a trip to [facility] she went to an urgent care on 04-03-23 for this. They gave her a Zpack, a Medrol dose pack ,and Phenergan DM. This helped her feel somewhat better but she is still coughing.    Review of Systems  Constitutional: Negative.   HENT: Negative.    Eyes: Negative.   Respiratory:  Positive for cough, chest tightness and shortness of breath. Negative for wheezing.        Objective:   Physical Exam Constitutional:      Appearance: Normal appearance. She is not ill-appearing.  HENT:     Right Ear: Tympanic membrane, ear canal and external ear normal.     Left Ear: Tympanic membrane, ear canal and external ear normal.     Nose: Nose normal.     Mouth/Throat:     Pharynx: Oropharynx is clear.  Eyes:     Conjunctiva/sclera: Conjunctivae normal.  Pulmonary:     Effort: Pulmonary effort is normal.     Breath sounds: Rhonchi present. No wheezing or rales.  Lymphadenopathy:     Cervical: No cervical adenopathy.  Neurological:     Mental Status: She is alert.           Assessment & Plan:  Partially treated bronchitis. We will give her a shot of DepoMedrol and 10 days of Doxycycline. Recheck as needed.  Gershon Crane, MD

## 2023-05-15 DIAGNOSIS — L720 Epidermal cyst: Secondary | ICD-10-CM | POA: Diagnosis not present

## 2023-05-15 DIAGNOSIS — L930 Discoid lupus erythematosus: Secondary | ICD-10-CM | POA: Diagnosis not present

## 2023-05-25 ENCOUNTER — Ambulatory Visit: Payer: Medicare Other | Admitting: Internal Medicine

## 2023-05-25 ENCOUNTER — Encounter: Payer: Self-pay | Admitting: Internal Medicine

## 2023-05-25 VITALS — BP 110/56 | HR 64 | Ht 62.0 in | Wt 158.0 lb

## 2023-05-25 DIAGNOSIS — K30 Functional dyspepsia: Secondary | ICD-10-CM | POA: Diagnosis not present

## 2023-05-25 DIAGNOSIS — K219 Gastro-esophageal reflux disease without esophagitis: Secondary | ICD-10-CM | POA: Diagnosis not present

## 2023-05-25 NOTE — Progress Notes (Signed)
[name] 72 y.o. 1950-11-20 [id number]  Assessment & Plan:   Encounter Diagnoses  Name Primary?   Functional dyspepsia Yes   Gastroesophageal reflux disease, unspecified whether esophagitis present     She will try to taper the PPI as I have written out in her AVS.  If she is unable to do this she can remain on the medication.  We could consider lower dose therapy perhaps 20 mg on a daily basis.    Subjective:   Chief Complaint: Heartburn/GERD, PPI therapy question  HPI 73 year old [place] woman with a long history of functional dyspepsia, prior duodenal adenoma, and fundic gland stomach polyps.  She has been on PPI therapy for many years more on than off.  Currently she is here to ask about coming off the medication.  When she tries to stop it on day 2 or 3 she will have severe heartburn symptomatology and restart with relief.  There is no dysphagia.  Currently she is grieving over the loss of a long-term pet, her dog.  She feels like she stopped being very active and ate a lot more.  Wt Readings from Last 3 Encounters:  05/25/23 158 lb (71.7 kg)  04/11/23 154 lb (69.9 kg)  01/11/23 148 lb 12.8 oz (67.5 kg)  EGD 08/30/2021 - A tattoo was seen in the duodenum. No signs of polyp(s) - Multiple gastric polyps. Known benign fundic gland polyps. Mucosa slightly erythematous in areas (prior biopsies normal) - The examination was otherwise normal. - No specimens collected. Allergies  Allergen Reactions   Keflex [Cephalexin] Anaphylaxis    "It made it hard to breathe".    Sulfamethoxazole-Trimethoprim Shortness Of Breath    flu like symptoms   Tramadol Other (See Comments)    Insomnia   Lansoprazole     REACTION: headaches   Levaquin [Levofloxacin In D5w] Nausea And Vomiting   Macrobid [Nitrofurantoin Macrocrystal] Other (See Comments)    Fever Chills and Body aches   Shellfish Allergy    Penicillins Rash    Has patient had a PCN reaction causing immediate rash,  facial/tongue/throat swelling, SOB or lightheadedness with hypotension: No Has patient had a PCN reaction causing severe rash involving mucus membranes or skin necrosis: No Has patient had a PCN reaction that required hospitalization No Has patient had a PCN reaction occurring within the last 10 years: No If all of the above answers are "NO", then may proceed with Cephalosporin use.    Current Meds  Medication Sig   amLODipine (NORVASC) 5 MG tablet Take 2 tablets (10 mg total) by mouth daily.   atorvastatin (LIPITOR) 10 MG tablet Take 1 tablet (10 mg total) by mouth daily.   b complex vitamins capsule Take 1 capsule by mouth daily.   Cholecalciferol (VITAMIN D) 50 MCG (2000 UT) CAPS Take 2,000 Units by mouth daily.   esomeprazole (NEXIUM) 40 MG capsule TAKE 1 CAPSULE BY MOUTH TWICE DAILY BEFORE A MEAL   estradiol (ESTRACE) 0.1 MG/GM vaginal cream Place 1 Applicatorful vaginally once a week.   KRILL OIL PO Take 1 tablet by mouth daily.   losartan (COZAAR) 100 MG tablet Take 0.5 tablets (50 mg total) by mouth daily.   metoprolol succinate (TOPROL-XL) 100 MG 24 hr tablet TAKE 1 TABLET BY MOUTH TWICE  DAILY WITH OR IMMEDIATELY  FOLLOWING MEAL   Probiotic Product (PROBIOTIC DAILY PO) Take 1 tablet by mouth daily.   valACYclovir (VALTREX) 500 MG tablet Take 500 mg by mouth 2 (two) times daily  as needed (fever blisters).   Past Medical History:  Diagnosis Date   Allergy    Anemia    Arthritis    Atherosclerosis of other specified arteries    Bilateral carpal tunnel syndrome    Cataract    GERD (gastroesophageal reflux disease)    Hemorrhoids    Hiatal hernia    Hypertension    IBS (irritable bowel syndrome)    Low back pain    Osteopenia    Renal disorder    UTI (lower urinary tract infection)    Venous insufficiency    Past Surgical History:  Procedure Laterality Date   ABDOMINAL HYSTERECTOMY     bilateral carpal tunnel release  01/2008 and 06/2005   Dr. Teressa Senter   BREAST REDUCTION  SURGERY  1999   COLONOSCOPY  12/29/2016   per Dr. Leone Payor, hemorrhoids only, no polyps, repeat in 10 yrs    ENDOVENOUS ABLATION SAPHENOUS VEIN W/ LASER  12-15-2011   right greater saphenous vein by Gretta Began MD   ENDOVENOUS ABLATION SAPHENOUS VEIN W/ LASER  01-26-2012   left greater saphenous vein by Gretta Began MD   ESOPHAGOGASTRODUODENOSCOPY  12/29/2016   per Dr. Leone Payor, gastritis    ESOPHAGOGASTRODUODENOSCOPY (EGD) WITH PROPOFOL N/A 08/10/2020   Procedure: ESOPHAGOGASTRODUODENOSCOPY (EGD) WITH PROPOFOL;  Surgeon: Iva Boop, MD;  Location: [facility];  Service: Endoscopy;  Laterality: N/A;   hemorrhoid sugery  04/2009   Dr. Derrell Lolling   left cataract and lens  07/2008   Dr. 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   Review of Systems  As above Objective:   Physical Exam BP (!) 110/56 (BP Location: Left Arm, Patient Position: Sitting, Cuff Size: Normal)   Pulse 64   Ht 5\' 2"  (1.575 m)   Wt 158 lb (71.7 kg)   SpO2 96%   BMI 28.90 kg/m

## 2023-05-25 NOTE — Patient Instructions (Signed)
Do the following to try to stop Nexium (esomeprazole):  1) Take Nexium every other day for 3 weeks  2) Then take Nexium every third day for 3 weeks  3) Then try to stop taking it at all  4) On days you get heartburn or feel like you need the Nexium but are not taking it you can try Pepcid (famotidine) 20 mg as needed 1 or 2 - this is over the counter  5) You can also take an antacid like Gaviscon or Mylanta for heartburn  6) Stay on a GERD diet (see pamphlet)  If you try this but think you can't make it without the Nexium just keep taking it.  I appreciate the opportunity to care for you. Iva Boop, MD, [facility]

## 2023-06-13 DIAGNOSIS — Z1231 Encounter for screening mammogram for malignant neoplasm of breast: Secondary | ICD-10-CM | POA: Diagnosis not present

## 2023-06-13 LAB — HM MAMMOGRAPHY

## 2023-06-14 ENCOUNTER — Encounter: Payer: Self-pay | Admitting: Family Medicine

## 2023-06-19 DIAGNOSIS — E859 Amyloidosis, unspecified: Secondary | ICD-10-CM | POA: Diagnosis not present

## 2023-07-05 ENCOUNTER — Encounter: Payer: Self-pay | Admitting: Family Medicine

## 2023-07-05 ENCOUNTER — Ambulatory Visit (INDEPENDENT_AMBULATORY_CARE_PROVIDER_SITE_OTHER): Admitting: Family Medicine

## 2023-07-05 VITALS — BP 110/62 | HR 76 | Temp 98.1°F | Wt 157.0 lb

## 2023-07-05 DIAGNOSIS — S39012A Strain of muscle, fascia and tendon of lower back, initial encounter: Secondary | ICD-10-CM

## 2023-07-05 DIAGNOSIS — J40 Bronchitis, not specified as acute or chronic: Secondary | ICD-10-CM

## 2023-07-05 DIAGNOSIS — R0989 Other specified symptoms and signs involving the circulatory and respiratory systems: Secondary | ICD-10-CM | POA: Diagnosis not present

## 2023-07-05 DIAGNOSIS — J029 Acute pharyngitis, unspecified: Secondary | ICD-10-CM

## 2023-07-05 LAB — POCT INFLUENZA A/B
Influenza A, POC: NEGATIVE
Influenza B, POC: NEGATIVE

## 2023-07-05 LAB — POC COVID19 BINAXNOW: SARS Coronavirus 2 Ag: NEGATIVE

## 2023-07-05 LAB — POCT RAPID STREP A (OFFICE): Rapid Strep A Screen: NEGATIVE

## 2023-07-05 MED ORDER — AZITHROMYCIN 250 MG PO TABS: ORAL_TABLET | ORAL | 0 refills | Status: DC

## 2023-07-05 MED ORDER — METHOCARBAMOL 500 MG PO TABS
500.0000 mg | ORAL_TABLET | Freq: Four times a day (QID) | ORAL | 1 refills | Status: AC | PRN
Start: 2023-07-05 — End: ?

## 2023-07-05 MED ORDER — DOXYCYCLINE HYCLATE 100 MG PO TABS: 100.0000 mg | ORAL_TABLET | Freq: Two times a day (BID) | ORAL | 0 refills | Status: DC

## 2023-07-05 NOTE — Addendum Note (Signed)
 Addended by: Carola Rhine on: 07/05/2023 10:47 AM   Modules accepted: Orders

## 2023-07-05 NOTE — Progress Notes (Signed)
   Subjective:    Patient ID: [name], female    DOB: 01/24/1951, 73 y.o.   MRN: [id number]  HPI Here for 2 issues. First about one week ago she was working in her garden and she felt a sudden sharp pain in the left lower back. She has had pain and tightness there since then. No pain in the legs. Using heat and Ibuprofen. The other issue is URI symptoms. About 6 days ago she developed a ST and chest congestion with a dry cough. No fever or SOB.    Review of Systems  Constitutional: Negative.   HENT:  Positive for congestion and sore throat. Negative for ear pain, postnasal drip and sinus pressure.   Eyes: Negative.   Respiratory:  Positive for cough. Negative for shortness of breath and wheezing.   Musculoskeletal:  Positive for back pain.       Objective:   Physical Exam Constitutional:      Comments: She is uncomfortable sitting in the chair  HENT:     Right Ear: Tympanic membrane, ear canal and external ear normal.     Left Ear: Tympanic membrane, ear canal and external ear normal.     Nose: Nose normal.     Mouth/Throat:     Pharynx: Oropharynx is clear.  Eyes:     Conjunctiva/sclera: Conjunctivae normal.  Pulmonary:     Breath sounds: Rhonchi present. No wheezing or rales.  Lymphadenopathy:     Cervical: No cervical adenopathy.  Neurological:     Mental Status: She is alert.           Assessment & Plan:  She has a low back strain, and she will use Methocarbamol QID as needed, along with heat and Ibuprofen. She also has a bronchitis, and we will treat this with 10 days of Doxycycline.  Gershon Crane, MD

## 2023-07-11 ENCOUNTER — Other Ambulatory Visit: Payer: Self-pay | Admitting: Family Medicine

## 2023-07-24 ENCOUNTER — Encounter: Payer: Self-pay | Admitting: Family Medicine

## 2023-07-24 ENCOUNTER — Ambulatory Visit

## 2023-07-24 ENCOUNTER — Ambulatory Visit (INDEPENDENT_AMBULATORY_CARE_PROVIDER_SITE_OTHER): Admitting: Family Medicine

## 2023-07-24 VITALS — BP 126/64 | HR 61 | Temp 98.1°F | Wt 157.8 lb

## 2023-07-24 DIAGNOSIS — R053 Chronic cough: Secondary | ICD-10-CM

## 2023-07-24 DIAGNOSIS — R059 Cough, unspecified: Secondary | ICD-10-CM | POA: Diagnosis not present

## 2023-07-24 DIAGNOSIS — R5383 Other fatigue: Secondary | ICD-10-CM | POA: Diagnosis not present

## 2023-07-24 LAB — CBC WITH DIFFERENTIAL/PLATELET
Basophils Absolute: 0 10*3/uL (ref 0.0–0.1)
Basophils Relative: 0.8 % (ref 0.0–3.0)
Eosinophils Absolute: 0.1 10*3/uL (ref 0.0–0.7)
Eosinophils Relative: 2.5 % (ref 0.0–5.0)
HCT: 40.1 % (ref 36.0–46.0)
Hemoglobin: 13.6 g/dL (ref 12.0–15.0)
Lymphocytes Relative: 38 % (ref 12.0–46.0)
Lymphs Abs: 1.2 10*3/uL (ref 0.7–4.0)
MCHC: 34 g/dL (ref 30.0–36.0)
MCV: 94.4 fl (ref 78.0–100.0)
Monocytes Absolute: 0.3 10*3/uL (ref 0.1–1.0)
Monocytes Relative: 9.8 % (ref 3.0–12.0)
Neutro Abs: 1.6 10*3/uL (ref 1.4–7.7)
Neutrophils Relative %: 48.9 % (ref 43.0–77.0)
Platelets: 199 10*3/uL (ref 150.0–400.0)
RBC: 4.25 Mil/uL (ref 3.87–5.11)
RDW: 12.9 % (ref 11.5–15.5)
WBC: 3.3 10*3/uL — ABNORMAL LOW (ref 4.0–10.5)

## 2023-07-24 LAB — HEPATIC FUNCTION PANEL
ALT: 16 U/L (ref 0–35)
AST: 22 U/L (ref 0–37)
Albumin: 4.6 g/dL (ref 3.5–5.2)
Alkaline Phosphatase: 45 U/L (ref 39–117)
Bilirubin, Direct: 0.2 mg/dL (ref 0.0–0.3)
Total Bilirubin: 1.2 mg/dL (ref 0.2–1.2)
Total Protein: 7.6 g/dL (ref 6.0–8.3)

## 2023-07-24 LAB — BASIC METABOLIC PANEL
BUN: 21 mg/dL (ref 6–23)
CO2: 29 meq/L (ref 19–32)
Calcium: 9.8 mg/dL (ref 8.4–10.5)
Chloride: 104 meq/L (ref 96–112)
Creatinine, Ser: 0.98 mg/dL (ref 0.40–1.20)
GFR: 57.99 mL/min — ABNORMAL LOW (ref >60.00)
Glucose, Bld: 105 mg/dL — ABNORMAL HIGH (ref 70–99)
Potassium: 4.3 meq/L (ref 3.5–5.1)
Sodium: 140 meq/L (ref 135–145)

## 2023-07-24 LAB — TSH: TSH: 1.18 u[IU]/mL (ref 0.35–5.50)

## 2023-07-24 NOTE — Progress Notes (Signed)
   Subjective:    Patient ID: [name], female    DOB: [birth year], 73 y.o.   MRN: [id number]  HPI Here for several issues. She has had an intermittent cough for the past 4 months. This is usually dry but sometimes it produces a yellow sputum. No chest pain or SOB. No fever. As noted in her chart, she was treated with a Zpack and Doxycycline in December and then she got Doxycycline again a few weeks ago. Each time she feels a little better for a week or so, but the cough returns. She is worried because her half sister (who was a smoker) had lung cancer. She also describes constant fatigue for several months. She is usually active playing tennis or golf, but she does not feel like it now.    Review of Systems  Constitutional:  Positive for fatigue. Negative for fever.  Respiratory:  Positive for cough. Negative for shortness of breath and wheezing.   Cardiovascular: Negative.   Gastrointestinal: Negative.   Genitourinary: Negative.        Objective:   Physical Exam Constitutional:      Appearance: Normal appearance.  Cardiovascular:     Rate and Rhythm: Normal rate and regular rhythm.     Pulses: Normal pulses.     Heart sounds: Normal heart sounds.  Pulmonary:     Effort: Pulmonary effort is normal.     Breath sounds: Normal breath sounds.  Abdominal:     General: Abdomen is flat. Bowel sounds are normal. There is no distension.     Palpations: Abdomen is soft. There is no mass.     Tenderness: There is no abdominal tenderness. There is no guarding or rebound.     Hernia: No hernia is present.  Neurological:     Mental Status: She is alert.           Assessment & Plan:  She has a chronic cough, so we will get a CXR today. Also refer to Pulmonology to evaluate (Dr. Delton Coombes). For the fatigue we will get labs to rule out anemia, thyroid problems, etc.  Gershon Crane, MD

## 2023-07-31 ENCOUNTER — Ambulatory Visit (INDEPENDENT_AMBULATORY_CARE_PROVIDER_SITE_OTHER): Admitting: Family Medicine

## 2023-07-31 ENCOUNTER — Encounter: Payer: Self-pay | Admitting: Family Medicine

## 2023-07-31 VITALS — BP 118/60 | HR 70 | Temp 98.2°F | Wt 157.0 lb

## 2023-07-31 DIAGNOSIS — R053 Chronic cough: Secondary | ICD-10-CM | POA: Diagnosis not present

## 2023-07-31 MED ORDER — PROMETHAZINE-DM 6.25-15 MG/5ML PO SYRP
5.0000 mL | ORAL_SOLUTION | Freq: Four times a day (QID) | ORAL | 0 refills | Status: AC | PRN
Start: 2023-07-31 — End: ?

## 2023-07-31 MED ORDER — METHYLPREDNISOLONE 4 MG PO TBPK: ORAL_TABLET | ORAL | 0 refills | Status: DC

## 2023-07-31 NOTE — Progress Notes (Signed)
   Subjective:    Patient ID: [name], female    DOB: 11/21/1950, 73 y.o.   MRN: [id number]  HPI Here for a continuing cough. She had a CXR here on 07-24-23 but we are still waiting for a report on this. She still has a dry cough, but no fever or SOB. Promethazine DM helps to calm this down a little.    Review of Systems  Constitutional: Negative.   HENT: Negative.    Eyes: Negative.   Respiratory:  Positive for cough. Negative for shortness of breath and wheezing.   Cardiovascular: Negative.        Objective:   Physical Exam Constitutional:      Appearance: Normal appearance. She is not ill-appearing.  Cardiovascular:     Rate and Rhythm: Normal rate and regular rhythm.     Pulses: Normal pulses.     Heart sounds: Normal heart sounds.  Pulmonary:     Effort: Pulmonary effort is normal.     Breath sounds: Normal breath sounds.  Neurological:     Mental Status: She is alert.           Assessment & Plan:  Chronic cough. We will given her a Medrol dose pack and will refill the Promethazine DM. We await the CXR reading. She is scheduled to see Pulmonology on 09-08-23.  Gershon Crane, MD

## 2023-08-17 ENCOUNTER — Other Ambulatory Visit: Payer: Self-pay | Admitting: Internal Medicine

## 2023-09-08 ENCOUNTER — Ambulatory Visit: Admitting: Emergency Medicine

## 2023-09-22 ENCOUNTER — Other Ambulatory Visit: Payer: Self-pay | Admitting: Family Medicine

## 2023-09-22 ENCOUNTER — Telehealth: Payer: Self-pay | Admitting: Family Medicine

## 2023-09-22 MED ORDER — AZITHROMYCIN 250 MG PO TABS: ORAL_TABLET | ORAL | 0 refills | Status: DC

## 2023-09-22 NOTE — Telephone Encounter (Signed)
 She asks for a Zpack to keep in case she gets sick while traveling

## 2023-09-27 ENCOUNTER — Encounter: Payer: Self-pay | Admitting: Family Medicine

## 2023-09-27 ENCOUNTER — Ambulatory Visit (INDEPENDENT_AMBULATORY_CARE_PROVIDER_SITE_OTHER): Admitting: Family Medicine

## 2023-09-27 ENCOUNTER — Other Ambulatory Visit: Payer: Self-pay

## 2023-09-27 ENCOUNTER — Other Ambulatory Visit: Payer: Self-pay | Admitting: Family Medicine

## 2023-09-27 VITALS — BP 120/60 | HR 65 | Temp 97.6°F | Wt 158.0 lb

## 2023-09-27 DIAGNOSIS — M791 Myalgia, unspecified site: Secondary | ICD-10-CM

## 2023-09-27 DIAGNOSIS — E785 Hyperlipidemia, unspecified: Secondary | ICD-10-CM | POA: Diagnosis not present

## 2023-09-27 DIAGNOSIS — R5383 Other fatigue: Secondary | ICD-10-CM

## 2023-09-27 NOTE — Progress Notes (Signed)
   Subjective:    Patient ID: [name], female    DOB: Oct 23, 1950, 73 y.o.   MRN: [id number]  HPI Here for what she thinks are side effects of her Lipitor. She started taking this 8 months ago. About 2 months ago she began to feel very fatigued, and she had trouble doing her daily activities. Then one month ago she developed achy pains in all her joints. No swelling. She thought these may be coming from the Lipitor, so she stopped it for 3 days. During these 3 days she says she began to feel better woth less pain and more energy. Now she asks what to do. Her last lipid panel in August 2024 showed an LDL of 125 and TG of 2.5.    Review of Systems  Constitutional:  Positive for fatigue.  Respiratory: Negative.    Cardiovascular: Negative.   Musculoskeletal:  Positive for arthralgias.       Objective:   Physical Exam Constitutional:      Appearance: Normal appearance.  Cardiovascular:     Rate and Rhythm: Normal rate and regular rhythm.     Pulses: Normal pulses.     Heart sounds: Normal heart sounds.  Pulmonary:     Effort: Pulmonary effort is normal.     Breath sounds: Normal breath sounds.  Neurological:     Mental Status: She is alert.           Assessment & Plan:  Myalgias and fatigue, which are likely side effects of the Lipitor. She will stop taking this, and she will see us  in one month for an exam and lab work.  Corita Diego, MD

## 2023-10-06 ENCOUNTER — Other Ambulatory Visit: Payer: Self-pay | Admitting: Family Medicine

## 2023-10-11 ENCOUNTER — Ambulatory Visit: Admitting: Emergency Medicine

## 2023-10-12 DIAGNOSIS — M1711 Unilateral primary osteoarthritis, right knee: Secondary | ICD-10-CM | POA: Diagnosis not present

## 2023-10-30 ENCOUNTER — Ambulatory Visit: Admitting: Family Medicine

## 2023-10-30 DIAGNOSIS — E859 Amyloidosis, unspecified: Secondary | ICD-10-CM | POA: Diagnosis not present

## 2023-11-21 NOTE — Progress Notes (Signed)
   11/21/2023  Patient ID: [name], female   DOB: 10/13/1950, 73 y.o.   MRN: [id number]  Pharmacy Quality Measure Review  This patient is appearing on a report for being at risk of failing the adherence measure for cholesterol (statin) medications this calendar year.   Medication: Atorvastatin  10mg  Last fill date: 11/15/23 for 90 day supply  Insurance report was not up to date. No action needed at this time.   Jon VEAR Lindau, PharmD Clinical Pharmacist [phone]

## 2023-11-23 ENCOUNTER — Telehealth: Payer: Self-pay | Admitting: *Deleted

## 2023-11-23 NOTE — Telephone Encounter (Signed)
 Copied from CRM [id number]. Topic: Clinical - Medication Question >> Nov 22, 2023 11:38 AM Burnard DEL wrote: Reason for CRM: Patient was advised to take 50 mg of losartan  medication at her last OV with provider instead of 100 mg. She would like to know if new prescription with new dosage could be sent to pharmacy to reflect this change?  [id number] [place], [place] - [address] AT [facility] OF [address] & [facility] [address]  Phone: [phone] Fax: [phone]

## 2023-11-27 MED ORDER — LOSARTAN POTASSIUM 50 MG PO TABS: 50.0000 mg | ORAL_TABLET | Freq: Every day | ORAL | 3 refills | Status: AC

## 2023-11-27 NOTE — Telephone Encounter (Signed)
 Done

## 2023-11-27 NOTE — Addendum Note (Signed)
 Addended by: JOHNNY SENIOR A on: 11/27/2023 12:35 PM   Modules accepted: Orders

## 2023-11-29 ENCOUNTER — Ambulatory Visit (INDEPENDENT_AMBULATORY_CARE_PROVIDER_SITE_OTHER): Admitting: Family Medicine

## 2023-11-29 ENCOUNTER — Encounter: Payer: Self-pay | Admitting: Family Medicine

## 2023-11-29 VITALS — BP 110/60 | HR 80 | Temp 97.9°F | Wt 155.4 lb

## 2023-11-29 DIAGNOSIS — R739 Hyperglycemia, unspecified: Secondary | ICD-10-CM

## 2023-11-29 DIAGNOSIS — I10 Essential (primary) hypertension: Secondary | ICD-10-CM | POA: Diagnosis not present

## 2023-11-29 DIAGNOSIS — E785 Hyperlipidemia, unspecified: Secondary | ICD-10-CM | POA: Diagnosis not present

## 2023-11-29 LAB — LIPID PANEL
Cholesterol: 172 mg/dL (ref 0–200)
HDL: 37.9 mg/dL — ABNORMAL LOW (ref >39.00)
LDL Cholesterol: 99 mg/dL (ref 0–99)
NonHDL: 133.98
Total CHOL/HDL Ratio: 5
Triglycerides: 174 mg/dL — ABNORMAL HIGH (ref 0.0–149.0)
VLDL: 34.8 mg/dL (ref 0.0–40.0)

## 2023-11-29 LAB — HEMOGLOBIN A1C: Hgb A1c MFr Bld: 6.1 % (ref 4.6–6.5)

## 2023-11-29 NOTE — Progress Notes (Signed)
   Subjective:    Patient ID: [name], female    DOB: May 09, 1950, 73 y.o.   MRN: [id number]  HPI Here to follow up on some issues. Her BP has been stable. At our last visit a month ago she described diffuse muscle aches and fatigue, which she felt may have been side effects of Lipitor. She stopped this, and she has now been off this for almost 5 weeks. She feels better with less aches and more energy. She is here to check her lipid levels.    Review of Systems  Constitutional: Negative.   Respiratory: Negative.    Cardiovascular: Negative.   Musculoskeletal: Negative.        Objective:   Physical Exam Constitutional:      Appearance: Normal appearance.  Cardiovascular:     Rate and Rhythm: Normal rate and regular rhythm.     Pulses: Normal pulses.     Heart sounds: Normal heart sounds.  Pulmonary:     Effort: Pulmonary effort is normal.     Breath sounds: Normal breath sounds.  Neurological:     Mental Status: She is alert.           Assessment & Plan:  Her HTN is stable. The myalgias and fatigue seem to have been side effects of Lipitor, so we will stay off this. Check lipids today. Garnette Olmsted, MD

## 2023-11-30 ENCOUNTER — Ambulatory Visit: Payer: Self-pay | Admitting: Family Medicine

## 2023-12-06 ENCOUNTER — Encounter: Payer: Self-pay | Admitting: Emergency Medicine

## 2023-12-06 ENCOUNTER — Ambulatory Visit (INDEPENDENT_AMBULATORY_CARE_PROVIDER_SITE_OTHER)

## 2023-12-06 ENCOUNTER — Ambulatory Visit: Admitting: Emergency Medicine

## 2023-12-06 VITALS — BP 138/76 | HR 63 | Temp 97.8°F | Ht 62.0 in | Wt 156.2 lb

## 2023-12-06 DIAGNOSIS — R053 Chronic cough: Secondary | ICD-10-CM

## 2023-12-06 NOTE — Patient Instructions (Addendum)
 VISIT SUMMARY:  During your visit, we discussed your chronic cough and recurrent respiratory infections, which have been ongoing since November 2024. We reviewed your symptoms, medical history, and current medications. We also discussed potential causes and planned further tests to better understand your condition.  YOUR PLAN:  -RECURRENT UPPER RESPIRATORY TRACT INFECTIONS WITH CHRONIC COUGH: You have been experiencing frequent respiratory infections and a persistent cough since November 2024. This could be due to irritable upper airway syndrome or possibly asthma. We will conduct pulmonary function tests to check for asthma or airway reactivity, and you will start taking loratadine to help with upper airway irritation. We will also repeat your chest x-ray to ensure there are no new lung issues.  -ALLERGIC RHINITIS: Allergic rhinitis is an inflammation of the nasal passages caused by allergies, which can contribute to your chronic cough through post-nasal drip.  Please start taking loratadine 10 mg once daily until next visit (generic Claritin, over-the-counter) to help manage these symptoms.  -GASTROESOPHAGEAL REFLUX DISEASE (GERD): GERD is a condition where stomach acid frequently flows back into the esophagus, causing irritation. Your GERD is well-controlled with Nexium , and you should continue taking it twice daily as prescribed.  INSTRUCTIONS:  Please follow up with the pulmonary function tests and repeat chest x-ray as ordered. Continue taking Nexium  40 mg twice daily for GERD and start taking loratadine 10 mg once daily until next visit. If you have any new or worsening symptoms, please contact our office.  Please follow-up with Dr. Shelah or APP in our office in about 2 months to review your PFT and your status.

## 2023-12-06 NOTE — Progress Notes (Signed)
 Subjective:    Patient ID: [name], female    DOB: 08-06-1950, 73 y.o.   MRN: [id number]  HPI   History of Present Illness   [name] is a 73 year old female with chronic cough and upper airway cough syndrome who presents with recurrent respiratory infections. She was referred by Dr. Johnny for evaluation of her recurrent respiratory infections and chronic cough.  Since November 2024, she has experienced recurrent episodes of respiratory infections, beginning with a severe illness in [facility], characterized by cough, fever, chest tightness, and phlegm. Initial treatment at an urgent care in [facility] included antibiotics and cough medicine, but she did not fully recover and required further treatment from Dr. Johnny, including additional antibiotics and cough medicine. Despite treatment, she continued to experience recurrent colds approximately five times since the initial episode, with symptoms often starting with a sore throat and not fully resolving between episodes.  She has a persistent dry cough occurring daily, particularly in the mornings when brushing her teeth, sometimes producing yellow mucus. She blows her nose daily, producing clear mucus. No nighttime coughing or sleep disturbances due to coughing.  Her past medical history includes GERD, allergic rhinitis, hiatal hernia, hypertension, and IBS. She takes Nexium  40 mg twice daily for GERD and losartan  for hypertension. Her heartburn is well controlled with Nexium , experiencing symptoms only when she misses a dose.  She has a history of chronic cough and was previously seen for upper airway cough syndrome in 2019. She has not been diagnosed with asthma and has never used an inhaler. No exposure to tuberculosis and no history of lung cancer, although her father died of a respiratory condition.  In 2022, she contracted COVID-19 while in [place] and experienced a prolonged illness. She also recalls a similar prolonged illness following a  mission trip to [place] in 2000.  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         Results   RADIOLOGY Chest x-ray: Clear (07/24/2023)       Review of Systems As per HPI  Past Medical History:  Diagnosis Date   Allergy    Anemia    Arthritis    Atherosclerosis of other specified arteries    Bilateral carpal tunnel syndrome    Cataract    GERD (gastroesophageal reflux disease)    Hemorrhoids    Hiatal hernia    Hypertension    IBS (irritable bowel syndrome)    Low back pain    Osteopenia    Renal disorder    UTI (lower urinary tract infection)    Venous insufficiency     Family History  Problem Relation Age of Onset   Stroke Mother    COPD Father    Lung cancer Sister        smoked   Colon cancer Neg Hx    Esophageal cancer Neg Hx    Pancreatic cancer Neg Hx    Rectal cancer Neg Hx    Stomach cancer Neg Hx      Social History   Socioeconomic History   Marital status: Married    Spouse name: Not on file   Number of children: 2   Years of education: Not on file   Highest education level: Not on file  Occupational History   Occupation: retired  Tobacco Use   Smoking  status: Never   Smokeless tobacco: Never  Vaping Use   Vaping status: Never Used  Substance and Sexual Activity   Alcohol use: No   Drug use: No   Sexual activity: Not Currently  Other Topics Concern   Not on file  Social History Narrative   Married, originally from [place]   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   1 son one daughter both adults daughter in [place] area son in [place], [place]   No EtOH/tobacco/drugs   Social Drivers of Corporate investment banker Strain: Not on [employer] Insecurity: Not on file  Transportation Needs: Not on file  Physical Activity: Not on file  Stress: Not on file  Social Connections: Not on file  Intimate  Partner Violence: Not on file   - Tobacco: Never smoker - Employment: Tax adviser (Until 2018), Market researcher - Living Situation: Has lived in [place]  for over fifty years  Allergies  Allergen Reactions   Keflex  [Cephalexin ] Anaphylaxis    It made it hard to breathe.    Sulfamethoxazole-Trimethoprim Shortness Of Breath    flu like symptoms   Tramadol  Other (See Comments)    Insomnia   Lansoprazole     REACTION: headaches   Levaquin  [Levofloxacin  In D5w] Nausea And Vomiting   Macrobid  [Nitrofurantoin  Macrocrystal] Other (See Comments)    Fever Chills and Body aches   Shellfish Allergy    Penicillins Rash    Has patient had a PCN reaction causing immediate rash, facial/tongue/throat swelling, SOB or lightheadedness with hypotension: No Has patient had a PCN reaction causing severe rash involving mucus membranes or skin necrosis: No Has patient had a PCN reaction that required hospitalization No Has patient had a PCN reaction occurring within the last 10 years: No If all of the above answers are NO, then may proceed with Cephalosporin use.     Current Outpatient Medications on File Prior to Visit  Medication Sig Dispense Refill   amLODipine  (NORVASC ) 5 MG tablet TAKE 1 TABLET(5 MG) BY MOUTH DAILY 30 tablet 0   b complex vitamins capsule Take 1 capsule by mouth daily.     Cholecalciferol (VITAMIN D ) 50 MCG (2000 UT) CAPS Take 2,000 Units by mouth daily.     esomeprazole  (NEXIUM ) 40 MG capsule TAKE 1 CAPSULE BY MOUTH TWICE DAILY BEFORE A MEAL 180 capsule 0   estradiol (ESTRACE) 0.1 MG/GM vaginal cream Place 1 Applicatorful vaginally once a week.     KRILL OIL PO Take 1 tablet by mouth daily.     losartan  (COZAAR ) 50 MG tablet Take 1 tablet (50 mg total) by mouth daily. 90 tablet 3   metoprolol  succinate (TOPROL -XL) 100 MG 24 hr tablet TAKE 1 TABLET BY MOUTH TWICE  DAILY WITH OR IMMEDIATELY  FOLLOWING MEAL 200 tablet 2   Probiotic Product  (PROBIOTIC DAILY PO) Take 1 tablet by mouth daily.     promethazine -dextromethorphan (PROMETHAZINE -DM) 6.25-15 MG/5ML syrup Take 5 mLs by mouth 4 (four) times daily as needed for cough. 240 mL 0   methocarbamol  (ROBAXIN ) 500 MG tablet Take 1 tablet (500 mg total) by mouth every 6 (six) hours as needed for muscle spasms. (Patient not taking: Reported on 12/06/2023) 60 tablet 1   valACYclovir (VALTREX) 500 MG tablet Take 500 mg by mouth 2 (two) times daily as needed (fever blisters). (Patient not taking: Reported on 12/06/2023)  3   No current facility-administered medications on file prior to visit.  Objective:    Vitals:   12/06/23 0823  BP: 138/76  Pulse: 63  Temp: 97.8 F (36.6 C)  SpO2: 96%  Weight: 156 lb 3.2 oz (70.9 kg)  Height: 5' 2 (1.575 m)   Physical Exam Gen: Pleasant, well-nourished, in no distress,  normal affect  ENT: No lesions,  mouth clear,  oropharynx clear, no postnasal drip  Neck: No JVD, no stridor  Lungs: No use of accessory muscles, no crackles or wheezing on normal respiration, no wheeze on forced expiration  Cardiovascular: RRR, heart sounds normal, no murmur or gallops, no peripheral edema  Musculoskeletal: No deformities, no cyanosis or clubbing  Neuro: alert, awake, non focal  Skin: Warm, no lesions or rashes      Assessment & Plan:   Assessment & Plan Chronic cough  Assessment and Plan    Recurrent upper respiratory tract infections with chronic cough Chronic cough with recurrent infections since November 2024. Symptoms include cough, fever, chest tightness, and phlegm. Episodes often triggered by viral infections. Differential includes irritable upper airway syndrome and possible asthma. Previous chest x-ray clear. No asthma diagnosis or inhaler use. Possible losartan  contribution to cough, though less likely. No significant chemical exposure or tuberculosis. - Order pulmonary function tests to assess for asthma or airway reactivity. -  Prescribe loratadine for potential upper airway irritation. - Repeat chest x-ray to rule out new pulmonary issues.  Allergic rhinitis Possible contribution to chronic cough via post-nasal drip. Symptoms include daily nasal congestion and clear discharge. - Prescribe loratadine to manage symptoms.  Gastroesophageal reflux disease (GERD) GERD well-controlled with Nexium  40 mg twice daily. Symptoms managed effectively with regular use. - Continue Nexium  40 mg twice daily.        Return in about 1 month (around 01/06/2024) for with Advanced Practitioner.   Lamar Chris, MD, PhD 12/06/2023, 9:08 AM [facility] [phone] or if no answer before 7:00PM call [phone] For any issues after 7:00PM please call eLink [phone]

## 2023-12-06 NOTE — Progress Notes (Deleted)
 Subjective:    Patient ID: Samantha Ragen, female    DOB: February 19, 1951, 73 y.o.   MRN: [id number]  HPI Discussed the use of AI scribe software for clinical note transcription with the patient, who gave verbal consent to proceed.  History of Present Illness     Results     Review of Systems As per HPI  Past Medical History:  Diagnosis Date   Allergy    Anemia    Arthritis    Atherosclerosis of other specified arteries    Bilateral carpal tunnel syndrome    Cataract    GERD (gastroesophageal reflux disease)    Hemorrhoids    Hiatal hernia    Hypertension    IBS (irritable bowel syndrome)    Low back pain    Osteopenia    Renal disorder    UTI (lower urinary tract infection)    Venous insufficiency     Family History  Problem Relation Age of Onset   Stroke Mother    COPD Father    Lung cancer Sister        smoked   Colon cancer Neg Hx    Esophageal cancer Neg Hx    Pancreatic cancer Neg Hx    Rectal cancer Neg Hx    Stomach cancer Neg Hx      Social History   Socioeconomic History   Marital status: Married    Spouse name: Not on file   Number of children: 2   Years of education: Not on file   Highest education level: Not on file  Occupational History   Occupation: retired  Tobacco Use   Smoking status: Never   Smokeless tobacco: Never  Vaping Use   Vaping status: Never Used  Substance and Sexual Activity   Alcohol use: No   Drug use: No   Sexual activity: Not Currently  Other Topics Concern   Not on file  Social History Narrative   Married, originally from [place]   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    Sulfamethoxazole-Trimethoprim Shortness Of Breath    flu like symptoms   Tramadol  Other (See Comments)    Insomnia   Lansoprazole     REACTION: headaches   Levaquin  [Levofloxacin  In D5w] Nausea And Vomiting   Macrobid  [Nitrofurantoin  Macrocrystal] Other (See Comments)    Fever Chills and Body aches   Shellfish Allergy    Penicillins Rash    Has patient had a PCN reaction causing immediate rash, facial/tongue/throat swelling, SOB or lightheadedness with hypotension: No Has patient had a PCN reaction causing severe rash involving mucus membranes or skin necrosis: No Has patient had a PCN reaction that required hospitalization No Has patient had a PCN reaction occurring within the last 10 years: No If all of the above answers are NO, then may proceed with Cephalosporin use.     Current Outpatient Medications on File Prior to Visit  Medication Sig Dispense Refill  amLODipine  (NORVASC ) 5 MG tablet TAKE 1 TABLET(5 MG) BY MOUTH DAILY 30 tablet 0   b complex vitamins capsule Take 1 capsule by mouth daily.     Cholecalciferol (VITAMIN D ) 50 MCG (2000 UT) CAPS Take 2,000 Units by mouth daily.     esomeprazole  (NEXIUM ) 40 MG capsule TAKE 1 CAPSULE BY MOUTH TWICE DAILY BEFORE A MEAL 180 capsule 0   estradiol (ESTRACE) 0.1 MG/GM vaginal cream Place 1 Applicatorful vaginally once a week.     KRILL OIL PO Take 1 tablet by mouth daily.     losartan  (COZAAR ) 50 MG tablet Take 1 tablet (50 mg total) by mouth daily. 90 tablet 3   metoprolol  succinate (TOPROL -XL) 100 MG 24 hr tablet TAKE 1 TABLET BY MOUTH TWICE  DAILY WITH OR IMMEDIATELY  FOLLOWING MEAL 200 tablet 2   Probiotic Product (PROBIOTIC DAILY PO) Take 1 tablet by mouth daily.     promethazine -dextromethorphan (PROMETHAZINE -DM) 6.25-15 MG/5ML syrup Take 5 mLs by mouth 4 (four) times daily as needed for  cough. 240 mL 0   methocarbamol  (ROBAXIN ) 500 MG tablet Take 1 tablet (500 mg total) by mouth every 6 (six) hours as needed for muscle spasms. (Patient not taking: Reported on 12/06/2023) 60 tablet 1   valACYclovir (VALTREX) 500 MG tablet Take 500 mg by mouth 2 (two) times daily as needed (fever blisters). (Patient not taking: Reported on 12/06/2023)  3   No current facility-administered medications on file prior to visit.       Objective:    Vitals:   12/06/23 0823  BP: 138/76  Pulse: 63  Temp: 97.8 F (36.6 C)  SpO2: 96%  Weight: 156 lb 3.2 oz (70.9 kg)  Height: 5' 2 (1.575 m)   Physical Exam Gen: Pleasant, well-nourished, in no distress,  normal affect  ENT: No lesions,  mouth clear,  oropharynx clear, no postnasal drip  Neck: No JVD, no stridor  Lungs: No use of accessory muscles, no crackles or wheezing on normal respiration, no wheeze on forced expiration  Cardiovascular: RRR, heart sounds normal, no murmur or gallops, no peripheral edema  Musculoskeletal: No deformities, no cyanosis or clubbing  Neuro: alert, awake, non focal  Skin: Warm, no lesions or rashes       Assessment & Plan:   Assessment & Plan   Assessment and Plan Assessment & Plan      No follow-ups on file.

## 2023-12-25 DIAGNOSIS — R21 Rash and other nonspecific skin eruption: Secondary | ICD-10-CM | POA: Diagnosis not present

## 2023-12-28 ENCOUNTER — Telehealth: Payer: Self-pay | Admitting: Family Medicine

## 2023-12-28 NOTE — Telephone Encounter (Signed)
 Patient came in stating she accidentally dropped her Losartan  in the sink and they got wet, she want to know if she can still take them.  Please advise at [phone]

## 2024-01-02 NOTE — Telephone Encounter (Signed)
 Spoke with pt advised to use [facility] to find a cheaper out of pocket  price for Losartan , pt voiced understanding

## 2024-01-02 NOTE — Telephone Encounter (Signed)
 I suggest she replace the pills with new ones. If insurance will not cover this, she can pay with cash

## 2024-01-04 DIAGNOSIS — M1711 Unilateral primary osteoarthritis, right knee: Secondary | ICD-10-CM | POA: Diagnosis not present

## 2024-01-11 DIAGNOSIS — M1711 Unilateral primary osteoarthritis, right knee: Secondary | ICD-10-CM | POA: Diagnosis not present

## 2024-01-18 DIAGNOSIS — M1711 Unilateral primary osteoarthritis, right knee: Secondary | ICD-10-CM | POA: Diagnosis not present

## 2024-01-24 ENCOUNTER — Ambulatory Visit (INDEPENDENT_AMBULATORY_CARE_PROVIDER_SITE_OTHER): Admitting: Emergency Medicine

## 2024-01-24 DIAGNOSIS — R053 Chronic cough: Secondary | ICD-10-CM

## 2024-01-24 LAB — PULMONARY FUNCTION TEST
DL/VA % pred: 109 %
DL/VA: 4.58 ml/min/mmHg/L
DLCO cor % pred: 94 %
DLCO cor: 17.06 ml/min/mmHg
DLCO unc % pred: 94 %
DLCO unc: 17.06 ml/min/mmHg
FEF 25-75 Post: 2.18 L/s
FEF 25-75 Pre: 1.68 L/s
FEF2575-%Change-Post: 29 %
FEF2575-%Pred-Post: 129 %
FEF2575-%Pred-Pre: 99 %
FEV1-%Change-Post: 5 %
FEV1-%Pred-Post: 99 %
FEV1-%Pred-Pre: 94 %
FEV1-Post: 2 L
FEV1-Pre: 1.9 L
FEV1FVC-%Change-Post: 0 %
FEV1FVC-%Pred-Pre: 104 %
FEV6-%Change-Post: 5 %
FEV6-%Pred-Post: 98 %
FEV6-%Pred-Pre: 94 %
FEV6-Post: 2.53 L
FEV6-Pre: 2.41 L
FEV6FVC-%Change-Post: 0 %
FEV6FVC-%Pred-Post: 104 %
FEV6FVC-%Pred-Pre: 104 %
FVC-%Change-Post: 5 %
FVC-%Pred-Post: 94 %
FVC-%Pred-Pre: 89 %
FVC-Post: 2.54 L
FVC-Pre: 2.41 L
Post FEV1/FVC ratio: 79 %
Post FEV6/FVC ratio: 99 %
Pre FEV1/FVC ratio: 79 %
Pre FEV6/FVC Ratio: 100 %
RV % pred: 115 %
RV: 2.46 L
TLC % pred: 104 %
TLC: 4.98 L

## 2024-01-24 NOTE — Progress Notes (Signed)
 Full PFT performed today.

## 2024-01-24 NOTE — Patient Instructions (Signed)
 Full PFT performed today.

## 2024-01-25 DIAGNOSIS — M25561 Pain in right knee: Secondary | ICD-10-CM | POA: Diagnosis not present

## 2024-01-29 ENCOUNTER — Ambulatory Visit: Admitting: Adult Health

## 2024-01-31 ENCOUNTER — Other Ambulatory Visit: Payer: Self-pay | Admitting: Family Medicine

## 2024-02-07 DIAGNOSIS — H5213 Myopia, bilateral: Secondary | ICD-10-CM | POA: Diagnosis not present

## 2024-02-07 DIAGNOSIS — Z961 Presence of intraocular lens: Secondary | ICD-10-CM | POA: Diagnosis not present

## 2024-02-13 ENCOUNTER — Other Ambulatory Visit: Payer: Self-pay | Admitting: Family Medicine

## 2024-02-16 ENCOUNTER — Encounter: Payer: Self-pay | Admitting: Emergency Medicine

## 2024-02-16 ENCOUNTER — Ambulatory Visit: Admitting: Emergency Medicine

## 2024-02-16 VITALS — BP 134/68 | HR 75 | Temp 97.9°F | Wt 153.4 lb

## 2024-02-16 DIAGNOSIS — R053 Chronic cough: Secondary | ICD-10-CM

## 2024-02-16 MED ORDER — BENZONATATE 100 MG PO CAPS: 100.0000 mg | ORAL_CAPSULE | Freq: Four times a day (QID) | ORAL | 1 refills | Status: AC | PRN

## 2024-02-16 NOTE — Progress Notes (Signed)
   Subjective:    Patient ID: [name], female    DOB: 09/12/1950, 73 y.o.   MRN: [id number]  HPI  ROV 02/16/2024 --73 year old woman whom I saw in August for evaluation of chronic cough that have been bothersome since 03/2023.  Question whether triggered by a series of viral URI.  She had well-controlled GERD, some PND and loratadine was considered, she did not start.  Pulmonary function testing was done and she is here to review.  She has been well - not coughing significantly. Last time she took cough meds was 2 weeks ago, again when she had a cold. No SOB.   Pulmonary function testing 12/06/2023 reviewed by me shows normal airflows without a bronchodilator response mild curve to the flow-volume loop suggestive of possible mild obstruction.  The lung volumes are normal and diffusion capacity is normal.  Review of Systems As per HPI     Objective:    Vitals:   02/16/24 0851  BP: 134/68  Pulse: 75  Temp: 97.9 F (36.6 C)  SpO2: 99%  Weight: 153 lb 6.4 oz (69.6 kg)     Physical Exam Gen: Pleasant, well-nourished, in no distress,  normal affect  ENT: No lesions,  mouth clear,  oropharynx clear, no postnasal drip  Neck: No JVD, no stridor  Lungs: No use of accessory muscles, no crackles or wheezing on normal respiration, no wheeze on forced expiration  Cardiovascular: RRR, heart sounds normal, no murmur or gallops, no peripheral edema  Musculoskeletal: No deformities, no cyanosis or clubbing  Neuro: alert, awake, non focal  Skin: Warm, no lesions or rashes      Assessment & Plan:   Assessment & Plan Chronic cough    Chronic cough Reviewed her pulmonary function testing with her today.  There are some slight curve to her flow-volume loop but nothing convincing for asthma.  The gross numbers are normal.  Reassured her about this.  I think her chronic cough has been due to some upper airway irritability that is difficult to resolve when she has viral URI.  She is  asymptomatic currently.  Talked to her in detail about handwashing, wearing a mask to avoid catching viral infection this fall and while traveling.  She is going to [place] on a mission trip in November.   We reviewed your pulmonary function testing today.  These show normal airflows without any convincing evidence to support asthma.  This is good news.  We do not need to start any inhaler medication at this time. Please continue your Nexium  as you have been taking it If your congestion and drainage increases please consider starting loratadine 10 mg once daily (generic Claritin). We will give you a prescription for Tessalon Perles to take with you while you are traveling in case you need it for cough suppression you can use 100 mg up to every 6 hours if needed for cough We will give you a prescription for azithromycin  to take with you while you are traveling in case you develop cough with colored mucus consistent with a bronchitis. Please follow with Dr. Shelah as needed depending on how your cough is doing.   Lamar Shelah, MD, PhD 02/16/2024, 9:20 AM [facility] [phone] or if no answer before 7:00PM call [phone] For any issues after 7:00PM please call eLink [phone]

## 2024-02-16 NOTE — Patient Instructions (Signed)
 We reviewed your pulmonary function testing today.  These show normal airflows without any convincing evidence to support asthma.  This is good news.  We do not need to start any inhaler medication at this time. Please continue your Nexium  as you have been taking it If your congestion and drainage increases please consider starting loratadine 10 mg once daily (generic Claritin). We will give you a prescription for Tessalon Perles to take with you while you are traveling in case you need it for cough suppression you can use 100 mg up to every 6 hours if needed for cough We will give you a prescription for azithromycin  to take with you while you are traveling in case you develop cough with colored mucus consistent with a bronchitis. Please follow with Dr. Shelah as needed depending on how your cough is doing.

## 2024-02-16 NOTE — Assessment & Plan Note (Signed)
 Reviewed her pulmonary function testing with her today.  There are some slight curve to her flow-volume loop but nothing convincing for asthma.  The gross numbers are normal.  Reassured her about this.  I think her chronic cough has been due to some upper airway irritability that is difficult to resolve when she has viral URI.  She is asymptomatic currently.  Talked to her in detail about handwashing, wearing a mask to avoid catching viral infection this fall and while traveling.  She is going to [place] on a mission trip in November.   We reviewed your pulmonary function testing today.  These show normal airflows without any convincing evidence to support asthma.  This is good news.  We do not need to start any inhaler medication at this time. Please continue your Nexium  as you have been taking it If your congestion and drainage increases please consider starting loratadine 10 mg once daily (generic Claritin). We will give you a prescription for Tessalon Perles to take with you while you are traveling in case you need it for cough suppression you can use 100 mg up to every 6 hours if needed for cough We will give you a prescription for azithromycin  to take with you while you are traveling in case you develop cough with colored mucus consistent with a bronchitis. Please follow with Dr. Shelah as needed depending on how your cough is doing.

## 2024-02-19 ENCOUNTER — Telehealth: Payer: Self-pay | Admitting: Internal Medicine

## 2024-02-19 ENCOUNTER — Other Ambulatory Visit: Payer: Self-pay | Admitting: Internal Medicine

## 2024-02-19 NOTE — Telephone Encounter (Signed)
 Inbound call from patient stating that her pharmacy has denied her refill on her Esomeprazole . Patient is requesting to call back to find out why her medication was denied. Please advise.

## 2024-02-19 NOTE — Telephone Encounter (Signed)
 I spoke with the [facility] and they are getting it ready for her at the [facility] on [address] and [address] so she will go tomorrow to pick it up. She request I take that pharmacy out as she goes to [facility] now.

## 2024-02-23 DIAGNOSIS — M1711 Unilateral primary osteoarthritis, right knee: Secondary | ICD-10-CM | POA: Diagnosis not present

## 2024-04-18 ENCOUNTER — Ambulatory Visit: Admitting: Internal Medicine

## 2024-04-18 VITALS — BP 124/62 | HR 75 | Wt 153.0 lb

## 2024-04-18 DIAGNOSIS — K219 Gastro-esophageal reflux disease without esophagitis: Secondary | ICD-10-CM

## 2024-04-18 DIAGNOSIS — K30 Functional dyspepsia: Secondary | ICD-10-CM

## 2024-04-18 NOTE — Patient Instructions (Signed)
 Great to see you today!  _______________________________________________________  If your blood pressure at your visit was 140/90 or greater, please contact your primary care physician to follow up on this.  _______________________________________________________  If you are age 72 or older, your body mass index should be between 23-30. Your Body mass index is 27.98 kg/m. If this is out of the aforementioned range listed, please consider follow up with your Primary Care Provider.  If you are age 87 or younger, your body mass index should be between 19-25. Your Body mass index is 27.98 kg/m. If this is out of the aformentioned range listed, please consider follow up with your Primary Care Provider.   ________________________________________________________  The [facility] providers would like to encourage you to use MYCHART to communicate with providers for non-urgent requests or questions.  Due to long hold times on the telephone, sending your provider a message by [facility] may be a faster and more efficient way to get a response.  Please allow 48 business hours for a response.  Please remember that this is for non-urgent requests.  _______________________________________________________  [facility] is using a team-based approach to care.  Your team is made up of your doctor and two to three APPS. Our APPS (Nurse Practitioners and Physician Assistants) work with your physician to ensure care continuity for you. They are fully qualified to address your health concerns and develop a treatment plan. They communicate directly with your gastroenterologist to care for you. Seeing the Advanced Practice Practitioners on your physician's team can help you by facilitating care more promptly, often allowing for earlier appointments, access to diagnostic testing, procedures, and other specialty referrals.   I appreciate the opportunity to care for you. Lupita Commander, MD, [facility]

## 2024-04-18 NOTE — Progress Notes (Signed)
 [name] 73 y.o. 1950/07/18 [id number]  Assessment & Plan:   Encounter Diagnoses  Name Primary?   Functional dyspepsia Yes   Gastroesophageal reflux disease, unspecified whether esophagitis present     Continue daily Nexium .  Return in 1 year or sooner if needed.  Subjective:   Chief Complaint: Reflux and dyspepsia follow-up  HPI 73 year old [place] woman with a long history of functional dyspepsia, prior duodenal adenoma, and fundic gland stomach polyps  Last EGD Aug 30, 2021, benign fundic gland polyps, duodenal polyp gone.  Otherwise slight mucosal erythema in the stomach which had previously been biopsied and was normal.  Overall normal exam except for the polyps.  Last seen in January 2025, and wanted to try to stop Nexium .  I advised to taper she tried that but she has been unable to stop it so she continues on Nexium  40 mg daily.  No dysphagia.  Otherwise doing well.  She did have some chronic cough issues which resolved.  Last seen in pulmonary by Dr. Shelah in October, note reviewed.  Labs in March with normal TSH CBC BMP and liver chemistries.  Colonoscopy 2018 distal hyperplastic polyp, recall 2028 Wt Readings from Last 3 Encounters:  04/18/24 153 lb (69.4 kg)  02/16/24 153 lb 6.4 oz (69.6 kg)  12/06/23 156 lb 3.2 oz (70.9 kg)    Allergies[1] Active Medications[2] Past Medical History:  Diagnosis Date   Allergy    Anemia    Arthritis    Atherosclerosis of other specified arteries    Bilateral carpal tunnel syndrome    Cataract    GERD (gastroesophageal reflux disease)    Hemorrhoids    Hiatal hernia    Hypertension    IBS (irritable bowel syndrome)    Low back pain    Osteopenia    Renal disorder    UTI (lower urinary tract infection)    Venous insufficiency    Past Surgical History:  Procedure Laterality Date   ABDOMINAL HYSTERECTOMY     bilateral carpal tunnel release  01/2008 and 06/2005   Dr. Leonor   BREAST REDUCTION SURGERY  1999    COLONOSCOPY  12/29/2016   per Dr. Avram, hemorrhoids only, no polyps, repeat in 10 yrs    ENDOVENOUS ABLATION SAPHENOUS VEIN W/ LASER  12-15-2011   right greater saphenous vein by Krystal Doing MD   ENDOVENOUS ABLATION SAPHENOUS VEIN W/ LASER  01-26-2012   left greater saphenous vein by Krystal Doing MD   ESOPHAGOGASTRODUODENOSCOPY  12/29/2016   per Dr. Avram, gastritis    ESOPHAGOGASTRODUODENOSCOPY (EGD) WITH PROPOFOL  N/A 08/10/2020   Procedure: ESOPHAGOGASTRODUODENOSCOPY (EGD) WITH PROPOFOL ;  Surgeon: Avram Lupita BRAVO, MD;  Location: [facility];  Service: Endoscopy;  Laterality: N/A;   hemorrhoid sugery  04/2009   Dr. Gail   left cataract and lens  07/2008   Dr. 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   Review of Systems As per HPI  Objective:   Physical Exam BP 124/62   Pulse 75   Wt 153 lb (69.4 kg)   BMI 27.98 kg/m      [1]  Allergies Allergen Reactions   Keflex  [Cephalexin ] Anaphylaxis    It made it hard to breathe.    Sulfamethoxazole-Trimethoprim Shortness Of Breath    flu like symptoms   Tramadol  Other (See Comments)    Insomnia   Lansoprazole     REACTION: headaches   Levaquin  [Levofloxacin  In D5w] Nausea And Vomiting   Macrobid  [Nitrofurantoin  Macrocrystal] Other (See Comments)    Fever Chills and Body aches   Shellfish Allergy    Penicillins Rash    Has patient had a PCN  reaction causing immediate rash, facial/tongue/throat swelling, SOB or lightheadedness with hypotension: No Has patient had a PCN reaction causing severe rash involving mucus membranes or skin necrosis: No Has patient had a PCN reaction that required hospitalization No Has patient had a PCN reaction occurring within the last 10 years: No If all of the above answers are NO, then may proceed with Cephalosporin use.   [2]  Current Meds  Medication Sig   amLODipine  (NORVASC ) 5 MG tablet TAKE 1 TABLET(5 MG) BY MOUTH DAILY   azithromycin  (ZITHROMAX ) 250 MG tablet Take 2 on the first day, then 1 daily until completely gone   b complex vitamins capsule Take 1 capsule by mouth daily.   benzonatate  (TESSALON ) 100 MG capsule Take 1 capsule (100 mg total) by mouth every 6 (six) hours as needed for cough.   Cholecalciferol (VITAMIN D ) 50 MCG (2000 UT) CAPS Take 2,000 Units by mouth daily.   esomeprazole  (NEXIUM ) 40 MG capsule TAKE 1 CAPSULE BY MOUTH TWICE DAILY BEFORE A MEAL (Patient taking differently: Take 40 mg by mouth daily.)   estradiol (ESTRACE) 0.1 MG/GM vaginal cream Place 1 Applicatorful vaginally once a week.   KRILL OIL PO Take 1 tablet by mouth daily.   losartan  (COZAAR ) 50 MG tablet Take 1 tablet (50 mg total) by mouth daily.   metoprolol  succinate (TOPROL -XL) 100 MG 24 hr tablet TAKE 1 TABLET BY MOUTH TWICE  DAILY WITH OR IMMEDIATELY  FOLLOWING MEAL   Probiotic Product (PROBIOTIC DAILY PO) Take 1 tablet by mouth daily.   valACYclovir (VALTREX) 500 MG tablet Take 500 mg by mouth 2 (two) times daily as needed (fever blisters).

## 2024-05-04 ENCOUNTER — Other Ambulatory Visit: Payer: Self-pay | Admitting: Family Medicine

## 2024-05-14 ENCOUNTER — Other Ambulatory Visit: Payer: Self-pay | Admitting: Internal Medicine
